# Patient Record
Sex: Female | Born: 1995
Health system: Southern US, Community
[De-identification: ages and names within clinical notes are randomized; demographics above are authoritative.]

## PROBLEM LIST (undated history)

## (undated) ENCOUNTER — Inpatient Hospital Stay (HOSPITAL_COMMUNITY): Payer: Self-pay

## (undated) DIAGNOSIS — F329 Major depressive disorder, single episode, unspecified: Secondary | ICD-10-CM

## (undated) DIAGNOSIS — F32A Depression, unspecified: Secondary | ICD-10-CM

## (undated) DIAGNOSIS — N390 Urinary tract infection, site not specified: Secondary | ICD-10-CM

## (undated) DIAGNOSIS — F419 Anxiety disorder, unspecified: Secondary | ICD-10-CM

## (undated) DIAGNOSIS — Z87448 Personal history of other diseases of urinary system: Secondary | ICD-10-CM

## (undated) DIAGNOSIS — E669 Obesity, unspecified: Secondary | ICD-10-CM

## (undated) DIAGNOSIS — E079 Disorder of thyroid, unspecified: Secondary | ICD-10-CM

## (undated) DIAGNOSIS — U071 COVID-19: Secondary | ICD-10-CM

## (undated) HISTORY — DX: Personal history of other diseases of urinary system: Z87.448

## (undated) HISTORY — DX: Anxiety disorder, unspecified: F41.9

## (undated) HISTORY — DX: Depression, unspecified: F32.A

## (undated) HISTORY — PX: NO PAST SURGERIES: SHX2092

## (undated) HISTORY — DX: Obesity, unspecified: E66.9

## (undated) HISTORY — PX: ORAL MUCOCELE EXCISION: SHX2111

## (undated) HISTORY — DX: Urinary tract infection, site not specified: N39.0

## (undated) HISTORY — DX: Disorder of thyroid, unspecified: E07.9

## (undated) HISTORY — DX: Major depressive disorder, single episode, unspecified: F32.9

## (undated) HISTORY — DX: COVID-19: U07.1

---
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
## 2017-03-20 DIAGNOSIS — Z720 Tobacco use: Secondary | ICD-10-CM | POA: Insufficient documentation

## 2018-07-22 ENCOUNTER — Encounter: Payer: Self-pay | Admitting: *Deleted

## 2018-07-23 ENCOUNTER — Other Ambulatory Visit: Payer: Self-pay

## 2018-07-23 ENCOUNTER — Other Ambulatory Visit (HOSPITAL_COMMUNITY)
Admission: RE | Admit: 2018-07-23 | Discharge: 2018-07-23 | Disposition: A | Discharge status: Home / Self Care | Payer: 59 | Source: Ambulatory Visit | Attending: Family Medicine | Admitting: Family Medicine

## 2018-07-23 ENCOUNTER — Ambulatory Visit: Payer: 59 | Admitting: Family Medicine

## 2018-07-23 ENCOUNTER — Encounter: Payer: Self-pay | Admitting: Family Medicine

## 2018-07-23 VITALS — BP 116/70 | HR 93 | Temp 98.1°F | Resp 16 | Ht 63.0 in | Wt 199.0 lb

## 2018-07-23 DIAGNOSIS — E669 Obesity, unspecified: Secondary | ICD-10-CM | POA: Diagnosis not present

## 2018-07-23 DIAGNOSIS — N390 Urinary tract infection, site not specified: Secondary | ICD-10-CM | POA: Diagnosis not present

## 2018-07-23 DIAGNOSIS — F17210 Nicotine dependence, cigarettes, uncomplicated: Secondary | ICD-10-CM | POA: Insufficient documentation

## 2018-07-23 DIAGNOSIS — N760 Acute vaginitis: Secondary | ICD-10-CM | POA: Diagnosis not present

## 2018-07-23 DIAGNOSIS — B9689 Other specified bacterial agents as the cause of diseases classified elsewhere: Secondary | ICD-10-CM

## 2018-07-23 DIAGNOSIS — Z87448 Personal history of other diseases of urinary system: Secondary | ICD-10-CM | POA: Diagnosis not present

## 2018-07-23 DIAGNOSIS — N898 Other specified noninflammatory disorders of vagina: Secondary | ICD-10-CM

## 2018-07-23 HISTORY — DX: Urinary tract infection, site not specified: N39.0

## 2018-07-23 HISTORY — DX: Nicotine dependence, cigarettes, uncomplicated: F17.210

## 2018-07-23 HISTORY — DX: Personal history of other diseases of urinary system: Z87.448

## 2018-07-23 HISTORY — DX: Obesity, unspecified: E66.9

## 2018-07-23 NOTE — Patient Instructions (Signed)
Please return in 1-6 months for your annual complete physical; please come fasting.  I have placed a referral to OB/GYN for further evaluation regarding recurrent vaginal discharge, and female wellness care including contraceptive management and pap smear.   I will release your lab results to you on your MyChart account with further instructions. Please reply with any questions.   We recommend condom use to prevent STIs.   It was a pleasure meeting you today! Thank you for choosing us to meet your healthcare needs! I truly look forward to working with you. If you have any questions or concerns, please send me a message via Mychart or call the office at [phone].   Steps to Quit Smoking  Smoking tobacco can be harmful to your health and can affect almost every organ in your body. Smoking puts you, and those around you, at risk for developing many serious chronic diseases. Quitting smoking is difficult, but it is one of the best things that you can do for your health. It is never too late to quit. What are the benefits of quitting smoking? When you quit smoking, you lower your risk of developing serious diseases and conditions, such as:  Lung cancer or lung disease, such as COPD.  Heart disease.  Stroke.  Heart attack.  Infertility.  Osteoporosis and bone fractures. Additionally, symptoms such as coughing, wheezing, and shortness of breath may get better when you quit. You may also find that you get sick less often because your body is stronger at fighting off colds and infections. If you are pregnant, quitting smoking can help to reduce your chances of having a baby of low birth weight. How do I get ready to quit? When you decide to quit smoking, create a plan to make sure that you are successful. Before you quit:  Pick a date to quit. Set a date within the next two weeks to give you time to prepare.  Write down the reasons why you are quitting. Keep this list in places where you  will see it often, such as on your bathroom mirror or in your car or wallet.  Identify the people, places, things, and activities that make you want to smoke (triggers) and avoid them. Make sure to take these actions: ? Throw away all cigarettes at home, at work, and in your car. ? Throw away smoking accessories, such as Set designerashtrays and lighters. ? Clean your car and make sure to empty the ashtray. ? Clean your home, including curtains and carpets.  Tell your family, friends, and coworkers that you are quitting. Support from your loved ones can make quitting easier.  Talk with your health care provider about your options for quitting smoking.  Find out what treatment options are covered by your health insurance. What strategies can I use to quit smoking? Talk with your healthcare provider about different strategies to quit smoking. Some strategies include:  Quitting smoking altogether instead of gradually lessening how much you smoke over a period of time. Research shows that quitting "cold [place]urkey" is more successful than gradually quitting.  Attending in-person counseling to help you build problem-solving skills. You are more likely to have success in quitting if you attend several counseling sessions. Even short sessions of 10 minutes can be effective.  Finding resources and support systems that can help you to quit smoking and remain smoke-free after you quit. These resources are most helpful when you use them often. They can include: ? Online chats with a Veterinary surgeoncounselor. ? Telephone  quitlines. ? Automotive engineer. ? Support groups or group counseling. ? Text messaging programs. ? Mobile phone applications.  Taking medicines to help you quit smoking. (If you are pregnant or breastfeeding, talk with your health care provider first.) Some medicines contain nicotine and some do not. Both types of medicines help with cravings, but the medicines that include nicotine help to relieve  withdrawal symptoms. Your health care provider may recommend: ? Nicotine patches, gum, or lozenges. ? Nicotine inhalers or sprays. ? Non-nicotine medicine that is taken by mouth. Talk with your health care provider about combining strategies, such as taking medicines while you are also receiving in-person counseling. Using these two strategies together makes you more likely to succeed in quitting than if you used either strategy on its own. If you are pregnant or breastfeeding, talk with your health care provider about finding counseling or other support strategies to quit smoking. Do not take medicine to help you quit smoking unless told to do so by your health care provider. What things can I do to make it easier to quit? Quitting smoking might feel overwhelming at first, but there is a lot that you can do to make it easier. Take these important actions:  Reach out to your family and friends and ask that they support and encourage you during this time. Call telephone quitlines, reach out to support groups, or work with a counselor for support.  Ask people who smoke to avoid smoking around you.  Avoid places that trigger you to smoke, such as bars, parties, or smoke-break areas at work.  Spend time around people who do not smoke.  Lessen stress in your life, because stress can be a smoking trigger for some people. To lessen stress, try: ? Exercising regularly. ? Deep-breathing exercises. ? Yoga. ? Meditating. ? Performing a body scan. This involves closing your eyes, scanning your body from head to toe, and noticing which parts of your body are particularly tense. Purposefully relax the muscles in those areas.  Download or purchase mobile phone or tablet apps (applications) that can help you stick to your quit plan by providing reminders, tips, and encouragement. There are many free apps, such as QuitGuide from the [employer] developer for Disease Control and Prevention). You can find other support  for quitting smoking (smoking cessation) through smokefree.gov and other websites. How will I feel when I quit smoking? Within the first 24 hours of quitting smoking, you may start to feel some withdrawal symptoms. These symptoms are usually most noticeable 2-3 days after quitting, but they usually do not last beyond 2-3 weeks. Changes or symptoms that you might experience include:  Mood swings.  Restlessness, anxiety, or irritation.  Difficulty concentrating.  Dizziness.  Strong cravings for sugary foods in addition to nicotine.  Mild weight gain.  Constipation.  Nausea.  Coughing or a sore throat.  Changes in how your medicines work in your body.  A depressed mood.  Difficulty sleeping (insomnia). After the first 2-3 weeks of quitting, you may start to notice more positive results, such as:  Improved sense of smell and taste.  Decreased coughing and sore throat.  Slower heart rate.  Lower blood pressure.  Clearer skin.  The ability to breathe more easily.  Fewer sick days. Quitting smoking is very challenging for most people. Do not get discouraged if you are not successful the first time. Some people need to make many attempts to quit before they achieve long-term success. Do your best to stick to  your quit plan, and talk with your health care provider if you have any questions or concerns. This information is not intended to replace advice given to you by your health care provider. Make sure you discuss any questions you have with your health care provider. Document Released: 05/28/2001 Document Revised: 01/07/2017 Document Reviewed: 10/18/2014 Elsevier Interactive Patient Education  2019 [employer]

## 2018-07-23 NOTE — Progress Notes (Signed)
Subjective  CC:  Chief Complaint  Patient presents with  . Establish Care    HPI: Barbara Lutz is a 23 y.o. female who presents to [place] at [facility] today to establish care with me as a new patient.  Previously was treated by peds and OB/GYN. I reviewed those records.   She has the following concerns or needs:  Recurrent vaginitis and h/o frequent UTIs. Reports had BV treated by GYN - multiple episodes over the last year. Last treated in July. No documented STIs last year but reports being told she had trich in the distant past although she is not certain that is the correct dx. She has had neg STD testing in the last 6 months. C/o several week h/o increase in vaginal discharge with odor. No itching. Mild urinary sxs. One sexual partner, no condoms. On birth control. No pelvic pain, fevers, or h/o PID.   Smoker not interested in quitting right now.   Due for HM / cpe.   Assessment  1. Vaginal discharge   2. Recurrent UTI   3. History of pyelonephritis   4. Nicotine dependence, cigarettes, uncomplicated   5. Obesity (BMI 30-39.9)   6. Bacterial vaginitis      Plan   H/o recurrent BV with BV sxs now: check vaginal swab for trich, yeast and BV, GC/chl. Treat if positive. Refer to GYN   Pt request [facility] in [place] due to location. Safe sex discussed.   R/o UTI although sxs c/w vaginitis.   Smoking: will work on cessation counseling. See avs.   Obesity: has been to bariatric and nutritional counseling: "has tried everything" currently seeing private obesity clinic.   Follow up:  Return in about 3 months (around 10/21/2018) for complete physical. Orders Placed This Encounter  Procedures  . Ambulatory referral to Obstetrics / Gynecology   No orders of the defined types were placed in this encounter.    No flowsheet data found.  We updated and reviewed the patient's past history in detail and it is documented below.  Patient Active  Problem List   Diagnosis Date Noted  . Recurrent UTI 07/23/2018  . History of pyelonephritis 07/23/2018  . Nicotine dependence, cigarettes, uncomplicated 60/60/0459  . Obesity (BMI 30-39.9) 07/23/2018    [facility] bariatric counseling   . Tobacco abuse 03/20/2017  . Depression, major, recurrent, moderate ([place]) 05/25/2012   Health Maintenance  Topic Date Due  . HIV Screening  01/30/2011  . PAP-Cervical Cytology Screening  03/18/2019  . PAP SMEAR-Modifier  03/18/2019  . TETANUS/TDAP  03/19/2027  . INFLUENZA VACCINE  Completed   Immunization History  Administered Date(s) Administered  . DTaP 03/19/1996, 05/17/1996, 08/03/1996, 08/19/1997, 09/25/2000  . DTaP / HiB 03/19/1996, 05/17/1996, 08/03/1996, 02/01/1997  . HPV Quadrivalent 03/30/2009, 03/07/2010, 07/03/2010  . Hepatitis A, Ped/Adol-2 Dose 03/10/2008, 03/30/2009  . Hepatitis B, ped/adol Feb 25, 1996, 03/19/1996, 11/25/1996  . IPV 03/19/1996, 05/17/1996, 08/19/1997, 09/25/2000  . Influenza Inj Mdck Quad Pf 02/15/2018  . Influenza, Seasonal, Injecte, Preservative Fre 04/19/2016  . Influenza,inj,Quad PF,6+ Mos 04/19/2016, 04/19/2016, 03/18/2017  . Influenza-Unspecified 03/10/2008, 03/30/2009, 03/07/2010  . MMR 02/01/1997, 09/25/2000  . Meningococcal Conjugate 03/10/2008, 02/14/2014  . Pneumococcal-Unspecified 07/09/1999  . Td 02/06/2007, 03/18/2017  . Tdap 02/06/2007  . Varicella 02/01/1997, 03/10/2008   Current Meds  Medication Sig  . Chorionic Gonadotropin (HCG IJ) Inject as directed.  . Norgestimate-Ethinyl Estradiol Triphasic (TRI-ESTARYLLA) 0.18/0.215/0.25 MG-35 MCG tablet Take by mouth.  . phentermine 37.5 MG capsule Take 37.5 mg by mouth  every morning.    Allergies: Patient is allergic to latex and nickel. Past Medical History Patient  has a past medical history of Anxiety, Depression, History of pyelonephritis (07/23/2018), Obesity (BMI 30-39.9) (07/23/2018), and Recurrent UTI (07/23/2018). Past Surgical History Patient  has  no past surgical history on file. Family History: Patient family history includes ADD / ADHD in her paternal grandfather; Alcohol abuse in her paternal grandfather; Arthritis in her paternal grandmother; Asthma in her sister; Breast cancer in her maternal grandmother; COPD in her paternal grandfather; Depression in her sister and sister; Diabetes in her maternal grandfather and paternal grandfather; Drug abuse in her sister; Hearing loss in her maternal grandfather and paternal grandfather; Heart attack in her father and maternal grandfather; Heart disease in her maternal grandfather; High Cholesterol in her father; Hyperlipidemia in her maternal grandfather and paternal grandfather; Hypertension in her maternal grandfather, paternal grandfather, and paternal grandmother; Pancreatic cancer in her paternal grandmother; Stroke in her paternal grandfather. Social History:  Patient  reports that she has never smoked. She has never used smokeless tobacco. She reports that she does not drink alcohol or use drugs.  Review of Systems: Constitutional: negative for fever or malaise Ophthalmic: negative for photophobia, double vision or loss of vision Cardiovascular: negative for chest pain, dyspnea on exertion, or new LE swelling Respiratory: negative for SOB or persistent cough Gastrointestinal: negative for abdominal pain, change in bowel habits or melena Genitourinary: negative for dysuria or gross hematuria Musculoskeletal: negative for new gait disturbance or muscular weakness Integumentary: negative for new or persistent rashes Neurological: negative for TIA or stroke symptoms Psychiatric: negative for SI or delusions Allergic/Immunologic: negative for hives  Patient Care Team    Relationship Specialty Notifications Start End  Leamon Arnt, MD PCP - General Family Medicine  07/23/18 07/23/18    Objective  Vitals: BP 116/70   Pulse 93   Temp 98.1 F (36.7 C) (Oral)   Resp 16   Ht '5\' 3"'  (1.6  m)   Wt 199 lb (90.3 kg)   LMP 07/09/2018   SpO2 97%   BMI 35.25 kg/m  General:  Well developed, well nourished, no acute distress  Psych:  Alert and oriented,normal mood and affect HEENT:  Normocephalic, atraumatic, non-icteric sclera, PERRL, oropharynx is without mass or exudate, supple neck without adenopathy, mass or thyromegaly Cardiovascular:  RRR without gallop, rub or murmur, nondisplaced PMI Respiratory:  Good breath sounds bilaterally, CTAB with normal respiratory effort Gastrointestinal: normal bowel sounds, soft, non-tender, no noted masses. No HSM, no suprapubic ttp MSK: no deformities, contusions. Joints are without erythema or swelling Skin:  Warm, no rashes or suspicious lesions noted Neurologic:    Mental status is normal. Gross motor and sensory exams are normal. Normal gait   Commons side effects, risks, benefits, and alternatives for medications and treatment plan prescribed today were discussed, and the patient expressed understanding of the given instructions. Patient is instructed to call or message via MyChart if he/she has any questions or concerns regarding our treatment plan. No barriers to understanding were identified. We discussed Red Flag symptoms and signs in detail. Patient expressed understanding regarding what to do in case of urgent or emergency type symptoms.   Medication list was reconciled, printed and provided to the patient in AVS. Patient instructions and summary information was reviewed with the patient as documented in the AVS. This note was prepared with assistance of Dragon voice recognition software. Occasional wrong-word or sound-a-like substitutions may have occurred due to the inherent  limitations of voice recognition software

## 2018-07-26 LAB — CERVICOVAGINAL ANCILLARY ONLY
Bacterial vaginitis: NEGATIVE
CANDIDA VAGINITIS: NEGATIVE
Chlamydia: NEGATIVE
NEISSERIA GONORRHEA: NEGATIVE
TRICH (WINDOWPATH): NEGATIVE

## 2018-07-27 ENCOUNTER — Encounter: Payer: Self-pay | Admitting: *Deleted

## 2018-07-27 NOTE — Progress Notes (Signed)
Please call patient: I have reviewed his/her lab results. All vaginal testing for STIs and BV and yeast were negative. No infection identified. See GYN as discussed. No medications needed at this time.

## 2018-08-06 DIAGNOSIS — R002 Palpitations: Secondary | ICD-10-CM | POA: Diagnosis not present

## 2018-08-06 DIAGNOSIS — E041 Nontoxic single thyroid nodule: Secondary | ICD-10-CM | POA: Diagnosis not present

## 2018-08-06 DIAGNOSIS — R079 Chest pain, unspecified: Secondary | ICD-10-CM | POA: Diagnosis not present

## 2018-08-28 ENCOUNTER — Encounter: Payer: 59 | Admitting: Family Medicine

## 2018-09-10 ENCOUNTER — Encounter: Payer: 59 | Admitting: Family Medicine

## 2018-10-26 ENCOUNTER — Ambulatory Visit: Payer: 59 | Admitting: Family Medicine

## 2018-12-10 DIAGNOSIS — Z792 Long term (current) use of antibiotics: Secondary | ICD-10-CM | POA: Diagnosis not present

## 2018-12-10 DIAGNOSIS — J020 Streptococcal pharyngitis: Secondary | ICD-10-CM | POA: Diagnosis not present

## 2019-01-22 ENCOUNTER — Encounter: Payer: 59 | Admitting: Family Medicine

## 2019-03-22 DIAGNOSIS — Z23 Encounter for immunization: Secondary | ICD-10-CM | POA: Diagnosis not present

## 2019-03-22 DIAGNOSIS — J029 Acute pharyngitis, unspecified: Secondary | ICD-10-CM | POA: Diagnosis not present

## 2019-03-22 DIAGNOSIS — J0100 Acute maxillary sinusitis, unspecified: Secondary | ICD-10-CM | POA: Diagnosis not present

## 2019-03-24 ENCOUNTER — Other Ambulatory Visit: Payer: Self-pay

## 2019-03-24 DIAGNOSIS — Z20822 Contact with and (suspected) exposure to covid-19: Secondary | ICD-10-CM

## 2019-03-24 DIAGNOSIS — Z20828 Contact with and (suspected) exposure to other viral communicable diseases: Secondary | ICD-10-CM | POA: Diagnosis not present

## 2019-03-25 LAB — NOVEL CORONAVIRUS, NAA: SARS-CoV-2, NAA: NOT DETECTED

## 2019-07-18 DIAGNOSIS — Z1152 Encounter for screening for COVID-19: Secondary | ICD-10-CM | POA: Diagnosis not present

## 2019-07-20 DIAGNOSIS — J029 Acute pharyngitis, unspecified: Secondary | ICD-10-CM | POA: Diagnosis not present

## 2019-07-21 ENCOUNTER — Ambulatory Visit (INDEPENDENT_AMBULATORY_CARE_PROVIDER_SITE_OTHER): Payer: 59 | Admitting: Family Medicine

## 2019-07-21 ENCOUNTER — Encounter: Payer: Self-pay | Admitting: Family Medicine

## 2019-07-21 ENCOUNTER — Other Ambulatory Visit: Payer: Self-pay

## 2019-07-21 VITALS — Ht 63.0 in | Wt 199.0 lb

## 2019-07-21 DIAGNOSIS — J029 Acute pharyngitis, unspecified: Secondary | ICD-10-CM | POA: Diagnosis not present

## 2019-07-21 MED ORDER — PENICILLIN V POTASSIUM 500 MG PO TABS: 500.0000 mg | ORAL_TABLET | Freq: Two times a day (BID) | ORAL | 0 refills | Status: DC

## 2019-07-21 NOTE — Progress Notes (Signed)
Virtual Visit via Video Note  Subjective  CC:  Chief Complaint  Patient presents with  . Sore Throat    Sx started Sunday 07/18/2019, throat is red/ white patches. Negative test Sundy at [facility]. 101 fever  . Cough  . Fever     I connected with [facility] on 07/21/19 at 10:00 AM EST by a video enabled telemedicine application and verified that I am speaking with the correct person using two identifiers. Location patient: Home Location provider: [facility] [address], Office Persons participating in the virtual visit: Shaylon Gillean, Willow Ora, MD Gracy Racer, [place]  I discussed the limitations of evaluation and management by telemedicine and the availability of in person appointments. The patient expressed understanding and agreed to proceed. HPI: Barbara Lutz is a 24 y.o. female who was contacted today to address the problems listed above in the chief complaint. . Reviewed chart: seen at [facility] yesterday. Negative rapid strep at that time. 3 days prior had negative covid test done at [facility] urgent care. This may have been a rapid test. She has not had the NAA test done. . C/o low grade fevers, severe sore throat x 4 days w/o cough, loss of taste or smell, GI sxs or myalgias. No known covid exposures. Had had strep recurrent in past and feels the same. Using tylenol for pain w/o relief.   Assessment  1. Acute pharyngitis, unspecified etiology      Plan   ST:  ? False neg rapid strep vs viral etiology. Treat with advil 3/tid and supportive care. OOW note sent in mychart. Empiric tx w/ pcn. F/u if not improving. Self isolation until improved I discussed the assessment and treatment plan with the patient. The patient was provided an opportunity to ask questions and all were answered. The patient agreed with the plan and demonstrated an understanding of the instructions.   The patient was advised to call back or seek an in-person evaluation if  the symptoms worsen or if the condition fails to improve as anticipated. Follow up: Return for complete physical.  Visit date not found  Meds ordered this encounter  Medications  . penicillin v potassium (VEETID) 500 MG tablet    Sig: Take 1 tablet (500 mg total) by mouth 2 (two) times daily.    Dispense:  20 tablet    Refill:  0      I reviewed the patients updated PMH, FH, and SocHx.    Patient Active Problem List   Diagnosis Date Noted  . Recurrent UTI 07/23/2018  . History of pyelonephritis 07/23/2018  . Nicotine dependence, cigarettes, uncomplicated 07/23/2018  . Obesity (BMI 30-39.9) 07/23/2018  . Tobacco abuse 03/20/2017  . Depression, major, recurrent, moderate ([facility]) 05/25/2012   No outpatient medications have been marked as taking for the 07/21/19 encounter (Office Visit) with Willow Ora, MD.    Allergies: Patient is allergic to latex and nickel. Family History: Patient family history includes ADD / ADHD in her paternal grandfather; Alcohol abuse in her paternal grandfather; Arthritis in her paternal grandmother; Asthma in her sister; Breast cancer in her maternal grandmother; COPD in her paternal grandfather; Depression in her sister and sister; Diabetes in her maternal grandfather and paternal grandfather; Drug abuse in her sister; Hearing loss in her maternal grandfather and paternal grandfather; Heart attack in her father and maternal grandfather; Heart disease in her maternal grandfather; High Cholesterol in her father; Hyperlipidemia in her maternal grandfather and  paternal grandfather; Hypertension in her maternal grandfather, paternal grandfather, and paternal grandmother; Pancreatic cancer in her paternal grandmother; Stroke in her paternal grandfather. Social History:  Patient  reports that she has never smoked. She has never used smokeless tobacco. She reports that she does not drink alcohol or use drugs.  Review of Systems: Constitutional: Negative for fever  malaise or anorexia Cardiovascular: negative for chest pain Respiratory: negative for SOB or persistent cough Gastrointestinal: negative for abdominal pain  OBJECTIVE Vitals: Ht 5\' 3"  (1.6 m)   Wt 199 lb (90.3 kg)   LMP 07/16/2019   BMI 35.25 kg/m  General: no acute distress , A&Ox3 Hoarse but no respiratory distress. Appears painful to swallow  Leamon Arnt, MD

## 2019-07-21 NOTE — Patient Instructions (Signed)
Please return for your annual complete physical; please come fasting.   If you have any questions or concerns, please don't hesitate to send me a message via MyChart or call the office at [phone]. Thank you for visiting with [place] today! It's our pleasure caring for you.   Strep Throat, Adult Strep throat is an infection in the throat that is caused by bacteria. It is common during the cold months of the year. It mostly affects children who are 71-108 years old. However, people of all ages can get it at any time of the year. This infection spreads from person to person (is contagious) through coughing, sneezing, or having close contact. Your health care provider may use other names to describe the infection. It can be called tonsillitis (if there is swelling of the tonsils), or pharyngitis (if there is swelling at the back of the throat). What are the causes? This condition is caused by the Streptococcus pyogenes bacteria. What increases the risk? You are more likely to develop this condition if:  You care for school-age children, or are around school-age children. Children are more likely to get strep throat and may spread it to others.  You spend time in crowded places where the infection can spread easily.  You have close contact with someone who has strep throat. What are the signs or symptoms? Symptoms of this condition include:  Fever or chills.  Redness, swelling, or pain in the tonsils or throat.  Pain or difficulty when swallowing.  White or yellow spots on the tonsils or throat.  Tender glands in the neck and under the jaw.  Bad smelling breath.  Red rash all over the body. This is rare. How is this diagnosed? This condition is diagnosed by tests that check for the presence and the amount of bacteria that cause strep throat. They are:  Rapid strep test. Your throat is swabbed and checked for the presence of bacteria. Results are usually ready in minutes.  Throat  culture test. Your throat is swabbed. The sample is placed in a cup that allows infections to grow. Results are usually ready in 1 or 2 days. How is this treated? This condition may be treated with:  Medicines that kill germs (antibiotics).  Medicines that relieve pain or fever. These include: ? Ibuprofen or acetaminophen. ? Aspirin, only for patients who are over the age of 67. ? Throat lozenges. ? Throat sprays. Follow these instructions at home: Medicines   Take over-the-counter and prescription medicines only as told by your health care provider.  Take your antibiotic medicine as told by your health care provider. Do not stop taking the antibiotic even if you start to feel better. Eating and drinking   If you have trouble swallowing, try eating soft foods until your sore throat feels better.  Drink enough fluid to keep your urine pale yellow.  To help relieve pain, you may have: ? Warm fluids, such as soup and tea. ? Cold fluids, such as frozen desserts or popsicles. General instructions  Gargle with a salt-water mixture 3-4 times a day or as needed. To make a salt-water mixture, completely dissolve -1 tsp (3-6 g) of salt in 1 cup (237 mL) of warm water.  Get plenty of rest.  Stay home from work or school until you have been taking antibiotics for 24 hours.  Avoid smoking or being around people who smoke.  Keep all follow-up visits as told by your health care provider. This is important. How is this  prevented?   Do not share food, drinking cups, or personal items that could cause the infection to spread to other people.  Wash your hands well with soap and water, and make sure that all people in your house wash their hands well.  Have family members tested if they have a sore throat or fever. They may need an antibiotic if they have strep throat. Contact a health care provider if:  The glands in your neck continue to get bigger.  You develop a rash, cough, or  earache.  You cough up a thick mucus that is green, yellow-brown, or bloody.  You have pain or discomfort that does not get better with medicine.  Your symptoms seem to be getting worse and not better.  You have a fever. Get help right away if:  You have new symptoms, such as vomiting, severe headache, stiff or painful neck, chest pain, or shortness of breath.  You have severe throat pain, drooling, or changes in your voice.  You have swelling of the neck, or the skin on the neck becomes red and tender.  You have signs of dehydration, such as tiredness (fatigue), dry mouth, and decreased urination.  You become increasingly sleepy, or you cannot wake up completely.  Your joints become red or painful. Summary  Strep throat is an infection in the throat that is caused by the Streptococcus pyogenes bacteria. This infection is spread from person to person (is contagious) through coughing, sneezing, or having close contact.  Take your medicines, including antibiotics, as told by your health care provider. Do not stop taking the antibiotic even if you start to feel better.  To prevent the spread of germs, wash your hands well with soap and water. Have others do the same. Do not share food, drinking cups, or personal items.  Get help right away if you have new symptoms, such as vomiting, severe headache, stiff or painful neck, chest pain, or shortness of breath. This information is not intended to replace advice given to you by your health care provider. Make sure you discuss any questions you have with your health care provider. Document Revised: 08/21/2018 Document Reviewed: 08/21/2018 Elsevier Patient Education  [employer] [place].

## 2019-07-22 ENCOUNTER — Ambulatory Visit: Payer: 59 | Admitting: Family Medicine

## 2019-10-12 ENCOUNTER — Telehealth: Payer: Self-pay

## 2019-10-12 NOTE — Telephone Encounter (Signed)
Patient requesting to transfer from Dr. Mardelle Matte to Dr. Claiborne Billings for scheduling purposes.

## 2019-10-12 NOTE — Telephone Encounter (Signed)
Ok with me, if approved by Dr. Mardelle Matte.

## 2019-10-18 NOTE — Telephone Encounter (Signed)
Ok with me. Thanks.  

## 2019-10-28 ENCOUNTER — Encounter: Payer: Self-pay | Admitting: Family Medicine

## 2019-10-29 ENCOUNTER — Other Ambulatory Visit: Payer: Self-pay

## 2019-10-29 ENCOUNTER — Ambulatory Visit: Payer: 59 | Admitting: Family Medicine

## 2019-10-29 ENCOUNTER — Encounter: Payer: Self-pay | Admitting: Family Medicine

## 2019-10-29 VITALS — BP 122/80 | HR 70 | Temp 98.1°F | Resp 18 | Ht 62.75 in | Wt 194.5 lb

## 2019-10-29 DIAGNOSIS — K130 Diseases of lips: Secondary | ICD-10-CM

## 2019-10-29 MED ORDER — FLUCONAZOLE 150 MG PO TABS: 150.0000 mg | ORAL_TABLET | Freq: Once | ORAL | 0 refills | Status: AC

## 2019-10-29 MED ORDER — CHLORHEXIDINE GLUCONATE 0.12 % MT SOLN: 15.0000 mL | Freq: Two times a day (BID) | OROMUCOSAL | 1 refills | Status: DC

## 2019-10-29 MED ORDER — AMOXICILLIN-POT CLAVULANATE 875-125 MG PO TABS: 1.0000 | ORAL_TABLET | Freq: Two times a day (BID) | ORAL | 0 refills | Status: DC

## 2019-10-29 NOTE — Patient Instructions (Signed)
Start augmentin every 12 hours for 5 days. I call in diflucan 1 tab, incase you get a yeast infection from using abx.  Start Peridex solution rinses 2-3 times a day.  Keep ice over area for 15 minutes at a time- then 15 minutes no ice for the next couple hrs to try to keep swelling down. You probably will swell and maybe even bruise some from the squeezing on your lip today.  You Mucocele of the Mouth A mucocele is a growth or bump (cyst) that is filled with mucus. A mucocele can form on many parts of the mouth, including the gums, the tongue, and the inside of the cheeks. A common spot is inside the lower lip. Mucoceles are not dangerous, and they are usually not painful. A mucocele can be uncomfortable if it is very large or if it is located under the tongue. Small mucoceles often clear up on their own. Treatment may not be needed. Mucoceles that are large or that keep coming back might need to be removed. What are the causes? Mucoceles form when the salivary ducts in the mouth are damaged and leak saliva. These ducts carry saliva from the salivary glands to the surface of the mouth. A mucocele can develop because of:  An injury to your mouth.  Sucking or biting on your lips or tongue.  A blocked salivary duct. This is sometimes caused by swelling.  Piercing of the tongue or lip for jewelry. In some cases, the cause may not be known. What are the signs or symptoms? Symptoms of this condition include a smooth, painless bumpinside your mouth. The bump may:  Show up all of a sudden.  Have thin walls and a bluish color.  Change in size. Most are smaller than  inch (1.3 cm). Mucoceles under the tongue are called ranulas. These may be bigger and may push your tongue up and back. In some cases, this can make it hard to talk, swallow, or breathe. How is this diagnosed? This condition is usually diagnosed with a physical exam. Your health care provider will often be able to tell if you have a  mucocele by looking at it and feeling it. You may also have tests, such as:  An ultrasound to check for problems with your salivary gland.  An X-ray to see if stones are blocking the exit of saliva. How is this treated? Treatment may depend on the size of the mucocele:  For a small mucocele, treatment is usually not needed. It will drain on its own and go away.  For larger mucoceles and ranulas, surgery may be needed. This may be done if the mucocele does not go away or if it keeps coming back. The entire mucocele may be taken out. In some cases, the salivary gland may also be removed. Follow these instructions at home:  Take over-the-counter and prescription medicines only as told by your health care provider.  Do not try to drain a mucocele on your own. Do not poke a hole in it.  Do not use any products that contain nicotine or tobacco, such as cigarettes and e-cigarettes. If you need help quitting, ask your health care provider.  Do not suck or bite on your lips or tongue.  If your mucocele was removed, avoid hard, sharp, or spicy foods and foods that have high acidity while your mouth is healing.  Keep all follow-up visits as told by your health care provider. This is important. Contact a health care provider if:  You have a lump or cyst inside your mouth that does not go away.  You have a fever. Get help right away if:  You have a lump or cyst in your mouth that: ? Becomes painful. ? Gets large very fast.  You have a lump or cyst in your mouth that makes it hard to: ? Swallow. ? Talk. ? Breathe. Summary  A mucocele is a growth or bump (cyst) that is filled with mucus. A mucocele can form on many parts of the mouth.  Mucoceles are usually not painful. A mucocele can be uncomfortable if it is very large or if it is located under the tongue.  For a small mucocele, treatment is usually not needed. It will drain on its own and go away.  Larger mucoceles may need to be  surgically removed.  Do not try to drain a mucocele on your own. Do not poke a hole in it. This information is not intended to replace advice given to you by your health care provider. Make sure you discuss any questions you have with your health care provider. Document Revised: 10/15/2017 Document Reviewed: 10/15/2017 Elsevier Patient Education  [employer].  can take advil if needed for discomfort. I dont think you will need though.   This was a mucous cyst from biting your lip.

## 2019-10-29 NOTE — Progress Notes (Signed)
Patient ID: Barbara Lutz, female  DOB: January 15, 1996, 24 y.o.   MRN: [id number] Patient Care Team    Relationship Specialty Notifications Start End  Ma Hillock, DO PCP - General Family Medicine  10/29/19     Chief Complaint  Patient presents with  . Establish Care    Prior PCP Dr Jonni Sanger.   . Cyst    Inside of lip on R side. Been there for 6 months. Tender but not painful.     Subjective: Barbara Lutz is a 24 y.o.  female present for new patient establishment/TOC. All past medical history, surgical history, allergies, family history, immunizations, medications and social history were updated in the electronic medical record today. All recent labs, ED visits and hospitalizations within the last year were reviewed.  Cyst: Patient presents to the office today for patient transfer with complaints of a cyst on her lower lip.  She reports it has been present for about 6 months.  The cyst has become larger over the last 1-2 months.  She states it is not tender.  It has not drained.  She endorses biting her lip frequently secondary to stress.  She denies fever, chills, erythema or drainage at location.  No flowsheet data found. No flowsheet data found.      Fall Risk  07/23/2018  Falls in the past year? 0  Number falls in past yr: 0  Injury with Fall? 0  Follow up Falls evaluation completed     Immunization History  Administered Date(s) Administered  . DTaP 03/19/1996, 05/17/1996, 08/03/1996, 08/19/1997, 09/25/2000  . DTaP / HiB 03/19/1996, 05/17/1996, 08/03/1996, 02/01/1997  . HPV Quadrivalent 03/30/2009, 03/07/2010, 07/03/2010  . Hepatitis A, Ped/Adol-2 Dose 03/10/2008, 03/30/2009  . Hepatitis B, ped/adol 12[birth year]25-95, 03/19/1996, 11/25/1996  . IPV 03/19/1996, 05/17/1996, 08/19/1997, 09/25/2000  . Influenza Inj Mdck Quad Pf 02/15/2018  . Influenza, Seasonal, Injecte, Preservative Fre 04/19/2016  . Influenza,inj,Quad PF,6+ Mos 04/19/2016, 04/19/2016, 03/18/2017, 03/22/2019   . Influenza,inj,quad, With Preservative 03/10/2008, 03/30/2009, 03/07/2010  . Influenza-Unspecified 03/10/2008, 03/30/2009, 03/07/2010  . MMR 02/01/1997, 09/25/2000  . Meningococcal Conjugate 03/10/2008, 02/14/2014  . Pneumococcal-Unspecified 07/09/1999  . Td 02/06/2007, 03/18/2017  . Tdap 02/06/2007  . Varicella 02/01/1997, 03/10/2008    No exam data present  Past Medical History:  Diagnosis Date  . Anxiety   . Depression   . History of pyelonephritis 07/23/2018  . Obesity (BMI 30-39.9) 07/23/2018   [facility]  . Recurrent UTI 07/23/2018  . Thyroid disorder    Allergies  Allergen Reactions  . Latex Rash  . Nickel Dermatitis   Past Surgical History:  Procedure Laterality Date  . NO PAST SURGERIES     Family History  Problem Relation Age of Onset  . COPD Mother   . Hypertension Mother   . Heart attack Father   . High Cholesterol Father   . Depression Sister   . Diabetes Maternal Grandfather   . Hearing loss Maternal Grandfather   . Heart attack Maternal Grandfather   . Heart disease Maternal Grandfather   . Hypertension Maternal Grandfather   . Hyperlipidemia Maternal Grandfather   . ADD / ADHD Paternal Grandfather   . Alcohol abuse Paternal Grandfather   . COPD Paternal Grandfather   . Diabetes Paternal Grandfather   . Hearing loss Paternal Grandfather   . Hyperlipidemia Paternal Grandfather   . Hypertension Paternal Grandfather   . Stroke Paternal Grandfather   . Asthma Sister   . Depression Sister   . Drug abuse  Sister   . Breast cancer Maternal Grandmother   . Pancreatic cancer Paternal Grandmother   . Arthritis Paternal Grandmother   . Hypertension Paternal Grandmother    Social History   Social History Narrative   Marital status/children/pets: Single.   Education/employment: GED.  Employed. Manager.   Safety:      -smoke alarm in the home:Yes     - wears seatbelt: Yes     - Feels safe in their relationships: Yes    Allergies as of  10/29/2019      Reactions   Latex Rash   Nickel Dermatitis      Medication List       Accurate as of Oct 29, 2019 11:59 PM. If you have any questions, ask your nurse or doctor.        STOP taking these medications   penicillin v potassium 500 MG tablet Commonly known as: VEETID Stopped by: Howard Pouch, DO     TAKE these medications   amoxicillin-clavulanate 875-125 MG tablet Commonly known as: AUGMENTIN Take 1 tablet by mouth 2 (two) times daily. Started by: Howard Pouch, DO   chlorhexidine 0.12 % solution Commonly known as: PERIDEX Use as directed 15 mLs in the mouth or throat 2 (two) times daily. Started by: Howard Pouch, DO   fluconazole 150 MG tablet Commonly known as: DIFLUCAN Take 1 tablet (150 mg total) by mouth once for 1 dose. Started by: Howard Pouch, DO   HCG IJ Inject as directed.   phentermine 37.5 MG capsule Take 37.5 mg by mouth every morning.   Tri-Estarylla 0.18/0.215/0.25 MG-35 MCG tablet Generic drug: Norgestimate-Ethinyl Estradiol Triphasic Take by mouth.       All past medical history, surgical history, allergies, family history, immunizations andmedications were updated in the EMR today and reviewed under the history and medication portions of their EMR.    No results found for this or any previous visit (from the past 2160 hour(s)).  No results found.   ROS: 14 pt review of systems performed and negative (unless mentioned in an HPI)  Objective: BP 122/80 (BP Location: Left Arm, Patient Position: Sitting, Cuff Size: Normal)   Pulse 70   Temp 98.1 F (36.7 C) (Temporal)   Resp 18   Ht 5' 2.75" (1.594 m)   Wt 194 lb 8 oz (88.2 kg)   LMP 10/02/2019 (Exact Date)   SpO2 100%   BMI 34.73 kg/m  Gen: Afebrile. No acute distress. Nontoxic in appearance, well-developed, well-nourished, pleasant female HENT: AT. [place]. MMM.  Swelling present of right side of lower lip.  Approximately 1 cm round, fluid-filled, slightly blue tinge cystic  lesion in her right lower lip.  No erythema.  No drainage. Eyes:Pupils Equal Round Reactive to light, Extraocular movements intact,  Conjunctiva without redness, discharge or icterus. Neck/lymp/endocrine: Supple, no lymphadenopathy  Assessment/plan: Barbara Lutz is a 24 y.o. female present for transfer of care Mucocele of lower lip 1 cm mucocele right lower inner lip.  Does not appear infected today.  Discussed possible treatment plans which included continue to monitor versus I&D versus referral to surgical resection. Patient did elect to have I&D completed in the office today. Procedure Note:  PROCEDURE NOTE: Incision and Drainage Performed by: Dr. Raoul Pitch Indication: Right lower lip mucocele Patient was provided with 1 mL of peroxide to swish and spit to help cleanse location of mucocele.  Sterile 18-gauge needle was then inserted into cyst increasing drainage, then removed.  Gentle consistent pressure was applied to drain  thick clear mucus from cyst ~ 1cc.  Patient then switched in mL of peroxide after procedure, followed by swishing with water.  [place] placed on inner lip, with ice pack in external pressure applied.  Patient was instructed to keep ice pack on for 10-15 minutes at a time, alternating with 15 minutes off x2.  The patient tolerated the procedure well. Wound care instructions were given.  Post-Procedure Diagnosis: Lower lip mucocele  Complications: None Estimated Blood Loss: 0 mL -Chlorhexidine mouth rinse twice daily -Augmentin twice daily 5 days; Diflucan x1 if needed. Follow up only necessary if redness, puslike drainage or fever occurs.  Patient was counseled Mucoele may return.  If so surgical resection would be indicated.  Refraining from biting her lip encouraged.  Return if symptoms worsen or fail to improve.  No orders of the defined types were placed in this encounter.  Meds ordered this encounter  Medications  . chlorhexidine (PERIDEX) 0.12 % solution     Sig: Use as directed 15 mLs in the mouth or throat 2 (two) times daily.    Dispense:  120 mL    Refill:  1  . amoxicillin-clavulanate (AUGMENTIN) 875-125 MG tablet    Sig: Take 1 tablet by mouth 2 (two) times daily.    Dispense:  10 tablet    Refill:  0  . fluconazole (DIFLUCAN) 150 MG tablet    Sig: Take 1 tablet (150 mg total) by mouth once for 1 dose.    Dispense:  1 tablet    Refill:  0   Referral Orders  No referral(s) requested today     Note is dictated utilizing voice recognition software. Although note has been proof read prior to signing, occasional typographical errors still can be missed. If any questions arise, please do not hesitate to call for verification.  Electronically signed by: Howard Pouch, DO [place]

## 2019-11-01 ENCOUNTER — Encounter: Payer: Self-pay | Admitting: Family Medicine

## 2019-11-17 ENCOUNTER — Telehealth: Payer: Self-pay

## 2019-11-17 NOTE — Telephone Encounter (Signed)
Pt called and stated that cyst was back on R jaw line. She does think it is bigger in size. Appt was made for pt to get possible surgical referral.

## 2019-11-18 ENCOUNTER — Other Ambulatory Visit: Payer: Self-pay

## 2019-11-18 ENCOUNTER — Ambulatory Visit: Payer: 59 | Admitting: Family Medicine

## 2019-11-18 ENCOUNTER — Encounter: Payer: Self-pay | Admitting: Family Medicine

## 2019-11-18 VITALS — BP 103/71 | HR 82 | Temp 97.7°F | Resp 18 | Ht 63.0 in | Wt 193.1 lb

## 2019-11-18 DIAGNOSIS — K130 Diseases of lips: Secondary | ICD-10-CM

## 2019-11-18 NOTE — Progress Notes (Signed)
Patient ID: Barbara Lutz, female  DOB: 10/01/1995, 24 y.o.   MRN: [id number] Patient Care Team    Relationship Specialty Notifications Start End  Ma Hillock, DO PCP - General Family Medicine  10/29/19     Chief Complaint  Patient presents with  . mucocele    Came Back on R lower lip     Subjective: Barbara Lutz is a 24 y.o.  female present for f/u mucocele  Mucocele:  Patient returns today after drainage of mucocele 10/29/2019.  Unfortunately her mucocele has started to recur.  Patient reports its not as large as it was prior, but is continuously enlarging. Prior note:  Patient presents to the office today for patient transfer with complaints of a cyst on her lower lip.  She reports it has been present for about 6 months.  The cyst has become larger over the last 1-2 months.  She states it is not tender.  It has not drained.  She endorses biting her lip frequently secondary to stress.  She denies fever, chills, erythema or drainage at location.  No flowsheet data found. No flowsheet data found.      Fall Risk  07/23/2018  Falls in the past year? 0  Number falls in past yr: 0  Injury with Fall? 0  Follow up Falls evaluation completed     Immunization History  Administered Date(s) Administered  . DTaP 03/19/1996, 05/17/1996, 08/03/1996, 08/19/1997, 09/25/2000  . DTaP / HiB 03/19/1996, 05/17/1996, 08/03/1996, 02/01/1997  . HPV Quadrivalent 03/30/2009, 03/07/2010, 07/03/2010  . Hepatitis A, Ped/Adol-2 Dose 03/10/2008, 03/30/2009  . Hepatitis B, ped/adol 07/15/1995, 03/19/1996, 11/25/1996  . IPV 03/19/1996, 05/17/1996, 08/19/1997, 09/25/2000  . Influenza Inj Mdck Quad Pf 02/15/2018  . Influenza, Seasonal, Injecte, Preservative Fre 04/19/2016  . Influenza,inj,Quad PF,6+ Mos 04/19/2016, 04/19/2016, 03/18/2017, 03/22/2019  . Influenza,inj,quad, With Preservative 03/10/2008, 03/30/2009, 03/07/2010  . Influenza-Unspecified 03/10/2008, 03/30/2009, 03/07/2010  . MMR  02/01/1997, 09/25/2000  . Meningococcal Conjugate 03/10/2008, 02/14/2014  . Pneumococcal-Unspecified 07/09/1999  . Td 02/06/2007, 03/18/2017  . Tdap 02/06/2007  . Varicella 02/01/1997, 03/10/2008    No exam data present  Past Medical History:  Diagnosis Date  . Anxiety   . Depression   . History of pyelonephritis 07/23/2018  . Obesity (BMI 30-39.9) 07/23/2018   [facility]  . Recurrent UTI 07/23/2018  . Thyroid disorder    Allergies  Allergen Reactions  . Latex Rash  . Nickel Dermatitis   Past Surgical History:  Procedure Laterality Date  . NO PAST SURGERIES     Family History  Problem Relation Age of Onset  . COPD Mother   . Hypertension Mother   . Heart attack Father   . High Cholesterol Father   . Depression Sister   . Diabetes Maternal Grandfather   . Hearing loss Maternal Grandfather   . Heart attack Maternal Grandfather   . Heart disease Maternal Grandfather   . Hypertension Maternal Grandfather   . Hyperlipidemia Maternal Grandfather   . ADD / ADHD Paternal Grandfather   . Alcohol abuse Paternal Grandfather   . COPD Paternal Grandfather   . Diabetes Paternal Grandfather   . Hearing loss Paternal Grandfather   . Hyperlipidemia Paternal Grandfather   . Hypertension Paternal Grandfather   . Stroke Paternal Grandfather   . Asthma Sister   . Depression Sister   . Drug abuse Sister   . Breast cancer Maternal Grandmother   . Pancreatic cancer Paternal Grandmother   . Arthritis Paternal Grandmother   .  Hypertension Paternal Grandmother    Social History   Social History Narrative   Marital status/children/pets: Single.   Education/employment: GED.  Employed. Manager.   Safety:      -smoke alarm in the home:Yes     - wears seatbelt: Yes     - Feels safe in their relationships: Yes    Allergies as of 11/18/2019      Reactions   Latex Rash   Nickel Dermatitis      Medication List       Accurate as of November 18, 2019  3:56 PM. If you have any  questions, ask your nurse or doctor.        STOP taking these medications   HCG IJ Stopped by: Howard Pouch, DO   phentermine 37.5 MG capsule Stopped by: Howard Pouch, DO   Tri-Estarylla 0.18/0.215/0.25 MG-35 MCG tablet Generic drug: Norgestimate-Ethinyl Estradiol Triphasic Stopped by: Howard Pouch, DO     TAKE these medications   amoxicillin-clavulanate 875-125 MG tablet Commonly known as: AUGMENTIN Take 1 tablet by mouth 2 (two) times daily.   chlorhexidine 0.12 % solution Commonly known as: PERIDEX Use as directed 15 mLs in the mouth or throat 2 (two) times daily.   MULTIVITAMIN ADULT PO Take by mouth.       All past medical history, surgical history, allergies, family history, immunizations andmedications were updated in the EMR today and reviewed under the history and medication portions of their EMR.    No results found for this or any previous visit (from the past 2160 hour(s)).  No results found.   ROS: 14 pt review of systems performed and negative (unless mentioned in an HPI)  Objective: BP 103/71 (BP Location: Right Arm, Patient Position: Sitting, Cuff Size: Large)   Pulse 82   Temp 97.7 F (36.5 C) (Temporal)   Resp 18   Ht '5\' 3"'  (1.6 m)   Wt 193 lb 2 oz (87.6 kg)   LMP 10/27/2019 (Exact Date)   SpO2 99%   BMI 34.21 kg/m   Gen: Afebrile. No acute distress.  HENT: AT. [place]. MMM-mucocele right inner lower lip ~0.5-0.75 cm.  No drainage.   Assessment/plan: Barbara Lutz is a 24 y.o. female present for transfer of care Mucocele of lower lip Right inner lower lip mucocele reoccurrence.  Mucocele is not as large as it was prior to drainage, however it has reoccurred rather quickly.  Patient aware that was a possibility and now would like to proceed with surgical resection. Referral to plastic surgery for excision.   Return if symptoms worsen or fail to improve.  Orders Placed This Encounter  Procedures  . Ambulatory referral to Plastic Surgery    No orders of the defined types were placed in this encounter.   Referral Orders     Ambulatory referral to Plastic Surgery   Note is dictated utilizing voice recognition software. Although note has been proof read prior to signing, occasional typographical errors still can be missed. If any questions arise, please do not hesitate to call for verification.  Electronically signed by: Howard Pouch, DO [place]

## 2019-11-18 NOTE — Patient Instructions (Signed)
You will receive a call to get in to one of the speciality groups to have a surgical removal of your cyst.    Sorry it came back so soon.

## 2019-11-19 ENCOUNTER — Ambulatory Visit: Payer: 59 | Admitting: Family Medicine

## 2019-12-08 ENCOUNTER — Encounter: Payer: Self-pay | Admitting: Plastic Surgery

## 2019-12-08 ENCOUNTER — Ambulatory Visit: Payer: 59 | Admitting: Plastic Surgery

## 2019-12-08 ENCOUNTER — Other Ambulatory Visit: Payer: Self-pay

## 2019-12-08 VITALS — BP 99/58 | HR 74 | Temp 98.6°F | Ht 62.0 in | Wt 180.0 lb

## 2019-12-08 DIAGNOSIS — K130 Diseases of lips: Secondary | ICD-10-CM

## 2019-12-08 NOTE — Progress Notes (Signed)
Referring Provider [name] A, DO [address] [place],  [place] [address]   CC: No chief complaint on file.    Right lower lip lesion  Barbara Lutz is an 24 y.o. female.  HPI: Patient presents with a right lower lip lesion.  It started 9 months ago.  She has had it drained 1 time by her primary physician but it recurred.  She is bothered by the swelling and the pain.  Intermittently she will bite it as well inadvertently while eating.  She would like to be excised.  Allergies  Allergen Reactions  . Latex Rash  . Nickel Dermatitis    Outpatient Encounter Medications as of 12/08/2019  Medication Sig  . amoxicillin-clavulanate (AUGMENTIN) 875-125 MG tablet Take 1 tablet by mouth 2 (two) times daily. (Patient not taking: Reported on 11/18/2019)  . chlorhexidine (PERIDEX) 0.12 % solution Use as directed 15 mLs in the mouth or throat 2 (two) times daily. (Patient not taking: Reported on 11/18/2019)  . Multiple Vitamin (MULTIVITAMIN ADULT PO) Take by mouth. (Patient not taking: Reported on 12/08/2019)   No facility-administered encounter medications on file as of 12/08/2019.     Past Medical History:  Diagnosis Date  . Anxiety   . Depression   . History of pyelonephritis 07/23/2018  . Obesity (BMI 30-39.9) 07/23/2018   [facility]  . Recurrent UTI 07/23/2018  . Thyroid disorder     Past Surgical History:  Procedure Laterality Date  . NO PAST SURGERIES      Family History  Problem Relation Age of Onset  . COPD Mother   . Hypertension Mother   . Heart attack Father   . High Cholesterol Father   . Depression Sister   . Diabetes Maternal Grandfather   . Hearing loss Maternal Grandfather   . Heart attack Maternal Grandfather   . Heart disease Maternal Grandfather   . Hypertension Maternal Grandfather   . Hyperlipidemia Maternal Grandfather   . ADD / ADHD Paternal Grandfather   . Alcohol abuse Paternal Grandfather   . COPD Paternal Grandfather   . Diabetes Paternal  Grandfather   . Hearing loss Paternal Grandfather   . Hyperlipidemia Paternal Grandfather   . Hypertension Paternal Grandfather   . Stroke Paternal Grandfather   . Asthma Sister   . Depression Sister   . Drug abuse Sister   . Breast cancer Maternal Grandmother   . Pancreatic cancer Paternal Grandmother   . Arthritis Paternal Grandmother   . Hypertension Paternal Grandmother     Social History   Social History Narrative   Marital status/children/pets: Single.   Education/employment: GED.  Employed. Manager.   Safety:      -smoke alarm in the home:Yes     - wears seatbelt: Yes     - Feels safe in their relationships: Yes     Review of Systems General: Denies fevers, chills, weight loss CV: Denies chest pain, shortness of breath, palpitations  Physical Exam Vitals with BMI 12/08/2019 11/18/2019 10/29/2019  Height 5\' 2"  5\' 3"  5' 2.75"  Weight 180 lbs 193 lbs 2 oz 194 lbs 8 oz  BMI 32.91 34.22 34.72  Systolic 99 103 122  Diastolic 58 71 80  Pulse 74 82 70    General:  No acute distress,  Alert and oriented, Non-Toxic, Normal speech and affect Right lower lip shows a full palpable lesion on the oral mucosa.  It is about 1.5 cm in size.  It does not appear fluctuant or  infected at this time.  Assessment/Plan Patient is a right lower lip lesion suspected mucocele.  We discussed excision.  We discussed the risks that include bleeding, infection, damage to surrounding structures, need for additional procedures.  We discussed the potential for distortion of the lip after excision but it would look better than it does now.  We will plan to set this up to be done under local in the office.  Cindra Presume 12/08/2019, 11:04 AM

## 2020-01-16 DIAGNOSIS — K130 Diseases of lips: Secondary | ICD-10-CM

## 2020-01-16 HISTORY — DX: Diseases of lips: K13.0

## 2020-01-20 ENCOUNTER — Other Ambulatory Visit: Payer: Self-pay

## 2020-01-20 ENCOUNTER — Encounter: Payer: Self-pay | Admitting: Plastic Surgery

## 2020-01-20 ENCOUNTER — Ambulatory Visit (INDEPENDENT_AMBULATORY_CARE_PROVIDER_SITE_OTHER): Payer: 59 | Admitting: Plastic Surgery

## 2020-01-20 ENCOUNTER — Other Ambulatory Visit (HOSPITAL_COMMUNITY)
Admission: RE | Admit: 2020-01-20 | Discharge: 2020-01-20 | Disposition: A | Discharge status: Home / Self Care | Payer: 59 | Source: Ambulatory Visit | Attending: Plastic Surgery | Admitting: Plastic Surgery

## 2020-01-20 VITALS — BP 108/68 | HR 71 | Temp 98.6°F

## 2020-01-20 DIAGNOSIS — K130 Diseases of lips: Secondary | ICD-10-CM | POA: Insufficient documentation

## 2020-01-20 DIAGNOSIS — K310 Acute dilatation of stomach: Secondary | ICD-10-CM | POA: Diagnosis not present

## 2020-01-20 NOTE — Progress Notes (Signed)
Operative Note   DATE OF OPERATION: 01/20/2020  LOCATION:    SURGICAL DEPARTMENT: Plastic Surgery  PREOPERATIVE DIAGNOSES:  Right lower lip lesion  POSTOPERATIVE DIAGNOSES:  same  PROCEDURE:  1. Excision of right lower lip lesion measuring 2.5 cm 2. Complex closure measuring 2.5 cm  SURGEON: Ancil Linsey, MD  ANESTHESIA:  Local  COMPLICATIONS: None.   INDICATIONS FOR PROCEDURE:  The patient, Barbara Lutz is a 24 y.o. female born on 02-17-1996, is here for treatment of right lower lip lesion. MRN: [id number]  CONSENT:  Informed consent was obtained directly from the patient. Risks, benefits and alternatives were fully discussed. Specific risks including but not limited to bleeding, infection, hematoma, seroma, scarring, pain, infection, wound healing problems, and need for further surgery were all discussed. The patient did have an ample opportunity to have questions answered to satisfaction.   DESCRIPTION OF PROCEDURE:  Local anesthesia was administered. The patient's operative site was prepped and draped in a sterile fashion. A time out was performed and all information was confirmed to be correct.  The lesion was excised with a 15 blade.  Hemostasis was obtained.  Circumferential undermining was performed and the skin was advanced and closed in layers with interrupted buried Monocryl sutures and 4-0 vicryl for the skin.  The lesion excised measured 2.5 cm, and the total length of closure measured 2.5 cm.    The patient tolerated the procedure well.  There were no complications.

## 2020-01-24 LAB — SURGICAL PATHOLOGY

## 2020-02-03 ENCOUNTER — Encounter: Payer: Self-pay | Admitting: Surgical

## 2020-02-03 ENCOUNTER — Other Ambulatory Visit: Payer: Self-pay

## 2020-02-03 ENCOUNTER — Ambulatory Visit (INDEPENDENT_AMBULATORY_CARE_PROVIDER_SITE_OTHER): Payer: 59 | Admitting: Surgical

## 2020-02-03 VITALS — BP 117/80 | HR 70 | Temp 98.4°F

## 2020-02-03 DIAGNOSIS — K130 Diseases of lips: Secondary | ICD-10-CM

## 2020-02-03 NOTE — Progress Notes (Signed)
Patient is a 24 year old female here for follow-up after excision of right lower lip lesion on 01/20/2020 with Dr. Arita Miss.  She reports that she is doing well.  Pathology shows mucocele without any evidence of malignancy.  Patient reports sutures have dissolved and recently the last 2 have come out.  She reports that she has noticed some intermittent sensory changes.  On exam the incision is healing well, no dehiscence noted.  There is some firmness noted with palpation.  No erythema.  No sutures noted.  No drainage noted.  Follow-up as needed, recommend calling with any questions or concerns.

## 2020-02-05 DIAGNOSIS — Z23 Encounter for immunization: Secondary | ICD-10-CM | POA: Diagnosis not present

## 2020-02-14 ENCOUNTER — Encounter: Payer: Self-pay | Admitting: Family Medicine

## 2020-02-15 NOTE — Telephone Encounter (Signed)
Called patient to see if available for appt tomorrow afternoon, verified she would be able to come. Appt scheduled

## 2020-02-15 NOTE — Telephone Encounter (Signed)
There is an opening tomorrow 02/16/2020 at 330.

## 2020-02-15 NOTE — Telephone Encounter (Signed)
Please review schedule and advise if available for work in appt and if appt needs to be virtual/in person

## 2020-02-16 ENCOUNTER — Encounter: Payer: Self-pay | Admitting: Family Medicine

## 2020-02-16 ENCOUNTER — Other Ambulatory Visit: Payer: 59

## 2020-02-16 ENCOUNTER — Other Ambulatory Visit: Payer: Self-pay

## 2020-02-16 ENCOUNTER — Telehealth: Payer: Self-pay

## 2020-02-16 ENCOUNTER — Other Ambulatory Visit (HOSPITAL_COMMUNITY)
Admission: RE | Admit: 2020-02-16 | Discharge: 2020-02-16 | Disposition: A | Discharge status: Home / Self Care | Payer: 59 | Source: Ambulatory Visit | Attending: Family Medicine | Admitting: Family Medicine

## 2020-02-16 ENCOUNTER — Telehealth (INDEPENDENT_AMBULATORY_CARE_PROVIDER_SITE_OTHER): Payer: 59 | Admitting: Family Medicine

## 2020-02-16 DIAGNOSIS — R300 Dysuria: Secondary | ICD-10-CM

## 2020-02-16 DIAGNOSIS — N898 Other specified noninflammatory disorders of vagina: Secondary | ICD-10-CM

## 2020-02-16 DIAGNOSIS — R8290 Unspecified abnormal findings in urine: Secondary | ICD-10-CM

## 2020-02-16 LAB — POCT URINALYSIS DIP (MANUAL ENTRY)
Bilirubin, UA: NEGATIVE
Blood, UA: NEGATIVE
Glucose, UA: NEGATIVE mg/dL
Ketones, POC UA: NEGATIVE mg/dL
Leukocytes, UA: NEGATIVE
Nitrite, UA: NEGATIVE
Spec Grav, UA: 1.01 (ref 1.010–1.025)
Urobilinogen, UA: 0.2 E.U./dL
pH, UA: 8.5 — AB (ref 5.0–8.0)

## 2020-02-16 MED ORDER — FLUCONAZOLE 150 MG PO TABS: 150.0000 mg | ORAL_TABLET | Freq: Once | ORAL | 0 refills | Status: AC

## 2020-02-16 MED ORDER — METRONIDAZOLE 500 MG PO TABS
500.0000 mg | ORAL_TABLET | Freq: Two times a day (BID) | ORAL | 0 refills | Status: DC
Start: 2020-02-16 — End: 2020-03-10

## 2020-02-16 MED FILL — METRONIDAZOLE 500 MG TABS: 500 | 7 days supply | Qty: 14 | Fill #0

## 2020-02-16 MED FILL — FLUCONAZOLE 150 MG TABS: 150 | 1 days supply | Qty: 1 | Fill #0

## 2020-02-16 NOTE — Progress Notes (Signed)
VIRTUAL VISIT VIA VIDEO  I connected with Barbara Lutz on 02/16/20 at  3:30 PM EDT by elemedicine application and verified that I am speaking with the correct person using two identifiers. Location patient: Home Location provider: [facility], Office Persons participating in the virtual visit: Patient, Dr. Claiborne Billings and V. White, [name]  I discussed the limitations of evaluation and management by telemedicine and the availability of in person appointments. The patient expressed understanding and agreed to proceed.   SUBJECTIVE Chief Complaint  Patient presents with  . Dysuria    HPI: Barbara Lutz is a 24 y.o. female present for dysuria. Pt has a significant medical h/o pyelonephritis. She endorses a vaginal discharge and irritation for 1-2 weeks. She endorses more pressure than burning with urination. She denies vaginal lesions. LMP 8/23. She denies recent unprotected sex prior to onset. She denies any recent abx use.   ROS: See pertinent positives and negatives per HPI.  Patient Active Problem List   Diagnosis Date Noted  . History of pyelonephritis 07/23/2018  . Nicotine dependence, cigarettes, uncomplicated 07/23/2018  . Obesity (BMI 30-39.9) 07/23/2018  . Tobacco abuse 03/20/2017  . Depression, major, recurrent, moderate (HCC) 05/25/2012    Social History   Tobacco Use  . Smoking status: Current Every Day Smoker  . Smokeless tobacco: Never Used  Substance Use Topics  . Alcohol use: Yes    Comment: social    Current Outpatient Medications:  .  fluconazole (DIFLUCAN) 150 MG tablet, Take 1 tablet (150 mg total) by mouth once for 1 dose., Disp: 1 tablet, Rfl: 0 .  metroNIDAZOLE (FLAGYL) 500 MG tablet, Take 1 tablet (500 mg total) by mouth 2 (two) times daily., Disp: 14 tablet, Rfl: 0 .  Multiple Vitamin (MULTIVITAMIN ADULT PO), Take by mouth. , Disp: , Rfl:   Allergies  Allergen Reactions  . Latex Rash  . Nickel Dermatitis    OBJECTIVE: There were no vitals  taken for this visit. Gen: No acute distress. Nontoxic in appearance.  HENT: AT. [place].  MMM.  Eyes:Pupils Equal Round Reactive to light, Extraocular movements intact,  Conjunctiva without redness, discharge or icterus. Neuro: Alert. Oriented x3  Psych: Normal affect and demeanor. Normal speech. Normal thought content and judgment.  ASSESSMENT AND PLAN: Barbara Lutz is a 24 y.o. female present for  Dysuria/abnormal urine/vaginal discharge Urine POCT does not appear infectious today. Given her h/o pyelo will obtain formal microanalysis and reflex culture to be certain. HPI seems to be more consistent with yeast or BV.  Elected to treat with diflucan x1 and flagyl BID x7 days.  Urine cytology also sent.  - POCT urinalysis dipstick - Urine Culture; Future - Urinalysis w microscopic + reflex cultur - Urine cytology ancillary only() - follow up dependent on lab results  [name], DO 02/16/2020   No follow-ups on file.  Orders Placed This Encounter  Procedures  . Urine Culture  . Urinalysis w microscopic + reflex cultur  . POCT urinalysis dipstick   Meds ordered this encounter  Medications  . metroNIDAZOLE (FLAGYL) 500 MG tablet    Sig: Take 1 tablet (500 mg total) by mouth 2 (two) times daily.    Dispense:  14 tablet    Refill:  0  . fluconazole (DIFLUCAN) 150 MG tablet    Sig: Take 1 tablet (150 mg total) by mouth once for 1 dose.    Dispense:  1 tablet    Refill:  0   Referral Orders  No  referral(s) requested today

## 2020-02-16 NOTE — Telephone Encounter (Signed)
Patient said she scheduled the appointment with the nurse as a virtual visit. Patient cannot come in to the office today. Please advise.

## 2020-02-18 LAB — URINE CYTOLOGY ANCILLARY ONLY
Bacterial Vaginitis-Urine: NEGATIVE
Candida Urine: NEGATIVE
Chlamydia: NEGATIVE
Comment: NEGATIVE
Comment: NEGATIVE
Comment: NORMAL
Neisseria Gonorrhea: NEGATIVE
Trichomonas: NEGATIVE

## 2020-02-18 LAB — URINE CULTURE
MICRO NUMBER:: 10899378
Result:: NO GROWTH
SPECIMEN QUALITY:: ADEQUATE

## 2020-02-18 NOTE — Progress Notes (Signed)
Notified pt of results 

## 2020-03-10 ENCOUNTER — Emergency Department (HOSPITAL_BASED_OUTPATIENT_CLINIC_OR_DEPARTMENT_OTHER): Payer: 59

## 2020-03-10 ENCOUNTER — Other Ambulatory Visit: Payer: Self-pay

## 2020-03-10 ENCOUNTER — Encounter (HOSPITAL_BASED_OUTPATIENT_CLINIC_OR_DEPARTMENT_OTHER): Payer: Self-pay

## 2020-03-10 ENCOUNTER — Emergency Department (HOSPITAL_BASED_OUTPATIENT_CLINIC_OR_DEPARTMENT_OTHER)
Admission: EM | Admit: 2020-03-10 | Discharge: 2020-03-10 | Disposition: A | Discharge status: Home / Self Care | Payer: 59 | Attending: Emergency Medicine | Admitting: Emergency Medicine

## 2020-03-10 DIAGNOSIS — F17200 Nicotine dependence, unspecified, uncomplicated: Secondary | ICD-10-CM | POA: Diagnosis not present

## 2020-03-10 DIAGNOSIS — R109 Unspecified abdominal pain: Secondary | ICD-10-CM | POA: Diagnosis present

## 2020-03-10 DIAGNOSIS — Z91040 Latex allergy status: Secondary | ICD-10-CM | POA: Insufficient documentation

## 2020-03-10 DIAGNOSIS — N202 Calculus of kidney with calculus of ureter: Secondary | ICD-10-CM | POA: Insufficient documentation

## 2020-03-10 DIAGNOSIS — N201 Calculus of ureter: Secondary | ICD-10-CM

## 2020-03-10 DIAGNOSIS — N200 Calculus of kidney: Secondary | ICD-10-CM | POA: Diagnosis not present

## 2020-03-10 DIAGNOSIS — R319 Hematuria, unspecified: Secondary | ICD-10-CM | POA: Diagnosis not present

## 2020-03-10 DIAGNOSIS — N134 Hydroureter: Secondary | ICD-10-CM | POA: Diagnosis not present

## 2020-03-10 LAB — URINALYSIS, MICROSCOPIC (REFLEX)

## 2020-03-10 LAB — URINALYSIS, ROUTINE W REFLEX MICROSCOPIC
Bilirubin Urine: NEGATIVE
Glucose, UA: NEGATIVE mg/dL
Ketones, ur: NEGATIVE mg/dL
Leukocytes,Ua: NEGATIVE
Nitrite: NEGATIVE
Protein, ur: NEGATIVE mg/dL
Specific Gravity, Urine: 1.02 (ref 1.005–1.030)
pH: 7 (ref 5.0–8.0)

## 2020-03-10 LAB — PREGNANCY, URINE: Preg Test, Ur: NEGATIVE

## 2020-03-10 MED ORDER — IBUPROFEN 800 MG PO TABS: 800.0000 mg | ORAL_TABLET | Freq: Three times a day (TID) | ORAL | 0 refills | Status: DC | PRN

## 2020-03-10 MED ORDER — TAMSULOSIN HCL 0.4 MG PO CAPS
0.4000 mg | ORAL_CAPSULE | Freq: Every day | ORAL | 0 refills | Status: DC
Start: 2020-03-10 — End: 2020-09-11

## 2020-03-10 MED ORDER — HYDROCODONE-ACETAMINOPHEN 5-325 MG PO TABS: 1.0000 | ORAL_TABLET | Freq: Four times a day (QID) | ORAL | 0 refills | Status: DC | PRN

## 2020-03-10 NOTE — ED Notes (Signed)
Pt aware urine collection needed. 

## 2020-03-10 NOTE — ED Notes (Signed)
Patient denies pain and is resting comfortably.  

## 2020-03-10 NOTE — ED Triage Notes (Addendum)
Pt c/o left side abd pain, n/v x 1 hour-states her work checked her urine and she had blood in urine-NAD-to triage in w/c

## 2020-03-10 NOTE — ED Provider Notes (Signed)
[facility] EMERGENCY DEPARTMENT Provider Note  CSN: [id number] Arrival date & time: 03/10/20 1402    History Chief Complaint  Patient presents with  . Abdominal Pain    HPI  Barbara Lutz is a 24 y.o. female report she was at work in the [employer] today when she had sudden onset of severe cramping, sharp L sided abdominal and flank pain. She reports she had just had lunch and then a bowel movement but no diarrhea or constipation prior to onset of pain. She reports pain intensified and was associated with nausea and diaphoresis. She had a dip urine at work and it was positive for blood. No reported signs of infection. She finished her menses several days ago. Has not noticed any blood in her urine lately. No dysuria or hematuria. She was treated empirically for yeast and BV by PCP about 3 weeks ago but subsequent urine testing was negative. She has a history of pyelonephritis requiring admission in 2017-18 but no known history of renal stones. Her pain has subsided, lasted about total.    Past Medical History:  Diagnosis Date  . Anxiety   . Depression   . History of pyelonephritis 07/23/2018  . Obesity (BMI 30-39.9) 07/23/2018   [facility]  . Recurrent UTI 07/23/2018  . Thyroid disorder     Past Surgical History:  Procedure Laterality Date  . NO PAST SURGERIES      Family History  Problem Relation Age of Onset  . COPD Mother   . Hypertension Mother   . Heart attack Father   . High Cholesterol Father   . Depression Sister   . Diabetes Maternal Grandfather   . Hearing loss Maternal Grandfather   . Heart attack Maternal Grandfather   . Heart disease Maternal Grandfather   . Hypertension Maternal Grandfather   . Hyperlipidemia Maternal Grandfather   . ADD / ADHD Paternal Grandfather   . Alcohol abuse Paternal Grandfather   . COPD Paternal Grandfather   . Diabetes Paternal Grandfather   . Hearing loss Paternal Grandfather   .  Hyperlipidemia Paternal Grandfather   . Hypertension Paternal Grandfather   . Stroke Paternal Grandfather   . Asthma Sister   . Depression Sister   . Drug abuse Sister   . Breast cancer Maternal Grandmother   . Pancreatic cancer Paternal Grandmother   . Arthritis Paternal Grandmother   . Hypertension Paternal Grandmother     Social History   Tobacco Use  . Smoking status: Current Every Day Smoker  . Smokeless tobacco: Never Used  Vaping Use  . Vaping Use: Never used  Substance Use Topics  . Alcohol use: Yes    Comment: social  . Drug use: No     Home Medications Prior to Admission medications   Medication Sig Start Date End Date Taking? Authorizing Provider  HYDROcodone-acetaminophen (NORCO/VICODIN) 5-325 MG tablet Take 1 tablet by mouth every 6 (six) hours as needed for severe pain. 03/10/20   Pollyann Savoy, MD  ibuprofen (ADVIL) 800 MG tablet Take 1 tablet (800 mg total) by mouth every 8 (eight) hours as needed. 03/10/20   Pollyann Savoy, MD  tamsulosin (FLOMAX) 0.4 MG CAPS capsule Take 1 capsule (0.4 mg total) by mouth daily. 03/10/20   Pollyann Savoy, MD     Allergies    Latex and Nickel   Review of Systems   Review of Systems A comprehensive review of systems was completed and negative except as noted  in HPI.    Physical Exam BP 120/72 (BP Location: Right Arm)   Pulse 73   Temp 98.2 F (36.8 C) (Oral)   Resp 19   Ht 5\' 2"  (1.575 m)   Wt 84.4 kg   LMP 03/05/2020   SpO2 100%   BMI 34.02 kg/m   Physical Exam Vitals and nursing note reviewed.  Constitutional:      Appearance: Normal appearance.  HENT:     Head: Normocephalic and atraumatic.     Nose: Nose normal.     Mouth/Throat:     Mouth: Mucous membranes are moist.  Eyes:     Extraocular Movements: Extraocular movements intact.     Conjunctiva/sclera: Conjunctivae normal.  Cardiovascular:     Rate and Rhythm: Normal rate.  Pulmonary:     Effort: Pulmonary effort is normal.      Breath sounds: Normal breath sounds.  Abdominal:     General: Abdomen is flat.     Palpations: Abdomen is soft.     Tenderness: There is no abdominal tenderness. There is no guarding. Negative signs include Murphy's sign and McBurney's sign.  Musculoskeletal:        General: No swelling. Normal range of motion.     Cervical back: Neck supple.  Skin:    General: Skin is warm and dry.  Neurological:     General: No focal deficit present.     Mental Status: She is alert.  Psychiatric:        Mood and Affect: Mood normal.      ED Results / Procedures / Treatments   Labs (all labs ordered are listed, but only abnormal results are displayed) Labs Reviewed  URINALYSIS, ROUTINE W REFLEX MICROSCOPIC - Abnormal; Notable for the following components:      Result Value   Hgb urine dipstick MODERATE (*)    All other components within normal limits  URINALYSIS, MICROSCOPIC (REFLEX) - Abnormal; Notable for the following components:   Bacteria, UA MANY (*)    All other components within normal limits  PREGNANCY, URINE    EKG None   Radiology CT Renal Stone Study  Result Date: 03/10/2020 CLINICAL DATA:  Left flank pain, hematuria EXAM: CT ABDOMEN AND PELVIS WITHOUT CONTRAST TECHNIQUE: Multidetector CT imaging of the abdomen and pelvis was performed following the standard protocol without IV contrast. COMPARISON:  None. FINDINGS: Lower chest: No acute pleural or parenchymal lung disease. Hepatobiliary: No focal liver abnormality is seen. No gallstones, gallbladder wall thickening, or biliary dilatation. Pancreas: Unremarkable. No pancreatic ductal dilatation or surrounding inflammatory changes. Spleen: Normal in size without focal abnormality. Adrenals/Urinary Tract: There is a punctate less than 2 mm nonobstructing right renal calculus reference image 34. No sign of right-sided obstruction. There is minimal distal left hydroureter related to a punctate less than 2 mm calculus at the left UVJ.  No frank hydronephrosis. The left kidney is otherwise unremarkable. The adrenals are normal.  The bladder is otherwise unremarkable. Stomach/Bowel: No bowel obstruction or ileus. No wall thickening or inflammatory change. Normal appendix right lower quadrant. Vascular/Lymphatic: No significant vascular findings are present. No enlarged abdominal or pelvic lymph nodes. Reproductive: Uterus and bilateral adnexa are unremarkable. Other: No free fluid or free gas.  No abdominal wall hernia. Musculoskeletal: No acute or destructive bony lesions. Reconstructed images demonstrate no additional findings. IMPRESSION: 1. Mild distal left hydroureter related to a 2 mm calculus at the left UVJ. 2. Punctate nonobstructing right renal calculus. Electronically Signed   By: 03/12/2020  Manson Passey M.D.   On: 03/10/2020 17:14    Procedures Procedures  Medications Ordered in the ED Medications - No data to display   MDM Rules/Calculators/A&P MDM Patient here with L abd/flank pain and reported microscopic hematuria. Pain is improved now. Will check formal UA here and likely send for CT stone study.  ED Course  I have reviewed the triage vital signs and the nursing notes.  Pertinent labs & imaging results that were available during my care of the patient were reviewed by me and considered in my medical decision making (see chart for details).  Clinical Course as of Mar 10 1753  Fri Mar 10, 2020  1554 Preg neg, will send for CT stone study.   [CS]  1749 CT results reviewed,confirms small distal ureteral stone. Patient resting comfortably now. Discussed results and recommend outpatient urology follow up if she does not pass the stone over the weekend. Rx for Motrin, Norco and Flomax in the meantime. RTED for any other concerns.    [CS]    Clinical Course User Index [CS] Pollyann Savoy, MD    Final Clinical Impression(s) / ED Diagnoses Final diagnoses:  Ureteral stone    Rx / DC Orders ED Discharge Orders          Ordered    HYDROcodone-acetaminophen (NORCO/VICODIN) 5-325 MG tablet  Every 6 hours PRN        03/10/20 1753    ibuprofen (ADVIL) 800 MG tablet  Every 8 hours PRN        03/10/20 1753    tamsulosin (FLOMAX) 0.4 MG CAPS capsule  Daily        03/10/20 1753           Pollyann Savoy, MD 03/10/20 1754

## 2020-03-13 ENCOUNTER — Encounter: Payer: Self-pay | Admitting: Family Medicine

## 2020-03-13 DIAGNOSIS — N200 Calculus of kidney: Secondary | ICD-10-CM

## 2020-03-13 NOTE — Telephone Encounter (Signed)
Pt has upcoming appt for CPE. Pt was dx with Ureteral stone. Do not see referral in chart

## 2020-03-14 NOTE — Telephone Encounter (Signed)
Please inform patient I have placed a referral to urology for her.  I do not know who she was referred to, and cannot see a referral placement by ED in the system.   [facility] is a large group of urologist located at [facility].  They typically receive all referrals within the [facility] system. I have placed the referral for her.

## 2020-03-15 ENCOUNTER — Encounter: Payer: 59 | Admitting: Family Medicine

## 2020-03-17 ENCOUNTER — Ambulatory Visit (INDEPENDENT_AMBULATORY_CARE_PROVIDER_SITE_OTHER): Payer: 59 | Admitting: Family Medicine

## 2020-03-17 ENCOUNTER — Other Ambulatory Visit (HOSPITAL_COMMUNITY)
Admission: RE | Admit: 2020-03-17 | Discharge: 2020-03-17 | Disposition: A | Discharge status: Home / Self Care | Payer: 59 | Source: Ambulatory Visit | Attending: Family Medicine | Admitting: Family Medicine

## 2020-03-17 ENCOUNTER — Encounter: Payer: Self-pay | Admitting: Family Medicine

## 2020-03-17 ENCOUNTER — Other Ambulatory Visit: Payer: Self-pay

## 2020-03-17 VITALS — BP 89/63 | HR 83 | Temp 98.3°F | Ht 62.5 in | Wt 183.0 lb

## 2020-03-17 DIAGNOSIS — I959 Hypotension, unspecified: Secondary | ICD-10-CM | POA: Diagnosis not present

## 2020-03-17 DIAGNOSIS — Z1159 Encounter for screening for other viral diseases: Secondary | ICD-10-CM

## 2020-03-17 DIAGNOSIS — Z01419 Encounter for gynecological examination (general) (routine) without abnormal findings: Secondary | ICD-10-CM | POA: Diagnosis not present

## 2020-03-17 DIAGNOSIS — Z114 Encounter for screening for human immunodeficiency virus [HIV]: Secondary | ICD-10-CM

## 2020-03-17 DIAGNOSIS — Z131 Encounter for screening for diabetes mellitus: Secondary | ICD-10-CM

## 2020-03-17 DIAGNOSIS — Z0000 Encounter for general adult medical examination without abnormal findings: Secondary | ICD-10-CM

## 2020-03-17 DIAGNOSIS — F17210 Nicotine dependence, cigarettes, uncomplicated: Secondary | ICD-10-CM | POA: Diagnosis not present

## 2020-03-17 DIAGNOSIS — E669 Obesity, unspecified: Secondary | ICD-10-CM

## 2020-03-17 DIAGNOSIS — Z113 Encounter for screening for infections with a predominantly sexual mode of transmission: Secondary | ICD-10-CM | POA: Insufficient documentation

## 2020-03-17 DIAGNOSIS — N200 Calculus of kidney: Secondary | ICD-10-CM

## 2020-03-17 LAB — CBC WITH DIFFERENTIAL/PLATELET
Basophils Absolute: 0 10*3/uL (ref 0.0–0.1)
Basophils Relative: 0.4 % (ref 0.0–3.0)
Eosinophils Absolute: 0 10*3/uL (ref 0.0–0.7)
Eosinophils Relative: 0.5 % (ref 0.0–5.0)
HCT: 42.8 % (ref 36.0–46.0)
Hemoglobin: 14.4 g/dL (ref 12.0–15.0)
Lymphocytes Relative: 21.3 % (ref 12.0–46.0)
Lymphs Abs: 1.3 10*3/uL (ref 0.7–4.0)
MCHC: 33.6 g/dL (ref 30.0–36.0)
MCV: 96.5 fl (ref 78.0–100.0)
Monocytes Absolute: 0.5 10*3/uL (ref 0.1–1.0)
Monocytes Relative: 7.8 % (ref 3.0–12.0)
Neutro Abs: 4.2 10*3/uL (ref 1.4–7.7)
Neutrophils Relative %: 70 % (ref 43.0–77.0)
Platelets: 216 10*3/uL (ref 150.0–400.0)
RBC: 4.43 Mil/uL (ref 3.87–5.11)
RDW: 12.9 % (ref 11.5–15.5)
WBC: 6 10*3/uL (ref 4.0–10.5)

## 2020-03-17 LAB — COMPREHENSIVE METABOLIC PANEL
ALT: 13 U/L (ref 0–35)
AST: 18 U/L (ref 0–37)
Albumin: 4.8 g/dL (ref 3.5–5.2)
Alkaline Phosphatase: 51 U/L (ref 39–117)
BUN: 11 mg/dL (ref 6–23)
CO2: 28 mEq/L (ref 19–32)
Calcium: 9.5 mg/dL (ref 8.4–10.5)
Chloride: 105 mEq/L (ref 96–112)
Creatinine, Ser: 0.9 mg/dL (ref 0.40–1.20)
GFR: 76.84 mL/min (ref >60.00)
Glucose, Bld: 81 mg/dL (ref 70–99)
Potassium: 4.4 mEq/L (ref 3.5–5.1)
Sodium: 140 mEq/L (ref 135–145)
Total Bilirubin: 0.6 mg/dL (ref 0.2–1.2)
Total Protein: 7.2 g/dL (ref 6.0–8.3)

## 2020-03-17 LAB — LIPID PANEL
Cholesterol: 157 mg/dL (ref 0–200)
HDL: 68.5 mg/dL (ref >39.00)
LDL Cholesterol: 78 mg/dL (ref 0–99)
NonHDL: 88.28
Total CHOL/HDL Ratio: 2
Triglycerides: 51 mg/dL (ref 0.0–149.0)
VLDL: 10.2 mg/dL (ref 0.0–40.0)

## 2020-03-17 LAB — HEMOGLOBIN A1C: Hgb A1c MFr Bld: 5.5 % (ref 4.6–6.5)

## 2020-03-17 NOTE — Progress Notes (Signed)
This visit occurred during the SARS-CoV-2 public health emergency.  Safety protocols were in place, including screening questions prior to the visit, additional usage of staff PPE, and extensive cleaning of exam room while observing appropriate contact time as indicated for disinfecting solutions.    Patient ID: Barbara Lutz, female  DOB: 10-04-1995, 24 y.o.   MRN: [id number] Patient Care Team    Relationship Specialty Notifications Start End  Ma Hillock, DO PCP - General Family Medicine  10/29/19     Chief Complaint  Patient presents with  . Annual Exam    pt is fasting    Subjective:  Barbara Lutz is a 24 y.o.  Female  present for CPE . All past medical history, surgical history, allergies, family history, immunizations, medications and social history were  updated in the electronic medical record today. All recent labs, ED visits and hospitalizations within the last year were reviewed.  Attempting to become pregnant greater than 1 year. Concerned over possible PCOS and weight gain. She is not established with gyn.   Health maintenance:  Colonoscopy: no fhx. routine screen at 45 Mammogram: FHX breast cancer in [place]. Routine screen at 40.  Cervical cancer screening: last pap: 2017> referred to gyn Immunizations: tdap UTD 2018, Influenza will get at work (encouraged yearly), covid series completed Infectious disease screening: HIV and Hep C completed today, along with urine cytology for std screen DEXA:routine screen Assistive device: none Oxygen VVO:HYWV Patient has a Dental home. Hospitalizations/ED visits: reviewed   She was recently seen in the ED 03/10/2020 for pelvic pain and dx w/ kidney stone with left hydroureter. She is uncertain if she has passed the stone yet. She is taking flomax 0.4 mg daily prescribed by ED and has a referral into urology- waiting on call from them to schedule. She endorses pelvic cramping, no dysuria. She is in a monogamous relationship of  many years with her female partner. They have had difficulty attempting to conceive. Recent CT abd resulted with 43m stones x2. Reproductive system was normal.    Depression screen PHQ 2/9 03/17/2020  Decreased Interest 1  Down, Depressed, Hopeless 1  PHQ - 2 Score 2  Altered sleeping 0  Tired, decreased energy 2  Change in appetite 1  Feeling bad or failure about yourself  0  Trouble concentrating 0  Moving slowly or fidgety/restless 0  Suicidal thoughts 0  PHQ-9 Score 5   GAD 7 : Generalized Anxiety Score 03/17/2020  Nervous, Anxious, on Edge 3  Control/stop worrying 3  Worry too much - different things 3  Trouble relaxing 2  Restless 0  Easily annoyed or irritable 1  Afraid - awful might happen 0  Total GAD 7 Score 12    Immunization History  Administered Date(s) Administered  . DTaP 03/19/1996, 05/17/1996, 08/03/1996, 08/19/1997, 09/25/2000  . DTaP / HiB 03/19/1996, 05/17/1996, 08/03/1996, 02/01/1997  . HPV Quadrivalent 03/30/2009, 03/07/2010, 07/03/2010  . Hepatitis A, Ped/Adol-2 Dose 03/10/2008, 03/30/2009  . Hepatitis B, ped/adol 010/18/97 03/19/1996, 11/25/1996  . IPV 03/19/1996, 05/17/1996, 08/19/1997, 09/25/2000  . Influenza Inj Mdck Quad Pf 02/15/2018  . Influenza, Seasonal, Injecte, Preservative Fre 04/19/2016  . Influenza,inj,Quad PF,6+ Mos 04/19/2016, 04/19/2016, 03/18/2017, 03/22/2019  . Influenza,inj,quad, With Preservative 03/10/2008, 03/30/2009, 03/07/2010  . Influenza-Unspecified 03/10/2008, 03/30/2009, 03/07/2010  . MMR 02/01/1997, 09/25/2000  . Meningococcal Conjugate 03/10/2008, 02/14/2014  . Moderna SARS-COVID-2 Vaccination 02/05/2020, 03/04/2020  . Pneumococcal-Unspecified 07/09/1999  . Td 02/06/2007, 03/18/2017  . Tdap 02/06/2007  . Varicella 02/01/1997, 03/10/2008  Past Medical History:  Diagnosis Date  . Anxiety   . Depression   . History of pyelonephritis 07/23/2018  . Mucocele of lip 01/2020  . Obesity (BMI 30-39.9) 07/23/2018   [facility]  . Recurrent UTI 07/23/2018  . Thyroid disorder    Allergies  Allergen Reactions  . Latex Rash  . Nickel Dermatitis   Past Surgical History:  Procedure Laterality Date  . NO PAST SURGERIES    . ORAL MUCOCELE EXCISION     Family History  Problem Relation Age of Onset  . COPD Mother   . Hypertension Mother   . Heart attack Father   . High Cholesterol Father   . Depression Sister   . Diabetes Maternal Grandfather   . Hearing loss Maternal Grandfather   . Heart attack Maternal Grandfather   . Heart disease Maternal Grandfather   . Hypertension Maternal Grandfather   . Hyperlipidemia Maternal Grandfather   . ADD / ADHD Paternal Grandfather   . Alcohol abuse Paternal Grandfather   . COPD Paternal Grandfather   . Diabetes Paternal Grandfather   . Hearing loss Paternal Grandfather   . Hyperlipidemia Paternal Grandfather   . Hypertension Paternal Grandfather   . Stroke Paternal Grandfather   . Asthma Sister   . Depression Sister   . Drug abuse Sister   . Breast cancer Maternal Grandmother   . Pancreatic cancer Paternal Grandmother   . Arthritis Paternal Grandmother   . Hypertension Paternal Grandmother    Social History   Social History Narrative   Marital status/children/pets: Single.   Education/employment: GED.  Employed. Manager.   Safety:      -smoke alarm in the home:Yes     - wears seatbelt: Yes     - Feels safe in their relationships: Yes    Allergies as of 03/17/2020      Reactions   Latex Rash   Nickel Dermatitis      Medication List       Accurate as of March 17, 2020 10:13 AM. If you have any questions, ask your nurse or doctor.        STOP taking these medications   HYDROcodone-acetaminophen 5-325 MG tablet Commonly known as: NORCO/VICODIN Stopped by: Howard Pouch, DO     TAKE these medications   ibuprofen 600 MG tablet Commonly known as: ADVIL Take 600 mg by mouth every 6 (six) hours as needed. What changed: Another  medication with the same name was removed. Continue taking this medication, and follow the directions you see here. Changed by: Howard Pouch, DO   tamsulosin 0.4 MG Caps capsule Commonly known as: Flomax Take 1 capsule (0.4 mg total) by mouth daily.       All past medical history, surgical history, allergies, family history, immunizations andmedications were updated in the EMR today and reviewed under the history and medication portions of their EMR.       ROS: 14 pt review of systems performed and negative (unless mentioned in an HPI)  Objective: BP (!) 89/63   Pulse 83   Temp 98.3 F (36.8 C) (Oral)   Ht 5' 2.5" (1.588 m)   Wt 183 lb (83 kg)   LMP 03/05/2020   SpO2 99%   BMI 32.94 kg/m  Gen: Afebrile. No acute distress. Nontoxic in appearance, well-developed, well-nourished,  Pleasant female.  HENT: AT. [place]. Bilateral TM visualized and normal in appearance, normal external auditory canal. MMM, no oral lesions, adequate dentition. Bilateral nares within normal limits. Throat  without erythema, ulcerations or exudates. no Cough on exam, no hoarseness on exam. Eyes:Pupils Equal Round Reactive to light, Extraocular movements intact,  Conjunctiva without redness, discharge or icterus. Neck/lymp/endocrine: Supple,no lymphadenopathy, no thyromegaly CV: RRR no murmur, no edema, +2/4 P posterior tibialis pulses.  Chest: CTAB, no wheeze, rhonchi or crackles. Normal  Respiratory effort. good Air movement. Abd: Soft. flat. NTND. BS present. no Masses palpated. No hepatosplenomegaly. No rebound tenderness or guarding. Skin: no rashes, purpura or petechiae. Warm and well-perfused. Skin intact. Neuro/Msk:  Normal gait. PERLA. EOMi. Alert. Oriented x3.  Cranial nerves II through XII intact. Muscle strength 5/5 upper/lower extremity. DTRs equal bilaterally. Psych: Normal affect, dress and demeanor. Normal speech. Normal thought content and judgment.  No exam data  present  Assessment/plan: Barbara Lutz is a 24 y.o. female present for  CPE  Nicotine dependence, cigarettes, uncomplicated Smoking cessation Obesity (BMI 30-39.9) - Lipid panel Encounter for screening for HIV - HIV antibody (with reflex) Need for hepatitis C screening test - Hepatitis C Antibody Screen for STD (sexually transmitted disease) - HIV antibody (with reflex) - Urine cytology ancillary only([place]) Hypotension, unspecified hypotension type Possibly bc of flomax use and fasting today. Asymptomatic. Monitor.  - Comprehensive metabolic panel - CBC with Differential/Platelet  Visit for pelvic exam - Ambulatory referral to Gynecology - difficulty conceiving.   Diabetes mellitus screening - Hemoglobin A1c  Kidney stone - Urinalysis w microscopic + reflex culture - pt supplied with drainer and hat for urine. Referral is in process.  Continue flomax until stone passes or urology directs her to stop.  Start naproxen BID with food.   Encounter for preventive health examination Patient was encouraged to exercise greater than 150 minutes a week. Patient was encouraged to choose a diet filled with fresh fruits and vegetables, and lean meats. AVS provided to patient today for education/recommendation on gender specific health and safety maintenance. Colonoscopy: no fhx. routine screen at 45 Mammogram: FHX breast cancer in [place]. Routine screen at 40.  Cervical cancer screening: last pap: 2017> referred to gyn Immunizations: tdap UTD 2018, Influenza will get at work (encouraged yearly), covid series completed Infectious disease screening: HIV and Hep C completed today, along with urine cytology for std screen DEXA:routine screen  Return in about 1 year (around 03/17/2021) for CPE (30 min).  Orders Placed This Encounter  Procedures  . Comprehensive metabolic panel  . CBC with Differential/Platelet  . Hemoglobin A1c  . Lipid panel  . Hepatitis C Antibody  . HIV  antibody (with reflex)  . Urinalysis w microscopic + reflex cultur  . Ambulatory referral to Gynecology    No orders of the defined types were placed in this encounter.   Referral Orders     Ambulatory referral to Gynecology   Electronically signed by: Howard Pouch, DO [place]

## 2020-03-17 NOTE — Patient Instructions (Addendum)
I have referred you to gyn.  Start food diary and schedule appt for weight loss counseling.    Health Maintenance, Female Adopting a healthy lifestyle and getting preventive care are important in promoting health and wellness. Ask your health care provider about:  The right schedule for you to have regular tests and exams.  Things you can do on your own to prevent diseases and keep yourself healthy. What should I know about diet, weight, and exercise? Eat a healthy diet   Eat a diet that includes plenty of vegetables, fruits, low-fat dairy products, and lean protein.  Do not eat a lot of foods that are high in solid fats, added sugars, or sodium. Maintain a healthy weight Body mass index (BMI) is used to identify weight problems. It estimates body fat based on height and weight. Your health care provider can help determine your BMI and help you achieve or maintain a healthy weight. Get regular exercise Get regular exercise. This is one of the most important things you can do for your health. Most adults should:  Exercise for at least 150 minutes each week. The exercise should increase your heart rate and make you sweat (moderate-intensity exercise).  Do strengthening exercises at least twice a week. This is in addition to the moderate-intensity exercise.  Spend less time sitting. Even light physical activity can be beneficial. Watch cholesterol and blood lipids Have your blood tested for lipids and cholesterol at 24 years of age, then have this test every 5 years. Have your cholesterol levels checked more often if:  Your lipid or cholesterol levels are high.  You are older than 24 years of age.  You are at high risk for heart disease. What should I know about cancer screening? Depending on your health history and family history, you may need to have cancer screening at various ages. This may include screening for:  Breast cancer.  Cervical cancer.  Colorectal  cancer.  Skin cancer.  Lung cancer. What should I know about heart disease, diabetes, and high blood pressure? Blood pressure and heart disease  High blood pressure causes heart disease and increases the risk of stroke. This is more likely to develop in people who have high blood pressure readings, are of African descent, or are overweight.  Have your blood pressure checked: ? Every 3-5 years if you are 70-72 years of age. ? Every year if you are 74 years old or older. Diabetes Have regular diabetes screenings. This checks your fasting blood sugar level. Have the screening done:  Once every three years after age 80 if you are at a normal weight and have a low risk for diabetes.  More often and at a younger age if you are overweight or have a high risk for diabetes. What should I know about preventing infection? Hepatitis B If you have a higher risk for hepatitis B, you should be screened for this virus. Talk with your health care provider to find out if you are at risk for hepatitis B infection. Hepatitis C Testing is recommended for:  Everyone born from [birth year] through 1965.  Anyone with known risk factors for hepatitis C. Sexually transmitted infections (STIs)  Get screened for STIs, including gonorrhea and chlamydia, if: ? You are sexually active and are younger than 24 years of age. ? You are older than 24 years of age and your health care provider tells you that you are at risk for this type of infection. ? Your sexual activity has changed since  you were last screened, and you are at increased risk for chlamydia or gonorrhea. Ask your health care provider if you are at risk.  Ask your health care provider about whether you are at high risk for HIV. Your health care provider may recommend a prescription medicine to help prevent HIV infection. If you choose to take medicine to prevent HIV, you should first get tested for HIV. You should then be tested every 3 months for as long as  you are taking the medicine. Pregnancy  If you are about to stop having your period (premenopausal) and you may become pregnant, seek counseling before you get pregnant.  Take 400 to 800 micrograms (mcg) of folic acid every day if you become pregnant.  Ask for birth control (contraception) if you want to prevent pregnancy. Osteoporosis and menopause Osteoporosis is a disease in which the bones lose minerals and strength with aging. This can result in bone fractures. If you are 47 years old or older, or if you are at risk for osteoporosis and fractures, ask your health care provider if you should:  Be screened for bone loss.  Take a calcium or vitamin D supplement to lower your risk of fractures.  Be given hormone replacement therapy (HRT) to treat symptoms of menopause. Follow these instructions at home: Lifestyle  Do not use any products that contain nicotine or tobacco, such as cigarettes, e-cigarettes, and chewing tobacco. If you need help quitting, ask your health care provider.  Do not use street drugs.  Do not share needles.  Ask your health care provider for help if you need support or information about quitting drugs. Alcohol use  Do not drink alcohol if: ? Your health care provider tells you not to drink. ? You are pregnant, may be pregnant, or are planning to become pregnant.  If you drink alcohol: ? Limit how much you use to 0-1 drink a day. ? Limit intake if you are breastfeeding.  Be aware of how much alcohol is in your drink. In the [place] one drink equals one 12 oz bottle of beer (355 mL), one 5 oz glass of wine (148 mL), or one 1 oz glass of hard liquor (44 mL). General instructions  Schedule regular health, dental, and eye exams.  Stay current with your vaccines.  Tell your health care provider if: ? You often feel depressed. ? You have ever been abused or do not feel safe at home. Summary  Adopting a healthy lifestyle and getting preventive care are  important in promoting health and wellness.  Follow your health care provider's instructions about healthy diet, exercising, and getting tested or screened for diseases.  Follow your health care provider's instructions on monitoring your cholesterol and blood pressure. This information is not intended to replace advice given to you by your health care provider. Make sure you discuss any questions you have with your health care provider. Document Revised: 05/27/2018 Document Reviewed: 05/27/2018 Elsevier Patient Education  2020 [employer].

## 2020-03-18 LAB — URINALYSIS W MICROSCOPIC + REFLEX CULTURE
Bacteria, UA: NONE SEEN /HPF
Bilirubin Urine: NEGATIVE
Glucose, UA: NEGATIVE
Hgb urine dipstick: NEGATIVE
Hyaline Cast: NONE SEEN /LPF
Ketones, ur: NEGATIVE
Leukocyte Esterase: NEGATIVE
Nitrites, Initial: NEGATIVE
RBC / HPF: NONE SEEN /HPF (ref 0–2)
Specific Gravity, Urine: 1.019 (ref 1.001–1.03)
WBC, UA: NONE SEEN /HPF (ref 0–5)
pH: 7.5 (ref 5.0–8.0)

## 2020-03-18 LAB — NO CULTURE INDICATED

## 2020-03-20 ENCOUNTER — Telehealth: Payer: Self-pay | Admitting: Family Medicine

## 2020-03-20 LAB — URINE CYTOLOGY ANCILLARY ONLY
Chlamydia: NEGATIVE
Comment: NEGATIVE
Comment: NORMAL
Neisseria Gonorrhea: NEGATIVE

## 2020-03-20 LAB — HEPATITIS C ANTIBODY
Hepatitis C Ab: NONREACTIVE
SIGNAL TO CUT-OFF: 0.01 (ref <1.00)

## 2020-03-20 LAB — HIV ANTIBODY (ROUTINE TESTING W REFLEX): HIV 1&2 Ab, 4th Generation: NONREACTIVE

## 2020-03-20 NOTE — Telephone Encounter (Signed)
Please call patient Liver, kidney and thyroid function are normal Blood cell counts and electrolytes are normal Diabetes screening/A1c is normal at 5.5 Cholesterol panel looks great with a total cholesterol 157, HDL 68, LDL 78 and triglycerides of 51. Hepatitis C screening and HIV screening are both negative.  Her urine studies were normal and did not require culturing under microanalysis.

## 2020-03-21 NOTE — Telephone Encounter (Signed)
Please advise why message was sent?

## 2020-03-23 NOTE — Telephone Encounter (Signed)
Spoke with pt regarding labs and instructions.   

## 2020-03-24 NOTE — Telephone Encounter (Signed)
Patient was concerned about getting a no show fee. She said the person that scheduled the appointment told her it could be done as a virtual visit. Please disregard note, looks like she was rescheduled.

## 2020-04-17 DIAGNOSIS — N200 Calculus of kidney: Secondary | ICD-10-CM | POA: Diagnosis not present

## 2020-04-17 DIAGNOSIS — N3020 Other chronic cystitis without hematuria: Secondary | ICD-10-CM | POA: Diagnosis not present

## 2020-05-18 ENCOUNTER — Telehealth: Payer: Self-pay | Admitting: Family Medicine

## 2020-05-18 DIAGNOSIS — Z01419 Encounter for gynecological examination (general) (routine) without abnormal findings: Secondary | ICD-10-CM

## 2020-05-18 NOTE — Telephone Encounter (Signed)
Ok to place referral for her.

## 2020-05-18 NOTE — Telephone Encounter (Signed)
Patient would like referral for [facility], Dr. Juliene Pina.

## 2020-05-18 NOTE — Addendum Note (Signed)
Addended by: Maxie Barb on: 05/18/2020 12:46 PM   Modules accepted: Orders

## 2020-05-18 NOTE — Telephone Encounter (Signed)
LVM to inform pt referral has been placed.

## 2020-06-02 DIAGNOSIS — Z124 Encounter for screening for malignant neoplasm of cervix: Secondary | ICD-10-CM | POA: Diagnosis not present

## 2020-06-02 DIAGNOSIS — Z319 Encounter for procreative management, unspecified: Secondary | ICD-10-CM | POA: Diagnosis not present

## 2020-06-02 DIAGNOSIS — Z118 Encounter for screening for other infectious and parasitic diseases: Secondary | ICD-10-CM | POA: Diagnosis not present

## 2020-06-02 DIAGNOSIS — Z01411 Encounter for gynecological examination (general) (routine) with abnormal findings: Secondary | ICD-10-CM | POA: Diagnosis not present

## 2020-06-02 DIAGNOSIS — Z6832 Body mass index (BMI) 32.0-32.9, adult: Secondary | ICD-10-CM | POA: Diagnosis not present

## 2020-06-02 DIAGNOSIS — L680 Hirsutism: Secondary | ICD-10-CM | POA: Diagnosis not present

## 2020-06-02 DIAGNOSIS — Z01419 Encounter for gynecological examination (general) (routine) without abnormal findings: Secondary | ICD-10-CM | POA: Diagnosis not present

## 2020-06-02 DIAGNOSIS — Z113 Encounter for screening for infections with a predominantly sexual mode of transmission: Secondary | ICD-10-CM | POA: Diagnosis not present

## 2020-06-02 LAB — HM PAP SMEAR: HM Pap smear: NEGATIVE

## 2020-06-02 LAB — OB RESULTS CONSOLE GC/CHLAMYDIA: Chlamydia: NEGATIVE

## 2020-06-02 LAB — RESULTS CONSOLE HPV: CHL HPV: NEGATIVE

## 2020-06-08 DIAGNOSIS — L680 Hirsutism: Secondary | ICD-10-CM | POA: Diagnosis not present

## 2020-06-08 DIAGNOSIS — Z319 Encounter for procreative management, unspecified: Secondary | ICD-10-CM | POA: Diagnosis not present

## 2020-06-20 DIAGNOSIS — Z319 Encounter for procreative management, unspecified: Secondary | ICD-10-CM | POA: Diagnosis not present

## 2020-06-20 DIAGNOSIS — L680 Hirsutism: Secondary | ICD-10-CM | POA: Diagnosis not present

## 2020-09-11 ENCOUNTER — Telehealth: Payer: Self-pay | Admitting: Family Medicine

## 2020-09-11 ENCOUNTER — Other Ambulatory Visit: Payer: Self-pay | Admitting: Family Medicine

## 2020-09-11 ENCOUNTER — Telehealth (INDEPENDENT_AMBULATORY_CARE_PROVIDER_SITE_OTHER): Payer: 59 | Admitting: Family Medicine

## 2020-09-11 ENCOUNTER — Encounter: Payer: Self-pay | Admitting: Family Medicine

## 2020-09-11 DIAGNOSIS — J029 Acute pharyngitis, unspecified: Secondary | ICD-10-CM

## 2020-09-11 DIAGNOSIS — J310 Chronic rhinitis: Secondary | ICD-10-CM

## 2020-09-11 DIAGNOSIS — J329 Chronic sinusitis, unspecified: Secondary | ICD-10-CM | POA: Diagnosis not present

## 2020-09-11 LAB — POCT RAPID STREP A (OFFICE): Rapid Strep A Screen: NEGATIVE

## 2020-09-11 LAB — POCT INFLUENZA A/B
Influenza A, POC: NEGATIVE
Influenza B, POC: NEGATIVE

## 2020-09-11 MED ORDER — FLUTICASONE PROPIONATE 50 MCG/ACT NA SUSP: 2.0000 | Freq: Every day | NASAL | 6 refills | Status: DC

## 2020-09-11 MED ORDER — DOXYCYCLINE HYCLATE 100 MG PO TABS: 100.0000 mg | ORAL_TABLET | Freq: Two times a day (BID) | ORAL | 0 refills | Status: DC

## 2020-09-11 MED ORDER — FLUCONAZOLE 150 MG PO TABS: 150.0000 mg | ORAL_TABLET | Freq: Once | ORAL | 0 refills | Status: AC

## 2020-09-11 MED ORDER — PREDNISONE 20 MG PO TABS: ORAL_TABLET | ORAL | 0 refills | Status: DC

## 2020-09-11 MED FILL — predniSONE 20 MG TABS: 20 | 10 days supply | Qty: 18 | Fill #0

## 2020-09-11 MED FILL — DOXYCYCLINE HYCLATE 100 MG: 100 | 10 days supply | Qty: 20 | Fill #0

## 2020-09-11 NOTE — Telephone Encounter (Signed)
Spoke with pt and informed her to transfer rx if needed

## 2020-09-11 NOTE — Telephone Encounter (Signed)
Please inform patient her strep test and influenza test are negative. Her symptoms are most likely viral in nature, possibly Covid, or other virus-but cannot rule out early sinus infection from bacteria considering her severity of sinus symptoms.  -Recommended the Flonase, Zyrtec, nasal saline and Mucinex for now.  Of course rest and hydrate.  Can use NSAIDs for headache and myalgias.  -I will call in a prescription for prednisone (steroid) and doxycycline (antibiotic)-given her severity of symptoms would encourage her to start the steroid today.  She can start the doxycycline (antibiotic) today or elect to wait 1 to 2 days to see if her symptoms improve without antibiotic or if her Covid test is positive, indicating viral illness over bacteria.

## 2020-09-11 NOTE — Telephone Encounter (Signed)
Spoke with pt regarding labs and instructions.   

## 2020-09-11 NOTE — Addendum Note (Signed)
Addended by: Maxie Barb on: 09/11/2020 03:14 PM   Modules accepted: Orders

## 2020-09-11 NOTE — Progress Notes (Signed)
VIRTUAL VISIT VIA VIDEO  I connected with Barbara Lutz on 09/11/20 at  1:30 PM EDT by elemedicine application and verified that I am speaking with the correct person using two identifiers. Location patient: Home Location provider: [facility], Office Persons participating in the virtual visit: Patient, Dr. Claiborne Billings and Reece Agar. [name], [name]  I discussed the limitations of evaluation and management by telemedicine and the availability of in person appointments. The patient expressed understanding and agreed to proceed.   SUBJECTIVE Chief Complaint  Patient presents with  . Cough    Pt c/o mild productive cough with greenish tint, sore throat, loss of taste, ear fullness/pain, nasal congestion, chills, body ache runny nose x 3 days; pt was took COVID test at [facility] today; At home test neg COIVD    HPI: Barbara Lutz is a 25 y.o. female present for acute illness. Pt report productive cough, night sweats, chills, sore throat, loss of taste and smell, decreased appetite,  ear pain, severe nasal congestion, body aches, runny nose- onset Friday night (3 days). She took a home covid test yesterday (day 1.5-2) and it was negative. She has a covid PCR completed at work ([employer]) this morning. Her father was also sick.  She has been using mucinex. She is not on an antihistamine.   ROS: See pertinent positives and negatives per HPI.  Patient Active Problem List   Diagnosis Date Noted  . History of pyelonephritis 07/23/2018  . Nicotine dependence, cigarettes, uncomplicated 07/23/2018  . Obesity (BMI 30-39.9) 07/23/2018  . Depression, major, recurrent, moderate (HCC) 05/25/2012    Social History   Tobacco Use  . Smoking status: Current Some Day Smoker    Packs/day: 0.00    Types: Cigarettes  . Smokeless tobacco: Never Used  Substance Use Topics  . Alcohol use: Yes    Comment: social    Current Outpatient Medications:  .  fluticasone (FLONASE) 50 MCG/ACT nasal spray, Place 2 sprays into both  nostrils daily., Disp: 16 g, Rfl: 6 .  ibuprofen (ADVIL) 600 MG tablet, Take 600 mg by mouth every 6 (six) hours as needed., Disp: , Rfl:   Allergies  Allergen Reactions  . Latex Rash  . Nickel Dermatitis    OBJECTIVE: There were no vitals taken for this visit. Gen: No acute distress. Nontoxic in appearance. Nasal congestion and runny nose present.  HENT: AT. [place].  MMM.  Eyes:Pupils Equal Round Reactive to light, Extraocular movements intact,  Conjunctiva without redness, discharge or icterus. Chest: Cough present Skin: no rashes, purpura or petechiae.  Neuro:  Normal gait. Alert. Oriented x3  Psych: Normal affect and demeanor. Normal speech. Normal thought content and judgment.  ASSESSMENT AND PLAN: Barbara Lutz is a 25 y.o. female present for  Sore throat/Rhinosinusitis Rest, hydrate.  Continue mucinex (DM if cough) Start flonase and   nettie pot or nasal saline.  Start zyrtec OTC  Testing arrranged: - POCT Influenza A/B - POCT rapid strep A [facility] completed covid test Results will be called to patient and plan further discussed. Pt aware this is possibly a viral illness and abx would not be indicated at this time. Could consider script for prednisone +/- doxy to hold in the event symptoms worsen> will wait for lab results.    [name], DO 09/11/2020   Return if symptoms worsen or fail to improve.  Orders Placed This Encounter  Procedures  . POCT Influenza A/B  . POCT rapid strep A   Meds ordered this encounter  Medications  .  fluticasone (FLONASE) 50 MCG/ACT nasal spray    Sig: Place 2 sprays into both nostrils daily.    Dispense:  16 g    Refill:  6   Referral Orders  No referral(s) requested today

## 2020-09-11 NOTE — Telephone Encounter (Signed)
Approve Diflucan 150 mg tablet x1.  Please make sure all of the meds are going to her pharmacy of choice.  Meds called in today would be doxycycline, Flonase, prednisone and now the Diflucan.

## 2020-09-11 NOTE — Telephone Encounter (Signed)
Pt would like fluconazole with abx in case of yeast

## 2020-09-11 NOTE — Patient Instructions (Signed)

## 2020-09-15 ENCOUNTER — Other Ambulatory Visit: Payer: Self-pay

## 2020-09-15 ENCOUNTER — Telehealth (INDEPENDENT_AMBULATORY_CARE_PROVIDER_SITE_OTHER): Payer: 59 | Admitting: Family Medicine

## 2020-09-15 ENCOUNTER — Other Ambulatory Visit: Payer: Self-pay | Admitting: Family Medicine

## 2020-09-15 ENCOUNTER — Other Ambulatory Visit (INDEPENDENT_AMBULATORY_CARE_PROVIDER_SITE_OTHER): Payer: 59

## 2020-09-15 ENCOUNTER — Encounter: Payer: Self-pay | Admitting: Family Medicine

## 2020-09-15 DIAGNOSIS — R310 Gross hematuria: Secondary | ICD-10-CM

## 2020-09-15 DIAGNOSIS — R319 Hematuria, unspecified: Secondary | ICD-10-CM

## 2020-09-15 LAB — POC URINALSYSI DIPSTICK (AUTOMATED)
Bilirubin, UA: NEGATIVE
Glucose, UA: NEGATIVE
Ketones, UA: 5
Nitrite, UA: NEGATIVE
Protein, UA: POSITIVE — AB
Spec Grav, UA: >=1.03 — AB (ref 1.010–1.025)
Urobilinogen, UA: 0.2 E.U./dL
pH, UA: 6 (ref 5.0–8.0)

## 2020-09-15 MED ORDER — CEFDINIR 300 MG PO CAPS: 300.0000 mg | ORAL_CAPSULE | Freq: Two times a day (BID) | ORAL | 0 refills | Status: DC

## 2020-09-15 MED FILL — CEFDINIR 300 MG CAPSULE: 300 | 7 days supply | Qty: 14 | Fill #0

## 2020-09-15 NOTE — Progress Notes (Signed)
This visit occurred during the SARS-CoV-2 public health emergency.  Safety protocols were in place, including screening questions prior to the visit, additional usage of staff PPE, and extensive cleaning of exam room while observing appropriate contact time as indicated for disinfecting solutions.    Barbara Lutz , Apr 25, 1996, 25 y.o., female MRN: [id number] Patient Care Team    Relationship Specialty Notifications Start End  Natalia Leatherwood, DO PCP - General Family Medicine  10/29/19     Chief Complaint  Patient presents with  . Hematuria    Pt c/o gross hematuria with low abd bloating x 6 hours     Subjective: Pt presents for an OV with complaints of 1 day of hematuria without dysuria. She endorses urinary frequency and lower abd bloating. She denies fever, nausea or chills. She is tolerating PO and overall feels well. She has not been sexually active.  Patient's last menstrual period was 09/08/2020 (exact date). Completed 09/10/2020.  Depression screen [facility] 2/9 09/11/2020 03/17/2020  Decreased Interest 0 1  Down, Depressed, Hopeless 0 1  PHQ - 2 Score 0 2  Altered sleeping 0 0  Tired, decreased energy 0 2  Change in appetite 0 1  Feeling bad or failure about yourself  0 0  Trouble concentrating 0 0  Moving slowly or fidgety/restless 0 0  Suicidal thoughts 0 0  PHQ-9 Score 0 5    Allergies  Allergen Reactions  . Latex Rash  . Nickel Dermatitis   Social History   Social History Narrative   Marital status/children/pets: Single.   Education/employment: GED.  Employed. Manager.   Safety:      -smoke alarm in the home:Yes     - wears seatbelt: Yes     - Feels safe in their relationships: Yes   Past Medical History:  Diagnosis Date  . Anxiety   . Depression   . History of pyelonephritis 07/23/2018  . Mucocele of lip 01/2020  . Obesity (BMI 30-39.9) 07/23/2018   [facility]  . Recurrent UTI 07/23/2018  . Thyroid disorder    Past Surgical History:   Procedure Laterality Date  . ORAL MUCOCELE EXCISION     Family History  Problem Relation Age of Onset  . COPD Mother   . Hypertension Mother   . Heart attack Father   . High Cholesterol Father   . Depression Sister   . Diabetes Maternal Grandfather   . Hearing loss Maternal Grandfather   . Heart attack Maternal Grandfather   . Heart disease Maternal Grandfather   . Hypertension Maternal Grandfather   . Hyperlipidemia Maternal Grandfather   . ADD / ADHD Paternal Grandfather   . Alcohol abuse Paternal Grandfather   . COPD Paternal Grandfather   . Diabetes Paternal Grandfather   . Hearing loss Paternal Grandfather   . Hyperlipidemia Paternal Grandfather   . Hypertension Paternal Grandfather   . Stroke Paternal Grandfather   . Asthma Sister   . Depression Sister   . Drug abuse Sister   . Breast cancer Maternal Grandmother   . Pancreatic cancer Paternal Grandmother   . Arthritis Paternal Grandmother   . Hypertension Paternal Grandmother    Allergies as of 09/15/2020      Reactions   Latex Rash   Nickel Dermatitis      Medication List       Accurate as of September 15, 2020  2:24 PM. If you have any questions, ask your nurse or doctor.  cefdinir 300 MG capsule Commonly known as: OMNICEF Take 1 capsule (300 mg total) by mouth 2 (two) times daily. Started by: [name], DO   doxycycline 100 MG tablet Commonly known as: VIBRA-TABS Take 1 tablet (100 mg total) by mouth 2 (two) times daily.   fluconazole 150 MG tablet Commonly known as: DIFLUCAN Take 150 mg by mouth once.   fluticasone 50 MCG/ACT nasal spray Commonly known as: FLONASE Place 2 sprays into both nostrils daily.   ibuprofen 600 MG tablet Commonly known as: ADVIL Take 600 mg by mouth every 6 (six) hours as needed.   predniSONE 20 MG tablet Commonly known as: DELTASONE 60 mg x3d, 40 mg x3d, 20 mg x2d, 10 mg x2d       All past medical history, surgical history, allergies, family history,  immunizations andmedications were updated in the EMR today and reviewed under the history and medication portions of their EMR.     ROS: Negative, with the exception of above mentioned in HPI   Objective:  LMP 09/08/2020 (Exact Date)  There is no height or weight on file to calculate BMI. Gen: Afebrile. No acute distress. Nontoxic in appearance, well developed, well nourished.  HENT: AT. [place].  Eyes:Pupils Equal Round Reactive to light, Extraocular movements intact,  Conjunctiva without redness, discharge or icterus. Chest:no cough or shortness of breath.  Abd: pt reports no pain to palpation.  Skin: no rashes, purpura or petechiae.  Neuro: Normal gait. PERLA. EOMi. Alert. Oriented x3  Psych: Normal affect, dress and demeanor. Normal speech. Normal thought content and judgment  No exam data present No results found. Results for orders placed or performed in visit on 09/15/20 (from the past 24 hour(s))  POCT Urinalysis Dipstick (Automated)     Status: Abnormal   Collection Time: 09/15/20 12:33 PM  Result Value Ref Range   Color, UA brown    Clarity, UA cloudy    Glucose, UA Negative Negative   Bilirubin, UA negative    Ketones, UA 5    Spec Grav, UA >=1.030 (A) 1.010 - 1.025   Blood, UA large    pH, UA 6.0 5.0 - 8.0   Protein, UA Positive (A) Negative   Urobilinogen, UA 0.2 0.2 or 1.0 E.U./dL   Nitrite, UA negative    Leukocytes, UA Trace (A) Negative    Assessment/Plan: Barbara Lutz is a 25 y.o. female present for OV for  Gross hematuria Pt with h/o pyelo hospitalization in the past. There has been a question if she has kidney stones in the past. She currently is not having any pain of fever. She has recently had a viral illness (non-covid)- no rash reported or back pain.   Poss. IC?  Elected to tx with omnicef BID x7 days while waiting on her culture.  She was instructed on how to treat kidney stone at home- if symptoms were to occur.  - POCT Urinalysis Dipstick -  Urinalysis w microscopic + reflex cultur   Reviewed expectations re: course of current medical issues.  Discussed self-management of symptoms.  Outlined signs and symptoms indicating need for more acute intervention.  Patient verbalized understanding and all questions were answered.  Patient received an After-Visit Summary.    Orders Placed This Encounter  Procedures  . Urinalysis w microscopic + reflex cultur  . POCT Urinalysis Dipstick   Meds ordered this encounter  Medications  . cefdinir (OMNICEF) 300 MG capsule    Sig: Take 1 capsule (300 mg total) by mouth 2 (  two) times daily.    Dispense:  14 capsule    Refill:  0   Referral Orders  No referral(s) requested today     Note is dictated utilizing voice recognition software. Although note has been proof read prior to signing, occasional typographical errors still can be missed. If any questions arise, please do not hesitate to call for verification.   electronically signed by:  [name], DO  [facility] [place]

## 2020-09-17 LAB — URINE CULTURE
MICRO NUMBER:: 11725163
Result:: NO GROWTH
SPECIMEN QUALITY:: ADEQUATE

## 2020-09-17 LAB — URINALYSIS W MICROSCOPIC + REFLEX CULTURE
Bilirubin Urine: NEGATIVE
Glucose, UA: NEGATIVE
Ketones, ur: NEGATIVE
Nitrites, Initial: NEGATIVE
Specific Gravity, Urine: 1.03 (ref 1.001–1.03)
pH: 6 (ref 5.0–8.0)

## 2020-09-17 LAB — CULTURE INDICATED

## 2020-09-18 ENCOUNTER — Other Ambulatory Visit (HOSPITAL_BASED_OUTPATIENT_CLINIC_OR_DEPARTMENT_OTHER): Payer: Self-pay

## 2020-09-18 ENCOUNTER — Telehealth: Payer: Self-pay | Admitting: Family Medicine

## 2020-09-18 MED ORDER — TAMSULOSIN HCL 0.4 MG PO CAPS
0.4000 mg | ORAL_CAPSULE | Freq: Every day | ORAL | 0 refills | Status: DC
  Filled 2020-09-18: qty 30, 30d supply, fill #0

## 2020-09-18 NOTE — Telephone Encounter (Signed)
Please encourage pt to take naproxen/aleve BID with food. Thi smed helps with inflammation- tylenol will not help inflammation.   I also called in flomax for her to start once daily - take until pain resolves OR kidney stone passes (if she has one), which ever comes first.

## 2020-09-18 NOTE — Addendum Note (Signed)
Addended by: [name] A on: 09/18/2020 09:03 AM   Modules accepted: Orders

## 2020-09-18 NOTE — Telephone Encounter (Signed)
Spoke with pt who stated she has developed urine freq and a throbbing pain that has not resolved with Tylenol. Pt was encourage to make an in office appt to have pain evaluated and to continue to take abx. Pt agreed with plan and will sched an appt at later time. Pt was informed to she is also to have an in office 4 wk f/u

## 2020-09-18 NOTE — Telephone Encounter (Signed)
Please inform patient her urine results do not show signs of infection by culture.  There was significant blood in her urine.  Please ask patient how she is feeling today?  If she developed any symptoms of kidney stones?  I would encourage her to complete the antibiotics for treatment of at least possible cystitis.  If she has any pain I would like to see her in the office for an exam.  If pain-free and no other symptoms start such as fever, chills etc., then please have her follow-up in office in 4 weeks so we can repeat urine to ensure hematuria is resolved.  If not resolved then we would need to consider imaging of the kidneys and urology referral.

## 2020-09-18 NOTE — Telephone Encounter (Signed)
Spoke with pt and informed her of vaginal throbbing. Pt states has been taking some naproxen as of recently.

## 2020-09-21 ENCOUNTER — Emergency Department (HOSPITAL_BASED_OUTPATIENT_CLINIC_OR_DEPARTMENT_OTHER)
Admission: EM | Admit: 2020-09-21 | Discharge: 2020-09-21 | Disposition: A | Discharge status: Home / Self Care | Payer: 59 | Attending: Emergency Medicine | Admitting: Emergency Medicine

## 2020-09-21 ENCOUNTER — Emergency Department (HOSPITAL_BASED_OUTPATIENT_CLINIC_OR_DEPARTMENT_OTHER): Payer: 59

## 2020-09-21 ENCOUNTER — Other Ambulatory Visit (HOSPITAL_BASED_OUTPATIENT_CLINIC_OR_DEPARTMENT_OTHER): Payer: Self-pay

## 2020-09-21 ENCOUNTER — Other Ambulatory Visit: Payer: Self-pay

## 2020-09-21 DIAGNOSIS — N201 Calculus of ureter: Secondary | ICD-10-CM | POA: Diagnosis not present

## 2020-09-21 DIAGNOSIS — F17210 Nicotine dependence, cigarettes, uncomplicated: Secondary | ICD-10-CM | POA: Diagnosis not present

## 2020-09-21 DIAGNOSIS — M419 Scoliosis, unspecified: Secondary | ICD-10-CM | POA: Diagnosis not present

## 2020-09-21 DIAGNOSIS — R109 Unspecified abdominal pain: Secondary | ICD-10-CM | POA: Diagnosis present

## 2020-09-21 DIAGNOSIS — Z91040 Latex allergy status: Secondary | ICD-10-CM | POA: Insufficient documentation

## 2020-09-21 DIAGNOSIS — M549 Dorsalgia, unspecified: Secondary | ICD-10-CM | POA: Insufficient documentation

## 2020-09-21 LAB — CBC WITH DIFFERENTIAL/PLATELET
Abs Immature Granulocytes: 0.03 10*3/uL (ref 0.00–0.07)
Basophils Absolute: 0 10*3/uL (ref 0.0–0.1)
Basophils Relative: 0 %
Eosinophils Absolute: 0 10*3/uL (ref 0.0–0.5)
Eosinophils Relative: 0 %
HCT: 38.9 % (ref 36.0–46.0)
Hemoglobin: 13.5 g/dL (ref 12.0–15.0)
Immature Granulocytes: 0 %
Lymphocytes Relative: 14 %
Lymphs Abs: 1.4 10*3/uL (ref 0.7–4.0)
MCH: 31.8 pg (ref 26.0–34.0)
MCHC: 34.7 g/dL (ref 30.0–36.0)
MCV: 91.5 fL (ref 80.0–100.0)
Monocytes Absolute: 0.6 10*3/uL (ref 0.1–1.0)
Monocytes Relative: 6 %
Neutro Abs: 8 10*3/uL — ABNORMAL HIGH (ref 1.7–7.7)
Neutrophils Relative %: 80 %
Platelets: 271 10*3/uL (ref 150–400)
RBC: 4.25 MIL/uL (ref 3.87–5.11)
RDW: 12.1 % (ref 11.5–15.5)
WBC: 10 10*3/uL (ref 4.0–10.5)
nRBC: 0 % (ref 0.0–0.2)

## 2020-09-21 LAB — LIPASE, BLOOD: Lipase: 42 U/L (ref 11–51)

## 2020-09-21 LAB — COMPREHENSIVE METABOLIC PANEL
ALT: 15 U/L (ref 0–44)
AST: 20 U/L (ref 15–41)
Albumin: 4.2 g/dL (ref 3.5–5.0)
Alkaline Phosphatase: 48 U/L (ref 38–126)
Anion gap: 8 (ref 5–15)
BUN: 12 mg/dL (ref 6–20)
CO2: 23 mmol/L (ref 22–32)
Calcium: 9.1 mg/dL (ref 8.9–10.3)
Chloride: 107 mmol/L (ref 98–111)
Creatinine, Ser: 0.84 mg/dL (ref 0.44–1.00)
GFR, Estimated: >60 mL/min (ref >60)
Glucose, Bld: 97 mg/dL (ref 70–99)
Potassium: 4.2 mmol/L (ref 3.5–5.1)
Sodium: 138 mmol/L (ref 135–145)
Total Bilirubin: 0.5 mg/dL (ref 0.3–1.2)
Total Protein: 6.9 g/dL (ref 6.5–8.1)

## 2020-09-21 LAB — URINALYSIS, ROUTINE W REFLEX MICROSCOPIC
Bilirubin Urine: NEGATIVE
Glucose, UA: NEGATIVE mg/dL
Ketones, ur: NEGATIVE mg/dL
Leukocytes,Ua: NEGATIVE
Nitrite: NEGATIVE
Protein, ur: NEGATIVE mg/dL
Specific Gravity, Urine: 1.01 (ref 1.005–1.030)
pH: 7.5 (ref 5.0–8.0)

## 2020-09-21 LAB — URINALYSIS, MICROSCOPIC (REFLEX)

## 2020-09-21 LAB — PREGNANCY, URINE: Preg Test, Ur: NEGATIVE

## 2020-09-21 MED ORDER — HYDROMORPHONE HCL 1 MG/ML IJ SOLN
1.0000 mg | Freq: Once | INTRAMUSCULAR | Status: AC
  Administered 2020-09-21: 1 mg via INTRAVENOUS
  Filled 2020-09-21: qty 1

## 2020-09-21 MED ORDER — MORPHINE SULFATE 15 MG PO TABS
15.0000 mg | ORAL_TABLET | ORAL | 0 refills | Status: DC | PRN
  Filled 2020-09-21: qty 10, 2d supply, fill #0

## 2020-09-21 MED ORDER — ONDANSETRON HCL 4 MG/2ML IJ SOLN
4.0000 mg | Freq: Once | INTRAMUSCULAR | Status: AC
  Administered 2020-09-21: 4 mg via INTRAVENOUS
  Filled 2020-09-21: qty 2

## 2020-09-21 MED ORDER — SODIUM CHLORIDE 0.9 % IV BOLUS
1000.0000 mL | Freq: Once | INTRAVENOUS | Status: AC
  Administered 2020-09-21: 1000 mL via INTRAVENOUS

## 2020-09-21 NOTE — ED Notes (Signed)
MD @ [facility]

## 2020-09-21 NOTE — ED Provider Notes (Signed)
[facility] EMERGENCY DEPARTMENT Provider Note   CSN: [id number] Arrival date & time: 09/21/20  8032     History Chief Complaint  Patient presents with  . Abdominal Pain    Barbara Lutz is a 25 y.o. female.  HPI 25 year old female presents with severe back pain.  Started in the middle the night.  She was previously seen by PCP at the end of last week for hematuria and was treated with Flomax.  Urine also sent for culture which was negative.  However now she is all of a sudden having right-sided back/flank pain going into her abdomen.  Some discomfort on the left side as well.  The pain is a 10.  Took some Tylenol with no relief.  Has vomited a couple times.  No fevers, respiratory symptoms.   Past Medical History:  Diagnosis Date  . Anxiety   . Depression   . History of pyelonephritis 07/23/2018  . Mucocele of lip 01/2020  . Obesity (BMI 30-39.9) 07/23/2018   [facility]  . Recurrent UTI 07/23/2018  . Thyroid disorder     Patient Active Problem List   Diagnosis Date Noted  . History of pyelonephritis 07/23/2018  . Nicotine dependence, cigarettes, uncomplicated 07/23/2018  . Obesity (BMI 30-39.9) 07/23/2018  . Depression, major, recurrent, moderate (HCC) 05/25/2012    Past Surgical History:  Procedure Laterality Date  . ORAL MUCOCELE EXCISION       OB History   No obstetric history on file.     Family History  Problem Relation Age of Onset  . COPD Mother   . Hypertension Mother   . Heart attack Father   . High Cholesterol Father   . Depression Sister   . Diabetes Maternal Grandfather   . Hearing loss Maternal Grandfather   . Heart attack Maternal Grandfather   . Heart disease Maternal Grandfather   . Hypertension Maternal Grandfather   . Hyperlipidemia Maternal Grandfather   . ADD / ADHD Paternal Grandfather   . Alcohol abuse Paternal Grandfather   . COPD Paternal Grandfather   . Diabetes Paternal Grandfather   . Hearing loss Paternal  Grandfather   . Hyperlipidemia Paternal Grandfather   . Hypertension Paternal Grandfather   . Stroke Paternal Grandfather   . Asthma Sister   . Depression Sister   . Drug abuse Sister   . Breast cancer Maternal Grandmother   . Pancreatic cancer Paternal Grandmother   . Arthritis Paternal Grandmother   . Hypertension Paternal Grandmother     Social History   Tobacco Use  . Smoking status: Current Some Day Smoker    Packs/day: 0.00    Types: Cigarettes  . Smokeless tobacco: Never Used  Vaping Use  . Vaping Use: Every day  Substance Use Topics  . Alcohol use: Yes    Comment: social  . Drug use: No    Home Medications Prior to Admission medications   Medication Sig Start Date End Date Taking? Authorizing Provider  morphine (MSIR) 15 MG tablet Take 1 tablet (15 mg total) by mouth every 4 (four) hours as needed for severe pain. 09/21/20  Yes Pricilla Loveless, MD  tamsulosin (FLOMAX) 0.4 MG CAPS capsule Take 1 capsule (0.4 mg total) by mouth daily. 09/18/20  Yes Kuneff, Renee A, DO  cefdinir (OMNICEF) 300 MG capsule TAKE 1 CAPSULE (300 MG TOTAL) BY MOUTH 2 (TWO) TIMES DAILY. 09/15/20 09/15/21  Kuneff, Renee A, DO  fluticasone (FLONASE) 50 MCG/ACT nasal spray Place 2 sprays into both nostrils  daily. 09/11/20   Kuneff, Renee A, DO  ibuprofen (ADVIL) 600 MG tablet Take 600 mg by mouth every 6 (six) hours as needed.    [provider]  predniSONE (DELTASONE) 20 MG tablet TAKE 3 TABLETS BY MOUTH DAILY FOR 3 DAYS, THEN 2 TABLETS DAILY FOR 3 DAYS, THEN 1 TABLET DAILY FOR 2 DAYS, THEN 1/2 TABLET DAILY FOR 2 DAYS 09/11/20 09/11/21  [name] A, DO    Allergies    Latex and Nickel  Review of Systems   Review of Systems  Constitutional: Negative for fever.  Gastrointestinal: Positive for abdominal pain and vomiting.  Genitourinary: Positive for flank pain and hematuria.  Musculoskeletal: Positive for back pain.  All other systems reviewed and are negative.   Physical Exam Updated  Vital Signs BP 115/64 (BP Location: Right Arm)   Pulse 70   Temp 98 F (36.7 C)   Resp 15   Ht 5\' 2"  (1.575 m)   Wt 83.9 kg   LMP 09/08/2020 (Exact Date)   SpO2 99%   BMI 33.84 kg/m   Physical Exam Vitals and nursing note reviewed.  Constitutional:      Appearance: She is well-developed. She is not ill-appearing or diaphoretic.  HENT:     Head: Normocephalic and atraumatic.     Right Ear: External ear normal.     Left Ear: External ear normal.     Nose: Nose normal.  Eyes:     General:        Right eye: No discharge.        Left eye: No discharge.  Cardiovascular:     Rate and Rhythm: Normal rate and regular rhythm.     Heart sounds: Normal heart sounds.  Pulmonary:     Effort: Pulmonary effort is normal.     Breath sounds: Normal breath sounds.  Abdominal:     Palpations: Abdomen is soft.     Tenderness: There is no abdominal tenderness. There is right CVA tenderness.  Skin:    General: Skin is warm and dry.  Neurological:     Mental Status: She is alert.  Psychiatric:        Mood and Affect: Mood is not anxious.     ED Results / Procedures / Treatments   Labs (all labs ordered are listed, but only abnormal results are displayed) Labs Reviewed  CBC WITH DIFFERENTIAL/PLATELET - Abnormal; Notable for the following components:      Result Value   Neutro Abs 8.0 (*)    All other components within normal limits  URINALYSIS, ROUTINE W REFLEX MICROSCOPIC - Abnormal; Notable for the following components:   Hgb urine dipstick SMALL (*)    All other components within normal limits  URINALYSIS, MICROSCOPIC (REFLEX) - Abnormal; Notable for the following components:   Bacteria, UA RARE (*)    All other components within normal limits  COMPREHENSIVE METABOLIC PANEL  LIPASE, BLOOD  PREGNANCY, URINE    EKG None  Radiology DG Abdomen 1 View  Result Date: 09/21/2020 CLINICAL DATA:  Clinical concern for right-sided stone. EXAM: ABDOMEN - 1 VIEW COMPARISON:  CT,  03/10/2020. FINDINGS: Normal bowel gas pattern. Small density projects in the right pelvis. Although this could reflect a distal right ureteral stone, it may reflect a small phlebolith noted in the right lower pelvis on the prior CT. No other evidence a ureteral stone. No evidence of a renal stone. Soft tissues are otherwise unremarkable. Mild dextroscoliosis of the lumbar spine, stable. IMPRESSION: 1.  Small density in the right lower pelvis could reflect a distal right ureteral stone, but more likely corresponds to a right lower pelvis phlebolith noted on the prior CT. No other significant finding. Electronically Signed   By: Amie Portland M.D.   On: 09/21/2020 09:00    Procedures Procedures   Medications Ordered in ED Medications  HYDROmorphone (DILAUDID) injection 1 mg (1 mg Intravenous Given 09/21/20 0823)  sodium chloride 0.9 % bolus 1,000 mL (1,000 mLs Intravenous New Bag/Given 09/21/20 0825)  ondansetron (ZOFRAN) injection 4 mg (4 mg Intravenous Given 09/21/20 8527)    ED Course  I have reviewed the triage vital signs and the nursing notes.  Pertinent labs & imaging results that were available during my care of the patient were reviewed by me and considered in my medical decision making (see chart for details).    MDM Rules/Calculators/A&P                          Clinically patient has a right ureteral stone.  She is feeling complete pain relief.  On multiple reexams she has no abdominal pain or tenderness.  Given the pain seem to primarily start in the right flank and go to her right lower abdomen I think this is likely stone.  She did have a small stone on the CT last year.  We discussed risk/benefits of imaging and at this point I do not think it is warranted as I have a lower suspicion for appendicitis, torsion, or acute intra-abdominal emergency.  I think at this point we can treat her symptomatically, especially given the hematuria seen.  We discussed return precautions, especially if  not improving but otherwise will discharge home to follow-up with urology. Final Clinical Impression(s) / ED Diagnoses Final diagnoses:  Right ureteral stone    Rx / DC Orders ED Discharge Orders         Ordered    morphine (MSIR) 15 MG tablet  Every 4 hours PRN        09/21/20 7824           Pricilla Loveless, MD 09/21/20 1020

## 2020-09-21 NOTE — ED Notes (Signed)
Pt mother @ [facility]  Pt advised of ED process - has call bell and advised that urine sample is needed Has blanket and lights turned down

## 2020-09-21 NOTE — Discharge Instructions (Addendum)
Your pain today is most consistent with a kidney stone.  If you develop fever, new or worsening pain, pain that does not go away, intractable vomiting, urinary tract infection symptoms, or any other new/concerning symptoms then return to the ER.

## 2020-09-21 NOTE — ED Notes (Signed)
Pt PCP was seen of Friday - blood in urine  Prescribed flomax and steroids Today presents with 10/10 pain RLQ and flank pain. Woke up at 2am took tylenol - no help C/O N/V/D and pain

## 2020-09-21 NOTE — ED Notes (Signed)
Pt to Xray -  Pt mother given ice water

## 2020-09-21 NOTE — ED Notes (Signed)
Urine sent to lab 

## 2020-09-25 ENCOUNTER — Encounter: Payer: Self-pay | Admitting: Family Medicine

## 2020-09-25 ENCOUNTER — Other Ambulatory Visit (HOSPITAL_BASED_OUTPATIENT_CLINIC_OR_DEPARTMENT_OTHER): Payer: Self-pay

## 2020-09-25 MED ORDER — TAMSULOSIN HCL 0.4 MG PO CAPS
0.4000 mg | ORAL_CAPSULE | Freq: Every day | ORAL | 0 refills | Status: DC
  Filled 2020-09-25: qty 30, 30d supply, fill #0

## 2020-09-26 ENCOUNTER — Other Ambulatory Visit (HOSPITAL_BASED_OUTPATIENT_CLINIC_OR_DEPARTMENT_OTHER): Payer: Self-pay

## 2020-10-06 ENCOUNTER — Other Ambulatory Visit (HOSPITAL_BASED_OUTPATIENT_CLINIC_OR_DEPARTMENT_OTHER): Payer: Self-pay

## 2020-10-09 DIAGNOSIS — N202 Calculus of kidney with calculus of ureter: Secondary | ICD-10-CM | POA: Diagnosis not present

## 2020-10-09 DIAGNOSIS — N3020 Other chronic cystitis without hematuria: Secondary | ICD-10-CM | POA: Diagnosis not present

## 2020-11-01 ENCOUNTER — Telehealth: Payer: 59 | Admitting: Family Medicine

## 2020-11-23 ENCOUNTER — Encounter: Payer: Self-pay | Admitting: Family Medicine

## 2021-03-02 ENCOUNTER — Ambulatory Visit: Payer: 59 | Admitting: Family Medicine

## 2021-03-02 ENCOUNTER — Encounter: Payer: Self-pay | Admitting: Family Medicine

## 2021-03-02 ENCOUNTER — Other Ambulatory Visit (HOSPITAL_BASED_OUTPATIENT_CLINIC_OR_DEPARTMENT_OTHER): Payer: Self-pay

## 2021-03-02 ENCOUNTER — Other Ambulatory Visit: Payer: Self-pay

## 2021-03-02 VITALS — BP 130/75 | HR 76 | Temp 98.7°F | Wt 170.0 lb

## 2021-03-02 DIAGNOSIS — F411 Generalized anxiety disorder: Secondary | ICD-10-CM

## 2021-03-02 MED ORDER — CITALOPRAM HYDROBROMIDE 20 MG PO TABS
20.0000 mg | ORAL_TABLET | Freq: Every day | ORAL | 3 refills | Status: DC
  Filled 2021-03-02: qty 30, 30d supply, fill #0

## 2021-03-02 NOTE — Patient Instructions (Addendum)
Please consider counseling. Contact [phone] to schedule an appointment or inquire about cost/insurance coverage.  Aim to do some physical exertion for 150 minutes per week. This is typically divided into 5 days per week, 30 minutes per day. The activity should be enough to get your heart rate up. Anything is better than nothing if you have time constraints.  Take 1/2 tab daily of the Celexa for 2 weeks.   Coping skills Choose 5 that work for you: Take a deep breath Count to 20 Read a book Do a puzzle Meditate Bake Sing Knit Garden Pray Go outside Call a friend Listen to music Take a walk Color Send a note Take a bath Watch a movie Be alone in a quiet place Pet an animal Visit a friend Journal Exercise Stretch
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
## 2021-03-19 ENCOUNTER — Other Ambulatory Visit (HOSPITAL_BASED_OUTPATIENT_CLINIC_OR_DEPARTMENT_OTHER): Payer: Self-pay

## 2021-03-19 ENCOUNTER — Ambulatory Visit (INDEPENDENT_AMBULATORY_CARE_PROVIDER_SITE_OTHER): Payer: 59 | Admitting: Family Medicine

## 2021-03-19 ENCOUNTER — Other Ambulatory Visit: Payer: Self-pay

## 2021-03-19 ENCOUNTER — Encounter: Payer: Self-pay | Admitting: Family Medicine

## 2021-03-19 VITALS — BP 108/70 | HR 71 | Temp 98.2°F | Ht 63.0 in | Wt 177.0 lb

## 2021-03-19 DIAGNOSIS — B360 Pityriasis versicolor: Secondary | ICD-10-CM

## 2021-03-19 DIAGNOSIS — F17210 Nicotine dependence, cigarettes, uncomplicated: Secondary | ICD-10-CM | POA: Diagnosis not present

## 2021-03-19 DIAGNOSIS — E669 Obesity, unspecified: Secondary | ICD-10-CM

## 2021-03-19 DIAGNOSIS — Z131 Encounter for screening for diabetes mellitus: Secondary | ICD-10-CM

## 2021-03-19 DIAGNOSIS — F411 Generalized anxiety disorder: Secondary | ICD-10-CM | POA: Diagnosis not present

## 2021-03-19 DIAGNOSIS — Z0001 Encounter for general adult medical examination with abnormal findings: Secondary | ICD-10-CM

## 2021-03-19 HISTORY — DX: Pityriasis versicolor: B36.0

## 2021-03-19 LAB — LIPID PANEL
Cholesterol: 143 mg/dL (ref 0–200)
HDL: 81.1 mg/dL (ref >39.00)
LDL Cholesterol: 50 mg/dL (ref 0–99)
NonHDL: 61.57
Total CHOL/HDL Ratio: 2
Triglycerides: 57 mg/dL (ref 0.0–149.0)
VLDL: 11.4 mg/dL (ref 0.0–40.0)

## 2021-03-19 LAB — TSH: TSH: 1.09 u[IU]/mL (ref 0.35–5.50)

## 2021-03-19 LAB — HEMOGLOBIN A1C: Hgb A1c MFr Bld: 5.2 % (ref 4.6–6.5)

## 2021-03-19 MED ORDER — KETOCONAZOLE 2 % EX SHAM
MEDICATED_SHAMPOO | CUTANEOUS | 0 refills | Status: DC
  Filled 2021-03-19: qty 120, 30d supply, fill #0

## 2021-03-19 MED ORDER — CITALOPRAM HYDROBROMIDE 20 MG PO TABS
20.0000 mg | ORAL_TABLET | Freq: Every day | ORAL | 1 refills | Status: DC
  Filled 2021-03-19 – 2021-04-04 (×2): qty 90, 90d supply, fill #0
  Filled 2021-08-04: qty 90, 90d supply, fill #1

## 2021-03-19 NOTE — Patient Instructions (Addendum)
Great to see you today.  I have refilled the medication(s) we provide.   If labs were collected, we will inform you of lab results once received either by echart message or telephone call.   - echart message- for normal results that have been seen by the patient already.   - telephone call: abnormal results or if patient has not viewed results in their echart.   Health Maintenance, Female Adopting a healthy lifestyle and getting preventive care are important in promoting health and wellness. Ask your health care provider about: The right schedule for you to have regular tests and exams. Things you can do on your own to prevent diseases and keep yourself healthy. What should I know about diet, weight, and exercise? Eat a healthy diet  Eat a diet that includes plenty of vegetables, fruits, low-fat dairy products, and lean protein. Do not eat a lot of foods that are high in solid fats, added sugars, or sodium. Maintain a healthy weight Body mass index (BMI) is used to identify weight problems. It estimates body fat based on height and weight. Your health care provider can help determine your BMI and help you achieve or maintain a healthy weight. Get regular exercise Get regular exercise. This is one of the most important things you can do for your health. Most adults should: Exercise for at least 150 minutes each week. The exercise should increase your heart rate and make you sweat (moderate-intensity exercise). Do strengthening exercises at least twice a week. This is in addition to the moderate-intensity exercise. Spend less time sitting. Even light physical activity can be beneficial. Watch cholesterol and blood lipids Have your blood tested for lipids and cholesterol at 25 years of age, then have this test every 5 years. Have your cholesterol levels checked more often if: Your lipid or cholesterol levels are high. You are older than 25 years of age. You are at high risk for heart  disease. What should I know about cancer screening? Depending on your health history and family history, you may need to have cancer screening at various ages. This may include screening for: Breast cancer. Cervical cancer. Colorectal cancer. Skin cancer. Lung cancer. What should I know about heart disease, diabetes, and high blood pressure? Blood pressure and heart disease High blood pressure causes heart disease and increases the risk of stroke. This is more likely to develop in people who have high blood pressure readings, are of African descent, or are overweight. Have your blood pressure checked: Every 3-5 years if you are 22-50 years of age. Every year if you are 73 years old or older. Diabetes Have regular diabetes screenings. This checks your fasting blood sugar level. Have the screening done: Once every three years after age 43 if you are at a normal weight and have a low risk for diabetes. More often and at a younger age if you are overweight or have a high risk for diabetes. What should I know about preventing infection? Hepatitis B If you have a higher risk for hepatitis B, you should be screened for this virus. Talk with your health care provider to find out if you are at risk for hepatitis B infection. Hepatitis C Testing is recommended for: Everyone born from [birth year] through 1965. Anyone with known risk factors for hepatitis C. Sexually transmitted infections (STIs) Get screened for STIs, including gonorrhea and chlamydia, if: You are sexually active and are younger than 25 years of age. You are older than 25 years of age  and your health care provider tells you that you are at risk for this type of infection. Your sexual activity has changed since you were last screened, and you are at increased risk for chlamydia or gonorrhea. Ask your health care provider if you are at risk. Ask your health care provider about whether you are at high risk for HIV. Your health care provider  may recommend a prescription medicine to help prevent HIV infection. If you choose to take medicine to prevent HIV, you should first get tested for HIV. You should then be tested every 3 months for as long as you are taking the medicine. Pregnancy If you are about to stop having your period (premenopausal) and you may become pregnant, seek counseling before you get pregnant. Take 400 to 800 micrograms (mcg) of folic acid every day if you become pregnant. Ask for birth control (contraception) if you want to prevent pregnancy. Osteoporosis and menopause Osteoporosis is a disease in which the bones lose minerals and strength with aging. This can result in bone fractures. If you are 82 years old or older, or if you are at risk for osteoporosis and fractures, ask your health care provider if you should: Be screened for bone loss. Take a calcium or vitamin D supplement to lower your risk of fractures. Be given hormone replacement therapy (HRT) to treat symptoms of menopause. Follow these instructions at home: Lifestyle Do not use any products that contain nicotine or tobacco, such as cigarettes, e-cigarettes, and chewing tobacco. If you need help quitting, ask your health care provider. Do not use street drugs. Do not share needles. Ask your health care provider for help if you need support or information about quitting drugs. Alcohol use Do not drink alcohol if: Your health care provider tells you not to drink. You are pregnant, may be pregnant, or are planning to become pregnant. If you drink alcohol: Limit how much you use to 0-1 drink a day. Limit intake if you are breastfeeding. Be aware of how much alcohol is in your drink. In the [place] one drink equals one 12 oz bottle of beer (355 mL), one 5 oz glass of wine (148 mL), or one 1 oz glass of hard liquor (44 mL). General instructions Schedule regular health, dental, and eye exams. Stay current with your vaccines. Tell your health care  provider if: You often feel depressed. You have ever been abused or do not feel safe at home. Summary Adopting a healthy lifestyle and getting preventive care are important in promoting health and wellness. Follow your health care provider's instructions about healthy diet, exercising, and getting tested or screened for diseases. Follow your health care provider's instructions on monitoring your cholesterol and blood pressure. This information is not intended to replace advice given to you by your health care provider. Make sure you discuss any questions you have with your health care provider. Document Revised: 08/11/2020 Document Reviewed: 05/27/2018 Elsevier Patient Education  2022 [employer].   If you are age 9 or older, your body mass index should be between 23-30. Your Body mass index is Body mass index is 31.35 kg/m.Marland Kitchen If this is above the aforementioned range listed, you are consider overweight and if BMI > 35 you are conisdered obese, by medical definition and standards. Routine daily exercise and dietary modifications are encouraged. If you would like additional guidance on weight loss, please make an appointment for weight loss counseling - must be an appointment dedicate to weight loss counseling alone. I would  be happy to help you.   If you are age 47 or younger, your body mass index should be between 19-25. Your Body mass index is Body mass index is 31.35 kg/m. Marland Kitchen If this is above the aformentioned range listed, you are consider overwieght and obese if BMI > 30, by medical definition and standards. Routine daily exercise and dietary modifications are encouraged. If you would like additional guidance on weight loss, please make an appointment for weight loss counseling - must be an appointment dedicate to weight loss counseling alone. I would be happy to help you.

## 2021-03-19 NOTE — Progress Notes (Signed)
This visit occurred during the SARS-CoV-2 public health emergency.  Safety protocols were in place, including screening questions prior to the visit, additional usage of staff PPE, and extensive cleaning of exam room while observing appropriate contact time as indicated for disinfecting solutions.    Patient ID: Barbara Lutz, female  DOB: 1996-01-24, 25 y.o.   MRN: [id number] Patient Care Team    Relationship Specialty Notifications Start End  Ma Hillock, DO PCP - General Family Medicine  10/29/19     Chief Complaint  Patient presents with   Annual Exam    Pt is fasting    Subjective: Barbara Lutz is a 25 y.o.  Female  present for CPE/acute cpmpliant. All past medical history, surgical history, allergies, family history, immunizations, medications and social history were updated in the electronic medical record today. All recent labs, ED visits and hospitalizations within the last year were reviewed.  Health maintenance:  Colonoscopy: no fhx. routine screen at 45 Mammogram: FHX breast cancer in [place]. Routine screen at 40.  Cervical cancer screening: last pap: 2021- gyn Immunizations: tdap UTD 2018, Influenza 2022 (encouraged yearly), covid series completed Infectious disease screening: HIV and Hep C completed today, along with urine cytology for std screen DEXA:routine screen Assistive device: none Oxygen WUJ:WJXB Patient has a Dental home. Hospitalizations/ED visits: reviewed  Depression screen [facility] 2/9 03/19/2021 09/11/2020 03/17/2020  Decreased Interest 1 0 1  Down, Depressed, Hopeless 1 0 1  PHQ - 2 Score 2 0 2  Altered sleeping 1 0 0  Tired, decreased energy 1 0 2  Change in appetite 2 0 1  Feeling bad or failure about yourself  1 0 0  Trouble concentrating 0 0 0  Moving slowly or fidgety/restless 0 0 0  Suicidal thoughts 0 0 0  PHQ-9 Score 7 0 5   GAD 7 : Generalized Anxiety Score 03/19/2021 03/17/2020  Nervous, Anxious, on Edge 3 3  Control/stop worrying 3 3   Worry too much - different things 3 3  Trouble relaxing 2 2  Restless 0 0  Easily annoyed or irritable 1 1  Afraid - awful might happen 0 0  Total GAD 7 Score 12 12   Immunization History  Administered Date(s) Administered   DTaP 03/19/1996, 05/17/1996, 08/03/1996, 08/19/1997, 09/25/2000   DTaP / HiB 03/19/1996, 05/17/1996, 08/03/1996, 02/01/1997   HPV Quadrivalent 03/30/2009, 03/07/2010, 07/03/2010   Hepatitis A, Ped/Adol-2 Dose 03/10/2008, 03/30/2009   Hepatitis B, ped/adol Dec 17, 1995, 03/19/1996, 11/25/1996   IPV 03/19/1996, 05/17/1996, 08/19/1997, 09/25/2000   Influenza Inj Mdck Quad Pf 02/15/2018   Influenza, Seasonal, Injecte, Preservative Fre 04/19/2016   Influenza,inj,Quad PF,6+ Mos 04/19/2016, 04/19/2016, 03/18/2017, 03/22/2019   Influenza,inj,quad, With Preservative 03/10/2008, 03/30/2009, 03/07/2010   Influenza-Unspecified 03/10/2008, 03/30/2009, 03/07/2010, 03/12/2021   MMR 02/01/1997, 09/25/2000   Meningococcal Conjugate 03/10/2008, 02/14/2014   Moderna Sars-Covid-2 Vaccination 02/05/2020, 03/04/2020   Pneumococcal-Unspecified 07/09/1999   Td 02/06/2007, 03/18/2017   Tdap 02/06/2007   Varicella 02/01/1997, 03/10/2008     Past Medical History:  Diagnosis Date   Anxiety    Depression    Depression, major, recurrent, moderate ([place]) 05/25/2012   History of pyelonephritis 07/23/2018   Mucocele of lip 01/2020   Obesity (BMI 30-39.9) 07/23/2018   [facility]   Recurrent UTI 07/23/2018   Thyroid disorder    Allergies  Allergen Reactions   Latex Rash   Nickel Dermatitis   Past Surgical History:  Procedure Laterality Date   ORAL MUCOCELE EXCISION     Family History  Problem Relation Age of Onset   COPD Mother    Hypertension Mother    Heart attack Father    High Cholesterol Father    Depression Sister    Diabetes Maternal Grandfather    Hearing loss Maternal Grandfather    Heart attack Maternal Grandfather    Heart disease Maternal Grandfather     Hypertension Maternal Grandfather    Hyperlipidemia Maternal Grandfather    ADD / ADHD Paternal Grandfather    Alcohol abuse Paternal Grandfather    COPD Paternal Grandfather    Diabetes Paternal Grandfather    Hearing loss Paternal Grandfather    Hyperlipidemia Paternal Grandfather    Hypertension Paternal Grandfather    Stroke Paternal Grandfather    Asthma Sister    Depression Sister    Drug abuse Sister    Breast cancer Maternal Grandmother    Pancreatic cancer Paternal Grandmother    Arthritis Paternal Grandmother    Hypertension Paternal Grandmother    Social History   Social History Narrative   Marital status/children/pets: Single.   Education/employment: GED.  Employed. Manager.   Safety:      -smoke alarm in the home:Yes     - wears seatbelt: Yes     - Feels safe in their relationships: Yes    Allergies as of 03/19/2021       Reactions   Latex Rash   Nickel Dermatitis        Medication List        Accurate as of March 19, 2021  9:20 AM. If you have any questions, ask your nurse or doctor.          citalopram 20 MG tablet Commonly known as: CELEXA Take 1 tablet (20 mg total) by mouth daily.   ketoconazole 2 % shampoo Commonly known as: NIZORAL Apply over affected area, lather, and let stand for 5 min. Rinse. Repeat twice a week for weeks. Started by: Howard Pouch, DO        All past medical history, surgical history, allergies, family history, immunizations andmedications were updated in the EMR today and reviewed under the history and medication portions of their EMR.     No results found for this or any previous visit (from the past 2160 hour(s)).  DG Abdomen 1 View Result Date: 09/21/2020 IMPRESSION: 1. Small density in the right lower pelvis could reflect a distal right ureteral stone, but more likely corresponds to a right lower pelvis phlebolith noted on the prior CT. No other significant finding. Electronically Signed   By: Lajean Manes M.D.   On: 09/21/2020 09:00   ROS: 14 pt review of systems performed and negative (unless mentioned in an HPI)  Objective: BP 108/70   Pulse 71   Temp 98.2 F (36.8 C) (Oral)   Ht '5\' 3"'  (1.6 m)   Wt 177 lb (80.3 kg)   SpO2 99%   BMI 31.35 kg/m  Gen: Afebrile. No acute distress. Nontoxic in appearance, well-developed, well-nourished, very pleasant mildly obese female. HENT: AT. [place]. Bilateral TM visualized and normal in appearance, normal external auditory canal. MMM, no oral lesions, adequate dentition. Bilateral nares within normal limits. Throat without erythema, ulcerations or exudates.  No cough on exam, no hoarseness on exam. Eyes:Pupils Equal Round Reactive to light, Extraocular movements intact,  Conjunctiva without redness, discharge or icterus. Neck/lymp/endocrine: Supple, no lymphadenopathy, no thyromegaly CV: RRR no murmur, no edema, +2/4 P posterior tibialis pulses.  Chest: CTAB, no wheeze, rhonchi or crackles.  Normal  respiratory effort.  Good air movement. Abd: Soft.  Flat. NTND. BS present.  No masses palpated. No hepatosplenomegaly. No rebound tenderness or guarding. Skin: Hyperpigmented splotchy flat rash over upper back and shoulders, no purpura or petechiae. Warm and well-perfused. Skin intact. Neuro/Msk: Normal gait. PERLA. EOMi. Alert. Oriented x3.  Cranial nerves II through XII intact. Muscle strength 5/5 upper/lower extremity. DTRs equal bilaterally. Psych: Normal affect, dress and demeanor. Normal speech. Normal thought content and judgment.   No results found.  Assessment/plan: Barbara Lutz is a 25 y.o. female present for CPE. GAD (generalized anxiety disorder) - stable. Started by another provider last mo. For anxiety. Pt tolerating medication and feels it is helpful.  - TSH - continue citalopram (CELEXA) 20 MG tablet qd - f/u 5.5 mos.   Diabetes mellitus screening - Hemoglobin A1c  Obesity (BMI 30-39.9) - routine exercise is encouraged -  Lipid panel  Nicotine dependence:  She has started to cut back on her intake, but admits she is still smoking and vaping.  She is not ready to quit yet.  Pt understands smoking cessation is recommended and she will try.   Pityriasis versicolor Ketoconazole shampoo prescribed with instruction on use.   Encounter for general adult medical examination with abnormal findings Colonoscopy: no fhx. routine screen at 79 Mammogram: [place] breast cancer in [place]. Routine screen at 40.  Cervical cancer screening: last pap: 2021- gyn Immunizations: tdap UTD 2018, Influenza 2022 (encouraged yearly), covid series completed Infectious disease screening: HIV and Hep C completed today, along with urine cytology for std screen DEXA:routine screen Patient was encouraged to exercise greater than 150 minutes a week. Patient was encouraged to choose a diet filled with fresh fruits and vegetables, and lean meats. AVS provided to patient today for education/recommendation on gender specific health and safety maintenance.  Return in about 6 months (around 09/17/2021) for CMC (30 min).   Orders Placed This Encounter  Procedures   Hemoglobin A1c   Lipid panel   TSH   Meds ordered this encounter  Medications   citalopram (CELEXA) 20 MG tablet    Sig: Take 1 tablet (20 mg total) by mouth daily.    Dispense:  90 tablet    Refill:  1   ketoconazole (NIZORAL) 2 % shampoo    Sig: Apply over affected area, lather, and let stand for 5 min. Rinse. Repeat twice a week for weeks.    Dispense:  120 mL    Refill:  0   Referral Orders  No referral(s) requested today     Electronically signed by: Howard Pouch, [place]

## 2021-03-26 ENCOUNTER — Encounter: Payer: Self-pay | Admitting: Family Medicine

## 2021-03-26 NOTE — Telephone Encounter (Signed)
Please advise if there is possible changes with VV.

## 2021-03-28 NOTE — Telephone Encounter (Signed)
As long as insurance continues to allow VV at that time, it is ok with me

## 2021-04-04 ENCOUNTER — Other Ambulatory Visit (HOSPITAL_BASED_OUTPATIENT_CLINIC_OR_DEPARTMENT_OTHER): Payer: Self-pay

## 2021-04-10 ENCOUNTER — Encounter: Payer: Self-pay | Admitting: Family Medicine

## 2021-04-10 NOTE — Telephone Encounter (Signed)
Please advise if pt needs appt.

## 2021-04-10 NOTE — Telephone Encounter (Signed)
Please advise 

## 2021-04-10 NOTE — Telephone Encounter (Signed)
vv

## 2021-04-10 NOTE — Telephone Encounter (Signed)
Okay to place Derm referral for patient if that is what she desires.  Please make sure she is aware it can take  3-4 months to get into dermatology.  If she would like to be seen here and get started on treatment, I would be happy to help her with that.

## 2021-04-20 ENCOUNTER — Telehealth: Payer: 59 | Admitting: Family Medicine

## 2021-04-25 ENCOUNTER — Telehealth: Payer: Self-pay

## 2021-04-25 NOTE — Telephone Encounter (Signed)
Pt needs letter with what meds and that she taking and stating that she is prescribed meds from our office. Please advise if letter is okay.

## 2021-04-26 NOTE — Telephone Encounter (Signed)
ok to provide letter

## 2021-04-30 NOTE — Telephone Encounter (Signed)
Letter sent via mychart. Pt aware.

## 2021-05-03 ENCOUNTER — Ambulatory Visit: Payer: 59 | Admitting: Family Medicine

## 2021-05-03 ENCOUNTER — Other Ambulatory Visit (HOSPITAL_BASED_OUTPATIENT_CLINIC_OR_DEPARTMENT_OTHER): Payer: Self-pay

## 2021-05-03 ENCOUNTER — Other Ambulatory Visit: Payer: Self-pay

## 2021-05-03 VITALS — BP 118/60 | HR 81 | Temp 98.5°F | Resp 18 | Ht 62.0 in | Wt 178.2 lb

## 2021-05-03 DIAGNOSIS — L02415 Cutaneous abscess of right lower limb: Secondary | ICD-10-CM

## 2021-05-03 MED ORDER — DOXYCYCLINE HYCLATE 100 MG PO CAPS
100.0000 mg | ORAL_CAPSULE | Freq: Two times a day (BID) | ORAL | 0 refills | Status: DC
  Filled 2021-05-03: qty 14, 7d supply, fill #0

## 2021-05-03 MED ORDER — FLUCONAZOLE 150 MG PO TABS
150.0000 mg | ORAL_TABLET | Freq: Once | ORAL | 0 refills | Status: AC
  Filled 2021-05-03: qty 2, 2d supply, fill #0

## 2021-05-03 NOTE — Patient Instructions (Signed)
Please keep the wound clean and dry until tomorrow- then you can shower as usual and replace dressing to protect the area.  The packing should come out easily- leave in for 1-2 days and then remove if it did not come out yet  Let me know if any sign of worsening or any bleeding that does not resolve with a few minutes of pressure!

## 2021-05-03 NOTE — Progress Notes (Signed)
[facility] [address] [place], [place] [address] [phone]  Date:  05/03/2021   Name:  Barbara Lutz   DOB:  23-Feb-1996   MRN:  [id number]  PCP:  Natalia Leatherwood, DO    Chief Complaint: bump on the leg (Inside of right thigh x 3 weeks, continues to worsen. )   History of Present Illness:  Barbara Lutz is a 25 y.o. very pleasant female patient who presents with the following:  Generally healthy young woman here today with a possible infection on her right inner thigh- it has been there for 3 weeks or so, seem to start with an ingrown hair She was able to pop it and got some pus out about 2.5 weeks ago- then it seemed to recur and is quite tender She has tried to drain at home again without success It is getting larger and is more tender Never had this in the past She is generally in good health No flu like sx or other concerns today, she otherwise feels well  She notes no chance of pregnancy at the moment  Patient Active Problem List   Diagnosis Date Noted   Pityriasis versicolor 03/19/2021   GAD (generalized anxiety disorder) 03/19/2021   History of pyelonephritis 07/23/2018   Nicotine dependence, cigarettes, uncomplicated 07/23/2018   Obesity (BMI 30-39.9) 07/23/2018    Past Medical History:  Diagnosis Date   Anxiety    Depression    Depression, major, recurrent, moderate ([facility]) 05/25/2012   History of pyelonephritis 07/23/2018   Mucocele of lip 01/2020   Obesity (BMI 30-39.9) 07/23/2018   [facility]   Recurrent UTI 07/23/2018   Thyroid disorder     Past Surgical History:  Procedure Laterality Date   ORAL MUCOCELE EXCISION      Social History   Tobacco Use   Smoking status: Some Days    Packs/day: 0.00    Types: Cigarettes   Smokeless tobacco: Never  Vaping Use   Vaping Use: Every day  Substance Use Topics   Alcohol use: Yes    Comment: social   Drug use: No    Family  History  Problem Relation Age of Onset   COPD Mother    Hypertension Mother    Heart attack Father    High Cholesterol Father    Depression Sister    Diabetes Maternal Grandfather    Hearing loss Maternal Grandfather    Heart attack Maternal Grandfather    Heart disease Maternal Grandfather    Hypertension Maternal Grandfather    Hyperlipidemia Maternal Grandfather    ADD / ADHD Paternal Grandfather    Alcohol abuse Paternal Grandfather    COPD Paternal Grandfather    Diabetes Paternal Grandfather    Hearing loss Paternal Grandfather    Hyperlipidemia Paternal Grandfather    Hypertension Paternal Grandfather    Stroke Paternal Grandfather    Asthma Sister    Depression Sister    Drug abuse Sister    Breast cancer Maternal Grandmother    Pancreatic cancer Paternal Grandmother    Arthritis Paternal Grandmother    Hypertension Paternal Grandmother     Allergies  Allergen Reactions   Latex Rash   Nickel Dermatitis    Medication list has been reviewed and updated.  Current Outpatient Medications on File Prior to Visit  Medication Sig Dispense Refill   citalopram (CELEXA) 20 MG tablet Take 1 tablet (20 mg total) by mouth daily.  90 tablet 1   No current facility-administered medications on file prior to visit.    Review of Systems:  As per HPI- otherwise negative.   Physical Examination: Vitals:   05/03/21 0827  BP: 118/60  Pulse: 81  Resp: 18  Temp: 98.5 F (36.9 C)  SpO2: 99%   Vitals:   05/03/21 0827  Weight: 178 lb 3.2 oz (80.8 kg)  Height: 5\' 2"  (1.575 m)   Body mass index is 32.59 kg/m. Ideal Body Weight: Weight in (lb) to have BMI = 25: 136.4  GEN: No acute distress; alert,appropriate. PULM: Breathing comfortably in no respiratory distress PSYCH: Normally interactive.  Looks well, no distress Right inner thigh displays an abscess approximately 2 cm in diameter with fluctuance  Verbal consent obtained.  Area prepped with Betadine scrub.   Injected approximately 2 mL of 1% lidocaine with epinephrine for anesthesia.  Used 11 blade for incision, drained a small amount of pus.  Placed approximately 1 inch of quarter inch packing material into the wound and placed dressing  Patient tolerated well with no immediate complications  Had patient wait in office for about 30 minutes for wound check, good hemostasis-no active bleeding  Assessment and Plan: Abscess of right leg - Plan: doxycycline (VIBRAMYCIN) 100 MG capsule, fluconazole (DIFLUCAN) 150 MG tablet  Patient seen today as above for an abscess of the right thigh, treated with I&D with packing Wound care discussed verbally and patient given written instructions She is asked to contact me right away if any concerns or significant bleeding  Signed , MD

## 2021-05-25 ENCOUNTER — Telehealth: Payer: 59 | Admitting: Family Medicine

## 2021-05-30 ENCOUNTER — Other Ambulatory Visit: Payer: Self-pay | Admitting: Family Medicine

## 2021-05-30 ENCOUNTER — Telehealth (INDEPENDENT_AMBULATORY_CARE_PROVIDER_SITE_OTHER): Payer: 59 | Admitting: Family Medicine

## 2021-05-30 ENCOUNTER — Other Ambulatory Visit (HOSPITAL_BASED_OUTPATIENT_CLINIC_OR_DEPARTMENT_OTHER): Payer: Self-pay

## 2021-05-30 ENCOUNTER — Encounter: Payer: Self-pay | Admitting: Family Medicine

## 2021-05-30 ENCOUNTER — Other Ambulatory Visit: Payer: Self-pay

## 2021-05-30 DIAGNOSIS — E669 Obesity, unspecified: Secondary | ICD-10-CM

## 2021-05-30 DIAGNOSIS — Z713 Dietary counseling and surveillance: Secondary | ICD-10-CM | POA: Diagnosis not present

## 2021-05-30 DIAGNOSIS — L700 Acne vulgaris: Secondary | ICD-10-CM

## 2021-05-30 MED ORDER — CLINDAMYCIN PHOSPHATE 1 % EX GEL
Freq: Two times a day (BID) | CUTANEOUS | 5 refills | Status: DC
  Filled 2021-05-30: qty 30, 20d supply, fill #0

## 2021-05-30 MED ORDER — CLINDAMYCIN PHOSPHATE 1 % EX SOLN
Freq: Two times a day (BID) | CUTANEOUS | 5 refills | Status: DC
  Filled 2021-05-30: qty 60, 30d supply, fill #0

## 2021-05-30 NOTE — Progress Notes (Signed)
This visit occurred during the SARS-CoV-2 public health emergency.  Safety protocols were in place, including screening questions prior to the visit, additional usage of staff PPE, and extensive cleaning of exam room while observing appropriate contact time as indicated for disinfecting solutions.    Barbara Lutz , 1995-08-12, 25 y.o., female MRN: [id number] Patient Care Team    Relationship Specialty Notifications Start End  Natalia Leatherwood, DO PCP - General Family Medicine  10/29/19     Chief Complaint  Patient presents with   Acne    Pt reports acne on chins worsening in the last 1.5 mos     Subjective: Pt presents for an OV with complaints of worsening acne over the last 1.5 months. She reports many deep cystic acne area on her chin. She also has many "black heads" surrounding her chin. She feels this occurred with mask wearing. She did not have acne much as a child/teenager. She is not on BCP. She also endorses increased stress recently.   Depression screen [facility] 2/9 03/19/2021 09/11/2020 03/17/2020  Decreased Interest 1 0 1  Down, Depressed, Hopeless 1 0 1  PHQ - 2 Score 2 0 2  Altered sleeping 1 0 0  Tired, decreased energy 1 0 2  Change in appetite 2 0 1  Feeling bad or failure about yourself  1 0 0  Trouble concentrating 0 0 0  Moving slowly or fidgety/restless 0 0 0  Suicidal thoughts 0 0 0  PHQ-9 Score 7 0 5    Allergies  Allergen Reactions   Latex Rash   Nickel Dermatitis   Social History   Social History Narrative   Marital status/children/pets: Single.   Education/employment: GED.  Employed. Manager.   Safety:      -smoke alarm in the home:Yes     - wears seatbelt: Yes     - Feels safe in their relationships: Yes   Past Medical History:  Diagnosis Date   Anxiety    COVID-19    Depression    Depression, major, recurrent, moderate ([facility]) 05/25/2012   History of pyelonephritis 07/23/2018   Mucocele of lip 01/2020   Obesity (BMI 30-39.9) 07/23/2018    [facility]   Recurrent UTI 07/23/2018   Thyroid disorder    Past Surgical History:  Procedure Laterality Date   ORAL MUCOCELE EXCISION     Family History  Problem Relation Age of Onset   COPD Mother    Hypertension Mother    Heart attack Father    High Cholesterol Father    Depression Sister    Diabetes Maternal Grandfather    Hearing loss Maternal Grandfather    Heart attack Maternal Grandfather    Heart disease Maternal Grandfather    Hypertension Maternal Grandfather    Hyperlipidemia Maternal Grandfather    ADD / ADHD Paternal Grandfather    Alcohol abuse Paternal Grandfather    COPD Paternal Grandfather    Diabetes Paternal Grandfather    Hearing loss Paternal Grandfather    Hyperlipidemia Paternal Grandfather    Hypertension Paternal Grandfather    Stroke Paternal Grandfather    Asthma Sister    Depression Sister    Drug abuse Sister    Breast cancer Maternal Grandmother    Pancreatic cancer Paternal Grandmother    Arthritis Paternal Grandmother    Hypertension Paternal Grandmother    Allergies as of 05/30/2021       Reactions   Latex Rash   Nickel Dermatitis  Medication List        Accurate as of May 30, 2021 12:57 PM. If you have any questions, ask your nurse or doctor.          citalopram 20 MG tablet Commonly known as: CELEXA Take 1 tablet (20 mg total) by mouth daily.   doxycycline 100 MG capsule Commonly known as: VIBRAMYCIN Take 1 capsule (100 mg total) by mouth 2 (two) times daily.        All past medical history, surgical history, allergies, family history, immunizations andmedications were updated in the EMR today and reviewed under the history and medication portions of their EMR.     ROS Negative, with the exception of above mentioned in HPI   Objective:  There were no vitals taken for this visit. There is no height or weight on file to calculate BMI.  Physical Exam  Gen: Afebrile. No acute  distress. Nontoxic in appearance, well developed, well nourished.  HENT: AT. [place].  Eyes:Pupils Equal Round Reactive to light, Extraocular movements intact,  Conjunctiva without redness, discharge or icterus. Skin: no rashes, purpura or petechiae. Closed comedone along chin line, open comedones in patches along chin line.   Neuro: Normal gait. PERLA. EOMi. Alert. Oriented x3  Psych: Normal affect, dress and demeanor. Normal speech. Normal thought content and judgment.  No results found. No results found. No results found for this or any previous visit (from the past 24 hour(s)).  Assessment/Plan: Barbara Lutz is a 25 y.o. female present for OV for  Acne vulgaris Continue cetaphil wash Start clingagel at night and in 7 days may use BID if tolerating.  Will follow up at weight loss appt and consider adding adapalene if needed or epiduo.   Weight loss counseling, encounter for/Obesity (BMI 30-39.9) Briefly discussed her desire for weight loss counseling today and medications that are available to help her in her weight loss journey.  She will make an appt for weight loss counseling and bring a 2 week food log/diary with her to the appt.   Reviewed expectations re: course of current medical issues. Discussed self-management of symptoms. Outlined signs and symptoms indicating need for more acute intervention. Patient verbalized understanding and all questions were answered. Patient received an After-Visit Summary.    No orders of the defined types were placed in this encounter.  No orders of the defined types were placed in this encounter.  Referral Orders  No referral(s) requested today     Note is dictated utilizing voice recognition software. Although note has been proof read prior to signing, occasional typographical errors still can be missed. If any questions arise, please do not hesitate to call for verification.   electronically signed by:  [name], DO  [facility] [place]

## 2021-05-30 NOTE — Telephone Encounter (Signed)
Please advise 

## 2021-05-30 NOTE — Patient Instructions (Signed)
Acne Acne is a skin problem that causes pimples and other skin changes. The skin has many tiny openings called pores. Each pore contains an oil gland. Oil glands make an oily substance that is called sebum. Acne occurs when the pores in the skin get blocked. The pores may become infected with bacteria, or they may become red, sore, and swollen. Acne is a common skin problem, especially for teenagers. It often occurs on the face, neck, chest, upper arms, and back. Acne usually goes away over time. What are the causes? Acne is caused when oil glands get blocked with sebum, dead skin cells, and dirt. The bacteria that are normally found in the oil glands then multiply and cause inflammation. Acne is commonly triggered by changes in your hormones. These hormonal changes can cause the oil glands to get bigger and to make more sebum. Factors that can make acne worse include: Hormone changes during: Adolescence. Women's menstrual cycles. Pregnancy. Oil-based cosmetics and hair products. Stress. Hormone problems that are caused by certain diseases. Certain medicines. Pressure from headbands, backpacks, or shoulder pads. Exposure to certain oils and chemicals. Eating a diet high in carbohydrates that quickly turn to sugar. These include dairy products, desserts, and chocolates. What increases the risk? This condition is more likely to develop in: Teenagers. People who have a family history of acne. What are the signs or symptoms? Symptoms include: Small, red bumps (pimples or papules). Whiteheads. Blackheads. Small, pus-filled pimples (pustules). Big, red pimples or pustules that feel tender. More severe acne can cause: An abscess. This is an infected area that contains a collection of pus. Cysts. These are hard, painful, fluid-filled sacs. Scars. These can happen after large pimples heal. How is this diagnosed? This condition is diagnosed with a medical history and physical exam. Blood tests  may also be done. How is this treated? Treatment for this condition can vary depending on the severity of your acne. Treatment may include: Creams and lotions that prevent oil glands from clogging. Creams and lotions that treat or prevent infections and inflammation. Antibiotic medicines that are applied to the skin or taken as a pill. Pills that decrease sebum production. Birth control pills. Light or laser treatments. Injections of medicine into the affected areas. Chemicals that cause peeling of the skin. Surgery. Your health care provider will also recommend the best way to take care of your skin. Good skin care is the most important part of treatment. Follow these instructions at home: Skin care Take care of your skin as told by your health care provider. You may be told to do these things: Wash your skin gently at least two times each day, as well as: After you exercise. Before you go to bed. Use mild soap. Apply a water-based skin moisturizer after you wash your skin. Use a sunscreen or sunblock with SPF 30 or greater. This is especially important if you are using acne medicines. Choose cosmetics that will not block your oil glands (are noncomedogenic). Medicines Take over-the-counter and prescription medicines only as told by your health care provider. If you were prescribed an antibiotic medicine, apply it or take it as told by your health care provider. Do not stop using the antibiotic even if your condition improves. General instructions Keep your hair clean and off your face. If you have oily hair, shampoo your hair regularly or daily. Avoid wearing tight headbands or hats. Avoid picking or squeezing your pimples. That can make your acne worse and cause scarring. Shave gently and   only when necessary. Keep a food journal to figure out if any foods are linked to your acne. Avoid dairy products, desserts, and chocolates. Take steps to manage and reduce stress. Keep all  follow-up visits as told by your health care provider. This is important. Contact a health care provider if: Your acne is not better after eight weeks. Your acne gets worse. You have a large area of skin that is red or tender. You think that you are having side effects from any acne medicine. Summary Acne is a skin problem that causes pimples and other skin changes. Acne is a common skin problem, especially for teenagers. Acne usually goes away over time. Acne is commonly triggered by changes in your hormones. There are many other causes, such as stress, diet, and certain medicines. Follow your health care provider's instructions for how to take care of your skin. Good skin care is the most important part of treatment. Take over-the-counter and prescription medicines only as told by your health care provider. Contact your health care provider if you think that you are having side effects from any acne medicine. This information is not intended to replace advice given to you by your health care provider. Make sure you discuss any questions you have with your health care provider. Document Revised: 10/14/2017 Document Reviewed: 10/14/2017 Elsevier Patient Education  2022 [employer]  

## 2021-05-31 ENCOUNTER — Other Ambulatory Visit (HOSPITAL_BASED_OUTPATIENT_CLINIC_OR_DEPARTMENT_OTHER): Payer: Self-pay

## 2021-07-02 ENCOUNTER — Telehealth: Payer: Self-pay

## 2021-07-02 NOTE — Telephone Encounter (Signed)
Okay to move patient.

## 2021-07-02 NOTE — Telephone Encounter (Signed)
Appt changed

## 2021-07-02 NOTE — Telephone Encounter (Signed)
Pt asking to move her appt from friday at 8am to [place] 1/18 at 4pm?  Please advise if approved

## 2021-07-04 ENCOUNTER — Ambulatory Visit (INDEPENDENT_AMBULATORY_CARE_PROVIDER_SITE_OTHER): Payer: No Typology Code available for payment source | Admitting: Family Medicine

## 2021-07-04 ENCOUNTER — Encounter: Payer: Self-pay | Admitting: Family Medicine

## 2021-07-04 ENCOUNTER — Other Ambulatory Visit: Payer: Self-pay

## 2021-07-04 ENCOUNTER — Other Ambulatory Visit (HOSPITAL_BASED_OUTPATIENT_CLINIC_OR_DEPARTMENT_OTHER): Payer: Self-pay

## 2021-07-04 VITALS — BP 132/74 | HR 67 | Temp 98.2°F | Ht 62.0 in | Wt 186.4 lb

## 2021-07-04 DIAGNOSIS — Z713 Dietary counseling and surveillance: Secondary | ICD-10-CM | POA: Diagnosis not present

## 2021-07-04 DIAGNOSIS — E669 Obesity, unspecified: Secondary | ICD-10-CM

## 2021-07-04 DIAGNOSIS — L700 Acne vulgaris: Secondary | ICD-10-CM

## 2021-07-04 MED ORDER — ADAPALENE 0.1 % EX GEL
Freq: Every day | CUTANEOUS | 5 refills | Status: DC
  Filled 2021-07-04: qty 45, 30d supply, fill #0

## 2021-07-04 MED ORDER — BENZOYL PEROXIDE 10 % EX FOAM
CUTANEOUS | 3 refills | Status: DC
  Filled 2021-07-04: qty 156, fill #0

## 2021-07-04 NOTE — Patient Instructions (Addendum)
°  Have your labs collected fasting at [place].    Avoid 5 whites.  Continue water consumption  Add Fiber: twice a day supplement Morning: berries or meat/egg Lunch salad (vinegar and seasoning).  Dinner: lean meat/veggie    Low glycemic veggies.   1000 cal. to start for 2 weeks, then 1200.  [employer] fitness pal  30 minutes a day of cardiovascular (HR up 130-160)

## 2021-07-04 NOTE — Progress Notes (Signed)
This visit occurred during the SARS-CoV-2 public health emergency.  Safety protocols were in place, including screening questions prior to the visit, additional usage of staff PPE, and extensive cleaning of exam room while observing appropriate contact time as indicated for disinfecting solutions.    Barbara Lutz , 1996-06-14, 26 y.o., female MRN: [id number] Patient Care Team    Relationship Specialty Notifications Start End  Natalia Leatherwood, DO PCP - General Family Medicine  10/29/19     Chief Complaint  Patient presents with   Follow-up    Follow up on bumps on chin, per patient sometime it seems as if they are improving but other times they are bad. Discuss weight loss methods and anxiety meds.      Subjective: Barbara Lutz is a 26 y.o. female present to discuss- Weight loss/Body mass index is 34.09 kg/m./obesity Body mass index is 34.09 kg/m. Weight: 186.5 pounds Patient presented today to discuss weight loss and obesity. She reports for breakfast she usually eats oatmeal, [employer] sandwich, fruit such as pineapple or sometimes peanuts.  She usually has a glass of orange juice or milk in the morning.  For snacks she may have a small her she gets.  For lunch she mostly eats salads, with salad dressing and a protein-usually chicken breast.  Sometimes for lunch she will eat a preprepared "TV dinner.  ".  For dinner she usually eats a TV dinner or a piece of lean chicken.  For vegetables she tends to gravitate towards corn, peas or sometimes green beans or broccoli with cheese.  She tries to avoid potatoes but does eat them on occasion.  She is very good about hydrating and takes in about 100 ounces of water daily.  She does not take a fiber supplement.  She exercises about 30 minutes a day with resistance bands and dumbbells.  She does not perform cardio but does try to take the stairs for the remaining of her lunch in order to get her steps in.  She does not count calories or  monitor heart rate with her exercising.  Acne: Patient reports she saw very mild improvement in her acne, but it is still present with multiple comedones.  She has been using the clindamycin gel nightly and cleaning her face twice daily with Cetaphil wash.  She is still using make-up on her face, under her mask. Prior: Pt presents for an OV with complaints of worsening acne over the last 1.5 months. She reports many deep cystic acne area on her chin. She also has many "black heads" surrounding her chin. She feels this occurred with mask wearing. She did not have acne much as a child/teenager. She is not on BCP. She also endorses increased stress recently.   Depression screen [facility] 2/9 07/04/2021 03/19/2021 09/11/2020 03/17/2020  Decreased Interest 0 1 0 1  Down, Depressed, Hopeless 1 1 0 1  PHQ - 2 Score 1 2 0 2  Altered sleeping - 1 0 0  Tired, decreased energy - 1 0 2  Change in appetite - 2 0 1  Feeling bad or failure about yourself  - 1 0 0  Trouble concentrating - 0 0 0  Moving slowly or fidgety/restless - 0 0 0  Suicidal thoughts - 0 0 0  PHQ-9 Score - 7 0 5    Allergies  Allergen Reactions   Latex Rash   Nickel Dermatitis   Social History   Social History Narrative   Marital status/children/pets: Single.  Education/employment: GED.  Employed. Manager.   Safety:      -smoke alarm in the home:Yes     - wears seatbelt: Yes     - Feels safe in their relationships: Yes   Past Medical History:  Diagnosis Date   Anxiety    COVID-19    Depression    Depression, major, recurrent, moderate (HCC) 05/25/2012   History of pyelonephritis 07/23/2018   Mucocele of lip 01/2020   Obesity (BMI 30-39.9) 07/23/2018   [facility]   Pityriasis versicolor 03/19/2021   Recurrent UTI 07/23/2018   Thyroid disorder    Past Surgical History:  Procedure Laterality Date   ORAL MUCOCELE EXCISION     Family History  Problem Relation Age of Onset   COPD Mother    Hypertension  Mother    Heart attack Father    High Cholesterol Father    Depression Sister    Diabetes Maternal Grandfather    Hearing loss Maternal Grandfather    Heart attack Maternal Grandfather    Heart disease Maternal Grandfather    Hypertension Maternal Grandfather    Hyperlipidemia Maternal Grandfather    ADD / ADHD Paternal Grandfather    Alcohol abuse Paternal Grandfather    COPD Paternal Grandfather    Diabetes Paternal Grandfather    Hearing loss Paternal Grandfather    Hyperlipidemia Paternal Grandfather    Hypertension Paternal Grandfather    Stroke Paternal Grandfather    Asthma Sister    Depression Sister    Drug abuse Sister    Breast cancer Maternal Grandmother    Pancreatic cancer Paternal Grandmother    Arthritis Paternal Grandmother    Hypertension Paternal Grandmother    Allergies as of 07/04/2021       Reactions   Latex Rash   Nickel Dermatitis        Medication List        Accurate as of July 04, 2021 11:59 PM. If you have any questions, ask your nurse or doctor.          adapalene 0.1 % gel Commonly known as: DIFFERIN Apply topically at bedtime. Started by: Felix Pacinienee Lacreshia Bondarenko, DO   Benzoyl Peroxide 10 % Foam Wash face nightly with foam Started by: Felix Pacinienee Tomma Ehinger, DO   citalopram 20 MG tablet Commonly known as: CELEXA Take 1 tablet (20 mg total) by mouth daily.   clindamycin 1 % external solution Commonly known as: CLEOCIN T Apply on to the skin 2 (two) times daily.   tirzepatide 2.5 MG/0.5ML Pen Commonly known as: MOUNJARO Inject 2.5 mg into the skin once a week. Started by: Felix Pacinienee Becker Christopher, DO   tirzepatide 5 MG/0.5ML Pen Commonly known as: MOUNJARO Inject 5 mg into the skin once a week. Started by: Felix Pacinienee Quintavia Rogstad, DO        All past medical history, surgical history, allergies, family history, immunizations andmedications were updated in the EMR today and reviewed under the history and medication portions of their EMR.      ROS Negative, with the exception of above mentioned in HPI   Objective:  BP 132/74 (BP Location: Left Arm, Patient Position: Sitting, Cuff Size: Normal)    Pulse 67    Temp 98.2 F (36.8 C) (Temporal)    Ht 5\' 2"  (1.575 m)    Wt 186 lb 6.4 oz (84.6 kg)    SpO2 99%    BMI 34.09 kg/m  Body mass index is 34.09 kg/m.  Physical Exam Vitals and nursing note reviewed.  Constitutional:      General: She is not in acute distress.    Appearance: Normal appearance. She is not ill-appearing, toxic-appearing or diaphoretic.  HENT:     Head: Normocephalic and atraumatic.  Eyes:     General: No scleral icterus.       Right eye: No discharge.        Left eye: No discharge.     Extraocular Movements: Extraocular movements intact.     Conjunctiva/sclera: Conjunctivae normal.     Pupils: Pupils are equal, round, and reactive to light.  Skin:    General: Skin is warm and dry.     Coloration: Skin is not jaundiced or pale.     Findings: No erythema or rash.     Comments: > 30 close comedones along chin line.  Neurological:     Mental Status: She is alert and oriented to person, place, and time. Mental status is at baseline.     Motor: No weakness.     Gait: Gait normal.  Psychiatric:        Mood and Affect: Mood normal.        Behavior: Behavior normal.        Thought Content: Thought content normal.        Judgment: Judgment normal.    No results found. No results found. No results found for this or any previous visit (from the past 24 hour(s)).  Assessment/Plan: Barbara Lutz is a 26 y.o. female present for OV for  Acne vulgaris Start benzoyl peroxide wash before bed.  Then apply Epiduo gel. Continue cetaphil wash in the morning Recommend avoiding make-up in this area when wearing a mask if at all possible.  At least until condition is more stable.  Weight loss counseling, encounter for/Obesity (BMI 30-39.9) Patient was counseled on exercise, calorie counting, weight loss and  potential medications to help with weight loss today. -Patient was provided with online resources for: Weekly net calorie calculator.  Applications for calorie counting.  Patient was advised to ensure she is taking in adequate nutrition daily by meeting calorie goals. Patient was encouraged to avoid the 5 whites, for now -Start a 1000-calorie ADA diet for 10-14 days, then increase to 1200 cal a day. -Patient was educated on dietary changes to not only lose weight, but to eat healthy.   Patient was educated on glycemic index when choosing vegetables/fruits. -Patient was educated on exercise goal of 150 minutes a week (plus warm up and cool down) of cardiovascular exercise.   Patient was educated on heart rate for cardiovascular and fat burning zones. -Patient was encouraged to maintain adequate water consumption of at least 100 ounces a day, more if exercising/sweating. BMP and cardio IQ insulin resistance panel ordered. Mounjaro taper prescribed.  F/u 4 weeks.    Reviewed expectations re: course of current medical issues. Discussed self-management of symptoms. Outlined signs and symptoms indicating need for more acute intervention. Patient verbalized understanding and all questions were answered. Patient received an After-Visit Summary.    Orders Placed This Encounter  Procedures   Basic Metabolic Panel (BMET)   Cardio IQ Insulin Resistance Panel with Score   Cardio IQ Insulin Resistance Panel with Score   Basic Metabolic Panel (BMET)   Meds ordered this encounter  Medications   adapalene (DIFFERIN) 0.1 % gel    Sig: Apply topically at bedtime.    Dispense:  45 g    Refill:  5   Benzoyl Peroxide 10 % FOAM  Sig: Wash face nightly with foam    Dispense:  156 g    Refill:  3   tirzepatide (MOUNJARO) 2.5 MG/0.5ML Pen    Sig: Inject 2.5 mg into the skin once a week.    Dispense:  2 mL    Refill:  0    Prescription #1   tirzepatide (MOUNJARO) 5 MG/0.5ML Pen    Sig: Inject 5  mg into the skin once a week.    Dispense:  2 mL    Refill:  1    Prescription #2, may be refilled approximately 3 weeks after prescribe be and after prescription #1   Referral Orders  No referral(s) requested today     Note is dictated utilizing voice recognition software. Although note has been proof read prior to signing, occasional typographical errors still can be missed. If any questions arise, please do not hesitate to call for verification.   electronically signed by:  [name], DO  [facility] [place]

## 2021-07-05 ENCOUNTER — Other Ambulatory Visit (INDEPENDENT_AMBULATORY_CARE_PROVIDER_SITE_OTHER): Payer: No Typology Code available for payment source

## 2021-07-05 ENCOUNTER — Encounter: Payer: Self-pay | Admitting: Family Medicine

## 2021-07-05 ENCOUNTER — Telehealth: Payer: Self-pay

## 2021-07-05 ENCOUNTER — Other Ambulatory Visit (HOSPITAL_BASED_OUTPATIENT_CLINIC_OR_DEPARTMENT_OTHER): Payer: Self-pay

## 2021-07-05 DIAGNOSIS — E669 Obesity, unspecified: Secondary | ICD-10-CM | POA: Diagnosis not present

## 2021-07-05 MED ORDER — TIRZEPATIDE 5 MG/0.5ML ~~LOC~~ SOAJ
5.0000 mg | SUBCUTANEOUS | 1 refills | Status: DC
  Filled 2021-07-05: qty 2, 28d supply, fill #0

## 2021-07-05 MED ORDER — TIRZEPATIDE 2.5 MG/0.5ML ~~LOC~~ SOAJ
2.5000 mg | SUBCUTANEOUS | 0 refills | Status: DC
  Filled 2021-07-05: qty 2, 28d supply, fill #0

## 2021-07-05 NOTE — Telephone Encounter (Signed)
PA sent via covermymed on 07/05/21   Key: [id number]   Medication: Mounjaro 2.5MG /0.5ML pen-injectors   Dx: E66.9(obesity) and Z71.3(wt loss counseling)   Per Dr. Raoul Pitch pt has tried and failed N/.A   Waiting for response.

## 2021-07-06 ENCOUNTER — Other Ambulatory Visit (HOSPITAL_BASED_OUTPATIENT_CLINIC_OR_DEPARTMENT_OTHER): Payer: Self-pay

## 2021-07-06 ENCOUNTER — Ambulatory Visit: Payer: 59 | Admitting: Family Medicine

## 2021-07-06 LAB — BASIC METABOLIC PANEL
BUN: 7 mg/dL (ref 7–25)
CO2: 29 mmol/L (ref 20–32)
Calcium: 9.2 mg/dL (ref 8.6–10.2)
Chloride: 106 mmol/L (ref 98–110)
Creat: 0.92 mg/dL (ref 0.50–0.96)
Glucose, Bld: 79 mg/dL (ref 65–99)
Potassium: 4.3 mmol/L (ref 3.5–5.3)
Sodium: 140 mmol/L (ref 135–146)

## 2021-07-09 ENCOUNTER — Other Ambulatory Visit (HOSPITAL_BASED_OUTPATIENT_CLINIC_OR_DEPARTMENT_OTHER): Payer: Self-pay

## 2021-07-10 ENCOUNTER — Other Ambulatory Visit (HOSPITAL_BASED_OUTPATIENT_CLINIC_OR_DEPARTMENT_OTHER): Payer: Self-pay

## 2021-07-10 LAB — CARDIO IQ INSULIN RESISTANCE PANEL WITH SCORE
C-PEPTIDE, LC/MS/MS: 0.83 ng/mL (ref 0.68–2.16)
INSULIN, INTACT, LC/MS/MS: <3 u[IU]/mL (ref <=16)
Insulin Resistance Score: 4 (ref <=66)

## 2021-07-11 ENCOUNTER — Encounter: Payer: Self-pay | Admitting: Family Medicine

## 2021-07-11 ENCOUNTER — Other Ambulatory Visit (HOSPITAL_BASED_OUTPATIENT_CLINIC_OR_DEPARTMENT_OTHER): Payer: Self-pay

## 2021-07-11 MED ORDER — OZEMPIC (0.25 OR 0.5 MG/DOSE) 2 MG/1.5ML ~~LOC~~ SOPN
PEN_INJECTOR | SUBCUTANEOUS | 2 refills | Status: DC
  Filled 2021-07-11: qty 1.5, 42d supply, fill #0

## 2021-07-11 NOTE — Telephone Encounter (Signed)
PA submitted on covermymeds  [facility] (Key: [id number]) Rx #: [id number] Ozempic (0.25 or 0.5 MG/DOSE) 2MG /1.5ML pen-injectors

## 2021-07-11 NOTE — Telephone Encounter (Signed)
Deniedon January 23 This request has not been approved. Based on the information submitted for review, you did not meet our guideline rules for the requested drug. In order for your request to be approved, your provider would need to show that you have met the guideline rules below. The details below are written in medical language. If you have questions, please contact your provider. In some cases, the requested medication or alternatives offered may have additional approval requirements. Our guideline named GLP-1 STEP OVERRIDE (reviewed for [facility]) requires the following rules to be met for approval:1. You have a diagnosis of type 2 diabetes mellitus (a chronic condition in which your body is resistant to insulin, a hormone that regulates your blood sugar). 2. You have tried taking metformin, a metformin combination product, a covered sulfonylurea (such as glimepiride, glipizide, or glyburide), a sulfonylurea combination product, pioglitazone OR a pioglitazone combination product, unless there is a medical reason why you cannot (contraindication to).Your doctor requested this medication for weight loss or weight management.This request was denied because we did not receive information that you meet the requirements listed above. Please work with your doctor to use a different medication or get [place] more information if it will allow [place] to approve this request. A written notification letter will follow with additional details.

## 2021-07-11 NOTE — Telephone Encounter (Signed)
Please advise 

## 2021-07-11 NOTE — Telephone Encounter (Signed)
Dc Mounjaro- tried to call in ozempic in its place.  Please let patient know and that ozmepic  and wegovy are the same medication (dosage different)

## 2021-07-12 ENCOUNTER — Other Ambulatory Visit (HOSPITAL_BASED_OUTPATIENT_CLINIC_OR_DEPARTMENT_OTHER): Payer: Self-pay

## 2021-07-12 MED ORDER — WEGOVY 0.25 MG/0.5ML ~~LOC~~ SOAJ
0.2500 mg | SUBCUTANEOUS | 1 refills | Status: DC
  Filled 2021-07-12: qty 2, 28d supply, fill #0

## 2021-07-12 NOTE — Addendum Note (Signed)
Addended by: Howard Pouch A on: 07/12/2021 02:47 PM   Modules accepted: Orders

## 2021-07-12 NOTE — Telephone Encounter (Signed)
I called in the generic version "Ozempic "which is semaglutide.  Semaglutide is the generic version of Wegovy as well.  I am not certain why this is being treated differently on insurance or with her pharmacy. I entered it in a different way this time, hopefully it will be approved.

## 2021-07-12 NOTE — Telephone Encounter (Signed)
Pt called to clarify MyChart msg. She said insurance called her and denied Ozempic since she is not diabetic. They will cover Wegovy (same med but not for diabetic pt) or they will cover Saxenda.

## 2021-07-12 NOTE — Telephone Encounter (Signed)
PA still pending. FYI.

## 2021-07-13 ENCOUNTER — Other Ambulatory Visit (HOSPITAL_BASED_OUTPATIENT_CLINIC_OR_DEPARTMENT_OTHER): Payer: Self-pay

## 2021-07-16 ENCOUNTER — Other Ambulatory Visit (HOSPITAL_BASED_OUTPATIENT_CLINIC_OR_DEPARTMENT_OTHER): Payer: Self-pay

## 2021-07-16 NOTE — Telephone Encounter (Signed)
I would have her call her pharmacy. If the reps are telling us it is approved, then I am not understanding why it is denied once to the pharmacy. What is the criteria she is not meeting for it to be approved?

## 2021-07-16 NOTE — Telephone Encounter (Signed)
Pt requesting to check status of prior auth for [facility]. Ok to send via Mychart or call [phone].

## 2021-07-17 ENCOUNTER — Other Ambulatory Visit (HOSPITAL_BASED_OUTPATIENT_CLINIC_OR_DEPARTMENT_OTHER): Payer: Self-pay

## 2021-07-17 MED ORDER — WEGOVY 0.25 MG/0.5ML ~~LOC~~ SOAJ
0.2500 mg | SUBCUTANEOUS | 1 refills | Status: DC
  Filled 2021-07-17 – 2021-07-24 (×2): qty 2, 28d supply, fill #0
  Filled 2021-08-15 – 2021-08-17 (×2): qty 2, 28d supply, fill #1

## 2021-07-17 NOTE — Telephone Encounter (Signed)
We need to make contact with the pharmacy.  A prescription for [facility] name brand cannot be created in epic.  Even when choosing we will go the name brand, when the prescription is ready to be signed it still includes the generic name brand as well as Wegovy.  It even states for weight management on the prescription.  I resent it again today and put in the pharmacy notes to dispense [facility] name brand.

## 2021-07-17 NOTE — Telephone Encounter (Signed)
Pt states that [facility] requires RX for Brand Name [facility] not generic. Generic reflects as needing to be diabetic, which is why they denied.

## 2021-07-17 NOTE — Telephone Encounter (Signed)
Please see message below

## 2021-07-17 NOTE — Addendum Note (Signed)
Addended by: Howard Pouch A on: 07/17/2021 12:37 PM   Modules accepted: Orders

## 2021-07-17 NOTE — Telephone Encounter (Signed)
Called pharmacy who stated that PA is required. Awaiting on PA form.

## 2021-07-18 ENCOUNTER — Ambulatory Visit (INDEPENDENT_AMBULATORY_CARE_PROVIDER_SITE_OTHER): Payer: No Typology Code available for payment source | Admitting: Family Medicine

## 2021-07-18 ENCOUNTER — Encounter: Payer: Self-pay | Admitting: Family Medicine

## 2021-07-18 ENCOUNTER — Other Ambulatory Visit (HOSPITAL_BASED_OUTPATIENT_CLINIC_OR_DEPARTMENT_OTHER): Payer: Self-pay

## 2021-07-18 VITALS — BP 109/60 | HR 85 | Temp 98.2°F | Ht 62.0 in | Wt 181.0 lb

## 2021-07-18 DIAGNOSIS — J039 Acute tonsillitis, unspecified: Secondary | ICD-10-CM | POA: Diagnosis not present

## 2021-07-18 LAB — POCT INFLUENZA A/B
Influenza A, POC: NEGATIVE
Influenza B, POC: NEGATIVE

## 2021-07-18 MED ORDER — PENICILLIN G BENZATHINE 2400000 UNIT/4ML IM SUSP
1.2000 10*6.[IU] | Freq: Once | INTRAMUSCULAR | Status: AC
  Administered 2021-07-18: 1200000 [IU] via INTRAMUSCULAR

## 2021-07-18 NOTE — Progress Notes (Addendum)
Acute Office Visit  Subjective:    Patient ID: Barbara Lutz, female    DOB: 04-20-1996, 26 y.o.   MRN: [id number]  Chief Complaint  Patient presents with   Sore Throat    HPI Patient is in today for sore throat, swollen tonsils.   Patient states she had a recent URI and was all symptoms had improved. This morning around 3am she woke up with a horribly sore throat and took some tylenol so she could go back to sleep. When she got up later it was painful and lymph nodes felt swollen. States her tonsils are swollen and have white spots on them. Reports history of tonsillitis a few times per year. Often she will test negative for strep, but still respond to PCN. She is considering asking her PCP for an ENT referral since this is happening so often. Reports this is one of the more extreme episodes. She denies any fevers, dyspnea, wheezing, nausea, vomiting, headaches.     Past Medical History:  Diagnosis Date   Anxiety    COVID-19    Depression    Depression, major, recurrent, moderate (HCC) 05/25/2012   History of pyelonephritis 07/23/2018   Mucocele of lip 01/2020   Obesity (BMI 30-39.9) 07/23/2018   [facility]   Pityriasis versicolor 03/19/2021   Recurrent UTI 07/23/2018   Thyroid disorder     Past Surgical History:  Procedure Laterality Date   ORAL MUCOCELE EXCISION      Family History  Problem Relation Age of Onset   COPD Mother    Hypertension Mother    Heart attack Father    High Cholesterol Father    Depression Sister    Diabetes Maternal Grandfather    Hearing loss Maternal Grandfather    Heart attack Maternal Grandfather    Heart disease Maternal Grandfather    Hypertension Maternal Grandfather    Hyperlipidemia Maternal Grandfather    ADD / ADHD Paternal Grandfather    Alcohol abuse Paternal Grandfather    COPD Paternal Grandfather    Diabetes Paternal Grandfather    Hearing loss Paternal Grandfather    Hyperlipidemia Paternal Grandfather     Hypertension Paternal Grandfather    Stroke Paternal Grandfather    Asthma Sister    Depression Sister    Drug abuse Sister    Breast cancer Maternal Grandmother    Pancreatic cancer Paternal Grandmother    Arthritis Paternal Grandmother    Hypertension Paternal Grandmother     Social History   Socioeconomic History   Marital status: Single    Spouse name: Not on file   Number of children: Not on file   Years of education: Not on file   Highest education level: Not on file  Occupational History   Not on file  Tobacco Use   Smoking status: Some Days    Packs/day: 0.00    Types: Cigarettes   Smokeless tobacco: Never  Vaping Use   Vaping Use: Every day  Substance and Sexual Activity   Alcohol use: Yes    Comment: social   Drug use: No   Sexual activity: Not on file  Other Topics Concern   Not on file  Social History Narrative   Marital status/children/pets: Single.   Education/employment: GED.  Employed. Manager.   Safety:      -smoke alarm in the home:Yes     - wears seatbelt: Yes     - Feels safe in their relationships: Yes   Social Determinants of Health  Financial Resource Strain: Not on file  Food Insecurity: Not on file  Transportation Needs: Not on file  Physical Activity: Not on file  Stress: Not on file  Social Connections: Not on file  Intimate Partner Violence: Not on file    Outpatient Medications Prior to Visit  Medication Sig Dispense Refill   adapalene (DIFFERIN) 0.1 % gel Apply topically at bedtime. 45 g 5   Benzoyl Peroxide 10 % FOAM Wash face nightly with foam 156 g 3   citalopram (CELEXA) 20 MG tablet Take 1 tablet (20 mg total) by mouth daily. 90 tablet 1   clindamycin (CLEOCIN T) 1 % external solution Apply on to the skin 2 (two) times daily. 60 mL 5   Semaglutide-Weight Management (WEGOVY) 0.25 MG/0.5ML SOAJ Inject 0.25 mg into the skin once a week for 28 days. (Patient not taking: Reported on 07/18/2021) 2 mL 1   No  facility-administered medications prior to visit.    Allergies  Allergen Reactions   Latex Rash   Nickel Dermatitis    Review of Systems All review of systems negative except what is listed in the HPI     Objective:    Physical Exam Vitals reviewed.  Constitutional:      Appearance: Normal appearance.  HENT:     Head: Normocephalic and atraumatic.     Mouth/Throat:     Mouth: Mucous membranes are moist.     Pharynx: Uvula midline. Pharyngeal swelling and posterior oropharyngeal erythema present.     Tonsils: Tonsillar exudate present. 2+ on the right. 2+ on the left.  Musculoskeletal:     Cervical back: Tenderness present.  Lymphadenopathy:     Cervical: Cervical adenopathy present.  Skin:    General: Skin is warm and dry.     Capillary Refill: Capillary refill takes less than 2 seconds.  Neurological:     General: No focal deficit present.     Mental Status: She is alert and oriented to person, place, and time. Mental status is at baseline.  Psychiatric:        Mood and Affect: Mood normal.        Behavior: Behavior normal.        Thought Content: Thought content normal.        Judgment: Judgment normal.    BP 109/60    Pulse 85    Temp 98.2 F (36.8 C)    Ht 5\' 2"  (1.575 m)    Wt 181 lb (82.1 kg)    SpO2 100%    BMI 33.11 kg/m  Wt Readings from Last 3 Encounters:  07/18/21 181 lb (82.1 kg)  07/04/21 186 lb 6.4 oz (84.6 kg)  05/03/21 178 lb 3.2 oz (80.8 kg)    There are no preventive care reminders to display for this patient.  There are no preventive care reminders to display for this patient.   Lab Results  Component Value Date   TSH 1.09 03/19/2021   Lab Results  Component Value Date   WBC 10.0 09/21/2020   HGB 13.5 09/21/2020   HCT 38.9 09/21/2020   MCV 91.5 09/21/2020   PLT 271 09/21/2020   Lab Results  Component Value Date   NA 140 07/05/2021   K 4.3 07/05/2021   CO2 29 07/05/2021   GLUCOSE 79 07/05/2021   BUN 7 07/05/2021   CREATININE  0.92 07/05/2021   BILITOT 0.5 09/21/2020   ALKPHOS 48 09/21/2020   AST 20 09/21/2020   ALT 15 09/21/2020  PROT 6.9 09/21/2020   ALBUMIN 4.2 09/21/2020   CALCIUM 9.2 07/05/2021   ANIONGAP 8 09/21/2020   GFR 76.84 03/17/2020   Lab Results  Component Value Date   CHOL 143 03/19/2021   Lab Results  Component Value Date   HDL 81.10 03/19/2021   Lab Results  Component Value Date   LDLCALC 50 03/19/2021   Lab Results  Component Value Date   TRIG 57.0 03/19/2021   Lab Results  Component Value Date   CHOLHDL 2 03/19/2021   Lab Results  Component Value Date   HGBA1C 5.2 03/19/2021       Assessment & Plan:    1. Tonsillitis with exudate Flu negative. No strep tests available today. Given severity of symptoms and presentation, will treat empirically with PCN. She prefers injection in office today. Continue supportive measures and follow-up if not improving. Patient aware of signs/symptoms requiring further/urgent evaluation.    - Penicillin G Benzathine SUSP 1,200,000 Units      Clayborne Danaaylor B Corrinna Karapetyan, NP

## 2021-07-18 NOTE — Patient Instructions (Addendum)
Bicillin L-A 1.2 million/30mL injection given today Continue supportive measures including rest, hydration, humidifier use, steam showers, warm compresses to sinuses, warm liquids with lemon and honey, and over-the-counter cough, cold, and analgesics as needed.    Please contact office for follow-up if symptoms do not improve or worsen. Seek emergency care if symptoms become severe.

## 2021-07-18 NOTE — Telephone Encounter (Signed)
Message from [employer] previously denied or partially denied PA request has been located for this member. To initiate an appeal or reconsideration please call [phone]. Thank you.

## 2021-07-18 NOTE — Addendum Note (Signed)
Addended byDamita Dunnings D on: 07/18/2021 12:46 PM   Modules accepted: Orders

## 2021-07-19 ENCOUNTER — Other Ambulatory Visit (HOSPITAL_BASED_OUTPATIENT_CLINIC_OR_DEPARTMENT_OTHER): Payer: Self-pay

## 2021-07-19 NOTE — Telephone Encounter (Signed)
Called insurance to start PA by phone. Pt will receive response 72 business hours. No PA given at this time per insurance.

## 2021-07-20 ENCOUNTER — Telehealth: Payer: Self-pay | Admitting: *Deleted

## 2021-07-20 ENCOUNTER — Other Ambulatory Visit (HOSPITAL_BASED_OUTPATIENT_CLINIC_OR_DEPARTMENT_OTHER): Payer: Self-pay

## 2021-07-20 MED ORDER — FLUCONAZOLE 150 MG PO TABS
150.0000 mg | ORAL_TABLET | Freq: Once | ORAL | 0 refills | Status: AC
  Filled 2021-07-20: qty 2, 2d supply, fill #0

## 2021-07-20 NOTE — Telephone Encounter (Signed)
Called number provided which and spoke with insurance who stated that the generic brand was most likely entered during telephone encounter.   PA resubmitted via telephone for brand [facility]. No PA reference number provided per ins and will be able to check status by number provided in previous message.

## 2021-07-20 NOTE — Telephone Encounter (Signed)
Fluconazole sent to pharm.

## 2021-07-20 NOTE — Telephone Encounter (Signed)
Received fax for PA completed yesterday from covermymeds. Which stated the following. A previously denied or partially denied PA request has been located for this member. To initiate reconsideration please call [phone]. Thank you.

## 2021-07-23 ENCOUNTER — Telehealth: Payer: Self-pay

## 2021-07-23 ENCOUNTER — Other Ambulatory Visit (HOSPITAL_BASED_OUTPATIENT_CLINIC_OR_DEPARTMENT_OTHER): Payer: Self-pay

## 2021-07-23 NOTE — Telephone Encounter (Signed)
Called and verified that PA was still pending. Reference number [id number]

## 2021-07-23 NOTE — Telephone Encounter (Signed)
Left message on voicemail requesting a call back. PA is still pending; need to get updated DPR for [facility]

## 2021-07-24 ENCOUNTER — Other Ambulatory Visit (HOSPITAL_BASED_OUTPATIENT_CLINIC_OR_DEPARTMENT_OTHER): Payer: Self-pay

## 2021-07-24 NOTE — Telephone Encounter (Signed)
Faxed received from [employer] 07/24/21 Re: Barbara Flock  PA reference number: [id number] Fax number [phone]

## 2021-07-24 NOTE — Telephone Encounter (Signed)
Form faxed and sent to scan

## 2021-07-24 NOTE — Telephone Encounter (Signed)
This nurse spoke with the patient to give her an update on the PA for [facility]. The PA is still in pending status as of 07/23/21. This nurse assured patient that we will update once more information has been provided. Also made patient aware that her DPR needs to be updated. Patient states she will work on getting this completed.

## 2021-07-25 ENCOUNTER — Other Ambulatory Visit (HOSPITAL_BASED_OUTPATIENT_CLINIC_OR_DEPARTMENT_OTHER): Payer: Self-pay

## 2021-07-26 ENCOUNTER — Other Ambulatory Visit (HOSPITAL_BASED_OUTPATIENT_CLINIC_OR_DEPARTMENT_OTHER): Payer: Self-pay

## 2021-07-27 NOTE — Telephone Encounter (Signed)
This nurse called to confirm that patient had received notification from pharmacy regarding approval of the authorization for [facility]. Patient confirmed receipt of notification.

## 2021-08-03 ENCOUNTER — Ambulatory Visit: Payer: No Typology Code available for payment source | Admitting: Family Medicine

## 2021-08-06 ENCOUNTER — Other Ambulatory Visit (HOSPITAL_BASED_OUTPATIENT_CLINIC_OR_DEPARTMENT_OTHER): Payer: Self-pay

## 2021-08-08 ENCOUNTER — Encounter: Payer: Self-pay | Admitting: Family Medicine

## 2021-08-09 NOTE — Telephone Encounter (Signed)
Please advise when pt is to taper.

## 2021-08-10 NOTE — Telephone Encounter (Signed)
Stay on current dose at this time.  May eventually taper, many patients stay on low-dose for weight loss.  Will discuss at appointment if tapering would be appropriate for her.  She has a refill on her current dose.

## 2021-08-15 ENCOUNTER — Other Ambulatory Visit (HOSPITAL_BASED_OUTPATIENT_CLINIC_OR_DEPARTMENT_OTHER): Payer: Self-pay

## 2021-08-17 ENCOUNTER — Other Ambulatory Visit (HOSPITAL_BASED_OUTPATIENT_CLINIC_OR_DEPARTMENT_OTHER): Payer: Self-pay

## 2021-08-24 ENCOUNTER — Telehealth: Payer: Self-pay | Admitting: Family Medicine

## 2021-08-24 ENCOUNTER — Ambulatory Visit: Payer: No Typology Code available for payment source | Admitting: Family Medicine

## 2021-08-27 ENCOUNTER — Telehealth: Payer: Self-pay | Admitting: Family Medicine

## 2021-08-27 NOTE — Telephone Encounter (Signed)
Ok with me 

## 2021-08-27 NOTE — Telephone Encounter (Signed)
Pt is wanting to [facility] from [facility] to [facility]  due to flexibility from work area - pt works at [employer] at [employer] at [employer] and it is easier for her to be seen in this office. If ok with provider to be transferred.  Please advise.  ?

## 2021-09-06 ENCOUNTER — Encounter: Payer: Self-pay | Admitting: Family Medicine

## 2021-09-06 ENCOUNTER — Other Ambulatory Visit (HOSPITAL_BASED_OUTPATIENT_CLINIC_OR_DEPARTMENT_OTHER): Payer: Self-pay

## 2021-09-06 ENCOUNTER — Ambulatory Visit (INDEPENDENT_AMBULATORY_CARE_PROVIDER_SITE_OTHER): Payer: No Typology Code available for payment source | Admitting: Family Medicine

## 2021-09-06 VITALS — BP 127/63 | HR 64 | Ht 62.0 in | Wt 172.8 lb

## 2021-09-06 DIAGNOSIS — E669 Obesity, unspecified: Secondary | ICD-10-CM | POA: Diagnosis not present

## 2021-09-06 DIAGNOSIS — F411 Generalized anxiety disorder: Secondary | ICD-10-CM

## 2021-09-06 MED ORDER — WEGOVY 0.5 MG/0.5ML ~~LOC~~ SOAJ
0.5000 mg | SUBCUTANEOUS | 2 refills | Status: DC
Start: 2021-09-06 — End: 2021-10-31
  Filled 2021-09-06: qty 2, 28d supply, fill #0
  Filled 2021-10-02: qty 2, 28d supply, fill #1

## 2021-09-06 MED ORDER — ESCITALOPRAM OXALATE 10 MG PO TABS
10.0000 mg | ORAL_TABLET | Freq: Every day | ORAL | 3 refills | Status: DC
  Filled 2021-09-06: qty 30, 30d supply, fill #0
  Filled 2021-10-08: qty 30, 30d supply, fill #1

## 2021-09-06 NOTE — Patient Instructions (Signed)
Drop down to Celexa 10 mg for the next week, then start the Lexapro. Starting at lowest dose to see how you tolerate, can increase in 4-6 weeks if needed.  ? ?Taking the medicine as directed and not missing any doses is one of the best things you can do to treat your anxiety/depression.  Here are some things to keep in mind: ?Side effects (stomach upset, some increased anxiety) may happen before you notice a benefit.  These side effects typically go away over time. ?Changes to your dose of medicine or a change in medication all together is sometimes necessary ?Many people will notice an improvement within two weeks but the full effect of the medication can take up to 4-6 weeks ?Stopping the medication when you start feeling better often results in a return of symptoms. Most people need to be on medication at least 6-12 months ?If you start having thoughts of hurting yourself or others after starting this medicine, please call me immediately.   ? ?

## 2021-09-06 NOTE — Progress Notes (Signed)
? ?______________________________________________________________________ ? ?HPI ?Barbara Lutz is a 26 y.o. female presenting to [facility] at [facility] today to establish care.  ? ?Patient Care Team: ?Clayborne Dana, NP as PCP - General (Family Medicine) ? ?Health Maintenance  ?Topic Date Due  ? Pap Smear  06/03/2023  ? Pap Smear  06/03/2023  ? Tetanus Vaccine  03/19/2027  ? Flu Shot  Completed  ? HPV Vaccine  Completed  ? Hepatitis C Screening: USPSTF Recommendation to screen - Ages 69-79 yo.  Completed  ? HIV Screening  Completed  ? COVID-19 Vaccine  Discontinued  ? ? ? ?Concerns today: ?Weight loss - Kendria has been on Wegovy 0.25 mg for the past 7+ weeks (shot every Monday). Reports she had drastic drop in weight initially, but then has leveled off for the past 3 weeks.  Reports overall she has been feeling good, appetite has been suppressed, good portion control, no side effects. She is getting at least 30 minutes of exercise daily.  ?Anxiety - She has been on Celexa 20 mg daily daily for the past 5-6 months. Feels like it is working well, however she is having some sexual dysfunction since starting. States it is not a huge problem, but would like to try switching to Lexapro to see if this is a little better. No other side effects. No SI/HI. ? ? ?Patient Active Problem List  ? Diagnosis Date Noted  ? GAD (generalized anxiety disorder) 03/19/2021  ? History of pyelonephritis 07/23/2018  ? Nicotine dependence, cigarettes, uncomplicated 07/23/2018  ? Obesity (BMI 30-39.9) 07/23/2018  ? ? ?PHQ9 Today: ? ?  09/06/2021  ?  4:45 PM 07/04/2021  ?  3:57 PM 03/19/2021  ?  8:09 AM  ?Depression screen PHQ 2/9  ?Decreased Interest 1 0 1  ?Down, Depressed, Hopeless 1 1 1   ?PHQ - 2 Score 2 1 2   ?Altered sleeping 2  1  ?Tired, decreased energy 1  1  ?Change in appetite 0  2  ?Feeling bad or failure about yourself    1  ?Trouble concentrating 0  0  ?Moving slowly or fidgety/restless 0  0  ?Suicidal  thoughts 0  0  ?PHQ-9 Score 5  7  ?Difficult doing work/chores Somewhat difficult    ? ?GAD7 Today: ? ?  09/06/2021  ?  4:45 PM 03/19/2021  ?  8:09 AM 03/17/2020  ?  8:42 AM  ?GAD 7 : Generalized Anxiety Score  ?Nervous, Anxious, on Edge 2 3 3   ?Control/stop worrying 1 3 3   ?Worry too much - different things 1 3 3   ?Trouble relaxing 1 2 2   ?Restless 1 0 0  ?Easily annoyed or irritable 1 1 1   ?Afraid - awful might happen 0 0 0  ?Total GAD 7 Score 7 12 12   ?Anxiety Difficulty Somewhat difficult    ? ?______________________________________________________________________ ?PMH ?Past Medical History:  ?Diagnosis Date  ? Anxiety   ? COVID-19   ? Depression   ? Depression, major, recurrent, moderate (HCC) 05/25/2012  ? History of pyelonephritis 07/23/2018  ? Mucocele of lip 01/2020  ? Obesity (BMI 30-39.9) 07/23/2018  ? [facility]  ? Pityriasis versicolor 03/19/2021  ? Recurrent UTI 07/23/2018  ? Thyroid disorder   ? ? ?ROS ?All review of systems negative except what is listed in the HPI ? ?PHYSICAL EXAM ?Physical Exam ?Vitals reviewed.  ?Constitutional:   ?   Appearance: Normal appearance.  ?Cardiovascular:  ?   Rate and Rhythm: Normal rate  and regular rhythm.  ?Pulmonary:  ?   Effort: Pulmonary effort is normal.  ?   Breath sounds: Normal breath sounds.  ?Skin: ?   General: Skin is warm and dry.  ?   Capillary Refill: Capillary refill takes less than 2 seconds.  ?Neurological:  ?   General: No focal deficit present.  ?   Mental Status: She is alert and oriented to person, place, and time. Mental status is at baseline.  ?Psychiatric:     ?   Mood and Affect: Mood normal.     ?   Behavior: Behavior normal.     ?   Thought Content: Thought content normal.     ?   Judgment: Judgment normal.  ? ?______________________________________________________________________ ?ASSESSMENT AND PLAN ? ?1. Obesity (BMI 30-39.9) ?Good initial response to [facility], but has now hit weight loss plateau. Will increase to 0.5 mg weekly for  at least the next 4 weeks. Continue healthy diet, portion control, and regular exercise.  ?- Semaglutide-Weight Management (WEGOVY) 0.5 MG/0.5ML SOAJ; Inject 0.5 mg into the skin once a week.  Dispense: 2 mL; Refill: 2 ? ?2. GAD (generalized anxiety disorder) ?She is doing well. No SI/HI. Would like to try switching to Lexapro to see if she has less sexual side effects. Recommended tapering Celexa down to 10 mg (1/2 tablet) for the next week and then starting the Lexapro afterwards. Education provided. Reassess in 4-6 weeks, sooner if needed.  ?- escitalopram (LEXAPRO) 10 MG tablet; Take 1 tablet (10 mg total) by mouth daily.  Dispense: 30 tablet; Refill: 3 ? ?Establish care ?Education provided today during visit and on AVS for patient to review at home.  ?Diet and Exercise recommendations provided.  ?Current diagnoses and recommendations discussed. ?HM recommendations reviewed with recommendations.  ? ? ?Outpatient Encounter Medications as of 09/06/2021  ?Medication Sig  ? escitalopram (LEXAPRO) 10 MG tablet Take 1 tablet (10 mg total) by mouth daily.  ? Semaglutide-Weight Management (WEGOVY) 0.5 MG/0.5ML SOAJ Inject 0.5 mg into the skin once a week.  ? [DISCONTINUED] adapalene (DIFFERIN) 0.1 % gel Apply topically at bedtime.  ? [DISCONTINUED] Benzoyl Peroxide 10 % FOAM Wash face nightly with foam  ? [DISCONTINUED] citalopram (CELEXA) 20 MG tablet Take 1 tablet (20 mg total) by mouth daily.  ? [DISCONTINUED] clindamycin (CLEOCIN T) 1 % external solution Apply on to the skin 2 (two) times daily.  ? [DISCONTINUED] Semaglutide-Weight Management (WEGOVY) 0.25 MG/0.5ML SOAJ Inject 0.25 mg into the skin once a week for 28 days. (Patient not taking: Reported on 07/18/2021)  ? ?No facility-administered encounter medications on file as of 09/06/2021.  ? ? ?Return for 4-6 week mood follow-up, physical at your convenience . ? ? ? ?Lollie Marrow Reola Calkins, DNP, FNP-C ? ? ?

## 2021-09-17 ENCOUNTER — Other Ambulatory Visit (INDEPENDENT_AMBULATORY_CARE_PROVIDER_SITE_OTHER): Payer: No Typology Code available for payment source

## 2021-09-17 ENCOUNTER — Other Ambulatory Visit: Payer: Self-pay

## 2021-09-17 DIAGNOSIS — R8290 Unspecified abnormal findings in urine: Secondary | ICD-10-CM | POA: Diagnosis not present

## 2021-09-17 LAB — URINALYSIS
Hgb urine dipstick: NEGATIVE
Leukocytes,Ua: NEGATIVE
Nitrite: NEGATIVE
Specific Gravity, Urine: 1.015 (ref 1.000–1.030)
Urine Glucose: NEGATIVE
Urobilinogen, UA: 0.2 (ref 0.0–1.0)
pH: 7 (ref 5.0–8.0)

## 2021-09-18 ENCOUNTER — Encounter: Payer: Self-pay | Admitting: Family Medicine

## 2021-09-18 LAB — URINE CULTURE
MICRO NUMBER:: 13213984
SPECIMEN QUALITY:: ADEQUATE

## 2021-09-20 ENCOUNTER — Encounter: Payer: No Typology Code available for payment source | Admitting: Family Medicine

## 2021-09-24 NOTE — Telephone Encounter (Signed)
signed

## 2021-09-26 ENCOUNTER — Ambulatory Visit: Payer: No Typology Code available for payment source | Admitting: Family Medicine

## 2021-09-26 ENCOUNTER — Encounter: Payer: 59 | Admitting: Family Medicine

## 2021-10-02 ENCOUNTER — Other Ambulatory Visit (HOSPITAL_BASED_OUTPATIENT_CLINIC_OR_DEPARTMENT_OTHER): Payer: Self-pay

## 2021-10-08 ENCOUNTER — Encounter: Payer: Self-pay | Admitting: Family Medicine

## 2021-10-08 ENCOUNTER — Other Ambulatory Visit (HOSPITAL_BASED_OUTPATIENT_CLINIC_OR_DEPARTMENT_OTHER): Payer: Self-pay

## 2021-10-08 DIAGNOSIS — F411 Generalized anxiety disorder: Secondary | ICD-10-CM

## 2021-10-09 ENCOUNTER — Other Ambulatory Visit (HOSPITAL_BASED_OUTPATIENT_CLINIC_OR_DEPARTMENT_OTHER): Payer: Self-pay

## 2021-10-09 MED ORDER — CITALOPRAM HYDROBROMIDE 20 MG PO TABS
20.0000 mg | ORAL_TABLET | Freq: Every day | ORAL | 3 refills | Status: DC
  Filled 2021-10-09 – 2021-10-15 (×3): qty 90, 90d supply, fill #0

## 2021-10-11 ENCOUNTER — Telehealth: Payer: No Typology Code available for payment source | Admitting: Physician Assistant

## 2021-10-11 DIAGNOSIS — A084 Viral intestinal infection, unspecified: Secondary | ICD-10-CM | POA: Diagnosis not present

## 2021-10-11 MED ORDER — ONDANSETRON 8 MG PO TBDP: 8.0000 mg | ORAL_TABLET | Freq: Three times a day (TID) | ORAL | 0 refills | Status: DC | PRN

## 2021-10-11 NOTE — Progress Notes (Signed)
?Virtual Visit Consent  ? ?[facility], you are scheduled for a virtual visit with a [facility] provider today.   ?  ?Just as with appointments in the office, your consent must be obtained to participate.  Your consent will be active for this visit and any virtual visit you may have with one of our providers in the next 365 days.   ?  ?If you have a MyChart account, a copy of this consent can be sent to you electronically.  All virtual visits are billed to your insurance company just like a traditional visit in the office.   ? ?As this is a virtual visit, video technology does not allow for your provider to perform a traditional examination.  This may limit your provider's ability to fully assess your condition.  If your provider identifies any concerns that need to be evaluated in person or the need to arrange testing (such as labs, EKG, etc.), we will make arrangements to do so.   ?  ?Although advances in technology are sophisticated, we cannot ensure that it will always work on either your end or our end.  If the connection with a video visit is poor, the visit may have to be switched to a telephone visit.  With either a video or telephone visit, we are not always able to ensure that we have a secure connection.    ? ?Also, by engaging in this virtual visit, you consent to the provision of healthcare. Additionally, you authorize for your insurance to be billed (if applicable) for the services provided during this visit.  ? ?I need to obtain your verbal consent now.   Are you willing to proceed with your visit today?  ?  ?Barbara Lutz has provided verbal consent on 10/11/2021 for a virtual visit (video or telephone). ?  ?Margaretann Loveless, PA-C  ? ?Date: 10/11/2021 6:15 PM ? ? ?Virtual Visit via Video Note  ? ?Barbara Lutz, connected with  Barbara Lutz  ([id number], February 02, 1996) on 10/11/21 at  5:45 PM EDT by a video-enabled telemedicine application and verified that I am speaking with the correct  person using two identifiers. ? ?Location: ?Patient: Virtual Visit Location Patient: Home ?Provider: Virtual Visit Location Provider: Home Office ?  ?I discussed the limitations of evaluation and management by telemedicine and the availability of in person appointments. The patient expressed understanding and agreed to proceed.   ? ?History of Present Illness: ?Barbara Lutz is a 26 y.o. who identifies as a female who was assigned female at birth, and is being seen today for nausea. ? ?HPI: GI Problem ?The primary symptoms include fatigue, abdominal pain, vomiting, diarrhea, myalgias and arthralgias. Primary symptoms do not include fever (unknown), nausea, melena, hematemesis or hematochezia. The illness began today. The onset was sudden. The problem has been gradually worsening.  ?The illness is also significant for chills (severe chills; having to stay in hot water. Going between her house and her parents house when hot water runs out because she cannot stand not being in the hot water) and anorexia. The illness does not include constipation. Associated medical issues do not include inflammatory bowel disease or GERD.   ?Works at [employer] office so could have had sick exposure. ? ?Problems:  ?Patient Active Problem List  ? Diagnosis Date Noted  ? GAD (generalized anxiety disorder) 03/19/2021  ? History of pyelonephritis 07/23/2018  ? Nicotine dependence, cigarettes, uncomplicated 07/23/2018  ? Obesity (BMI 30-39.9) 07/23/2018  ?  ?Allergies:  ?Allergies  ?  Allergen Reactions  ? Latex Rash  ? Nickel Dermatitis  ? ?Medications:  ?Current Outpatient Medications:  ?  ondansetron (ZOFRAN-ODT) 8 MG disintegrating tablet, Take 1 tablet (8 mg total) by mouth every 8 (eight) hours as needed for nausea or vomiting., Disp: 20 tablet, Rfl: 0 ?  citalopram (CELEXA) 20 MG tablet, Take 1 tablet (20 mg total) by mouth daily., Disp: 90 tablet, Rfl: 3 ?  Semaglutide-Weight Management (WEGOVY) 0.5 MG/0.5ML SOAJ, Inject 0.5 mg into  the skin once a week., Disp: 2 mL, Rfl: 2 ? ?Observations/Objective: ?Patient is well-developed, well-nourished   ?Patient in apparent discomfort in shower ?Head is normocephalic, atraumatic.  ?No labored breathing.  ?Speech is clear and coherent with logical content.  ?Patient is alert and oriented at baseline.  ? ?Assessment and Plan: ?1. Viral gastroenteritis ?- ondansetron (ZOFRAN-ODT) 8 MG disintegrating tablet; Take 1 tablet (8 mg total) by mouth every 8 (eight) hours as needed for nausea or vomiting.  Dispense: 20 tablet; Refill: 0 ? ?- Patient in significant distress and vomiting bilous looking material (bright yellow vomitus noted in a trash can shown to provider) ?- Unable to be out of hot shower in room temperature due to severe chills ?- Advised patient's father to take patient to ER ASAP or call 9-1-1 if he did not feel safe transporting; he said he would transport to [facility] ?- Zofran sent as father mentioned pharmacy closed at 7pm and he wanted to have his wife get medication while they were at ER to have tonight in case she was discharged ? ?Follow Up Instructions: ?I discussed the assessment and treatment plan with the patient. The patient was provided an opportunity to ask questions and all were answered. The patient agreed with the plan and demonstrated an understanding of the instructions.  A copy of instructions were sent to the patient via MyChart unless otherwise noted below.  ? ? ?The patient was advised to call back or seek an in-person evaluation if the symptoms worsen or if the condition fails to improve as anticipated. ? ?Time:  ?I spent 15 minutes with the patient via telehealth technology discussing the above problems/concerns.   ? ?Margaretann Loveless, PA-C ?

## 2021-10-11 NOTE — Patient Instructions (Signed)
?  [facility], thank you for joining Margaretann Loveless, PA-C for today's virtual visit.  While this provider is not your primary care provider (PCP), if your PCP is located in our provider database this encounter information will be shared with them immediately following your visit. ? ?Consent: ?(Patient) Barbara Lutz provided verbal consent for this virtual visit at the beginning of the encounter. ? ?Current Medications: ? ?Current Outpatient Medications:  ?  ondansetron (ZOFRAN-ODT) 8 MG disintegrating tablet, Take 1 tablet (8 mg total) by mouth every 8 (eight) hours as needed for nausea or vomiting., Disp: 20 tablet, Rfl: 0 ?  citalopram (CELEXA) 20 MG tablet, Take 1 tablet (20 mg total) by mouth daily., Disp: 90 tablet, Rfl: 3 ?  Semaglutide-Weight Management (WEGOVY) 0.5 MG/0.5ML SOAJ, Inject 0.5 mg into the skin once a week., Disp: 2 mL, Rfl: 2  ? ?Medications ordered in this encounter:  ?Meds ordered this encounter  ?Medications  ? ondansetron (ZOFRAN-ODT) 8 MG disintegrating tablet  ?  Sig: Take 1 tablet (8 mg total) by mouth every 8 (eight) hours as needed for nausea or vomiting.  ?  Dispense:  20 tablet  ?  Refill:  0  ?  Order Specific Question:   Supervising Provider  ?  Answer:   Eber Hong [[id number]]  ?  ? ?*If you need refills on other medications prior to your next appointment, please contact your pharmacy* ? ?Follow-Up: ?Call back or seek an in-person evaluation if the symptoms worsen or if the condition fails to improve as anticipated. ? ?Other Instructions ? ?Seek immediate evaluation at closest ER ? ? ?If you have been instructed to have an in-person evaluation today at a local Urgent Care facility, please use the link below. It will take you to a list of all of our available [facility], including address, phone number and hours of operation. Please do not delay care.  ?[facility] ? ?If you or a family member do not have a primary care provider, use the link  below to schedule a visit and establish care. When you choose a [facility] primary care physician or advanced practice provider, you gain a long-term partner in health. ?Find a Primary Care Provider ? ?Learn more about [facility]'s in-office and virtual care options: ?[facility] - Get Care Now ?

## 2021-10-15 ENCOUNTER — Other Ambulatory Visit (HOSPITAL_BASED_OUTPATIENT_CLINIC_OR_DEPARTMENT_OTHER): Payer: Self-pay

## 2021-10-19 ENCOUNTER — Ambulatory Visit (INDEPENDENT_AMBULATORY_CARE_PROVIDER_SITE_OTHER): Payer: No Typology Code available for payment source | Admitting: Family Medicine

## 2021-10-19 ENCOUNTER — Encounter: Payer: No Typology Code available for payment source | Admitting: Family Medicine

## 2021-10-19 ENCOUNTER — Encounter: Payer: Self-pay | Admitting: Family Medicine

## 2021-10-19 VITALS — BP 94/63 | HR 87 | Ht 62.0 in | Wt 159.0 lb

## 2021-10-19 DIAGNOSIS — Z803 Family history of malignant neoplasm of breast: Secondary | ICD-10-CM

## 2021-10-19 DIAGNOSIS — F411 Generalized anxiety disorder: Secondary | ICD-10-CM | POA: Diagnosis not present

## 2021-10-19 DIAGNOSIS — E669 Obesity, unspecified: Secondary | ICD-10-CM

## 2021-10-19 DIAGNOSIS — Z0000 Encounter for general adult medical examination without abnormal findings: Secondary | ICD-10-CM

## 2021-10-19 LAB — CBC
HCT: 39.5 % (ref 36.0–46.0)
Hemoglobin: 13.3 g/dL (ref 12.0–15.0)
MCHC: 33.6 g/dL (ref 30.0–36.0)
MCV: 94.5 fl (ref 78.0–100.0)
Platelets: 177 10*3/uL (ref 150.0–400.0)
RBC: 4.18 Mil/uL (ref 3.87–5.11)
RDW: 13.1 % (ref 11.5–15.5)
WBC: 6.2 10*3/uL (ref 4.0–10.5)

## 2021-10-19 LAB — LIPID PANEL
Cholesterol: 131 mg/dL (ref 0–200)
HDL: 55.7 mg/dL (ref >39.00)
LDL Cholesterol: 67 mg/dL (ref 0–99)
NonHDL: 75.43
Total CHOL/HDL Ratio: 2
Triglycerides: 43 mg/dL (ref 0.0–149.0)
VLDL: 8.6 mg/dL (ref 0.0–40.0)

## 2021-10-19 LAB — COMPREHENSIVE METABOLIC PANEL
ALT: 8 U/L (ref 0–35)
AST: 13 U/L (ref 0–37)
Albumin: 4.5 g/dL (ref 3.5–5.2)
Alkaline Phosphatase: 40 U/L (ref 39–117)
BUN: 7 mg/dL (ref 6–23)
CO2: 30 mEq/L (ref 19–32)
Calcium: 9.2 mg/dL (ref 8.4–10.5)
Chloride: 104 mEq/L (ref 96–112)
Creatinine, Ser: 0.95 mg/dL (ref 0.40–1.20)
GFR: 83.15 mL/min (ref >60.00)
Glucose, Bld: 75 mg/dL (ref 70–99)
Potassium: 4.2 mEq/L (ref 3.5–5.1)
Sodium: 140 mEq/L (ref 135–145)
Total Bilirubin: 0.6 mg/dL (ref 0.2–1.2)
Total Protein: 6.4 g/dL (ref 6.0–8.3)

## 2021-10-19 LAB — TSH: TSH: 0.96 u[IU]/mL (ref 0.35–5.50)

## 2021-10-19 NOTE — Assessment & Plan Note (Signed)
Doing well transitioning back to Celexa. No complaints or concerns. No SI/HI. ?

## 2021-10-19 NOTE — Assessment & Plan Note (Signed)
Doing well on 0.5 mg dose of Wegovy. No side effects. Continue healthy diet and exercise.  ?

## 2021-10-19 NOTE — Progress Notes (Signed)
? ?Complete physical exam ? ?Patient: Barbara Lutz   DOB: [birth year]   25 y.o. Female  MRN: [id number] ? ?Subjective:  ?  ?CC: CPE, no concerns ? ? ?Barbara Lutz is a 26 y.o. female who presents today for a complete physical exam. She reports consuming a  general, relatively healthy, portion control, minimal red-meat  diet. Home exercise routine includes resistance bands, home workout videos, toning, etc. She generally feels well. She reports sleeping well. She does not have additional problems to discuss today.  ? ?Doing much better after switching back to Celexa;  ?Weight is responding well to [facility] at current dose. No concerns.  ? ? ? ?Most recent fall risk assessment: ? ?  10/19/2021  ?  8:25 AM  ?Fall Risk   ?Falls in the past year? 0  ?Number falls in past yr: 0  ?Injury with Fall? 0  ?Risk for fall due to : No Fall Risks  ?Follow up Falls evaluation completed  ? ?  ?Most recent depression screenings: ? ?  10/19/2021  ?  8:44 AM 09/06/2021  ?  4:45 PM  ?PHQ 2/9 Scores  ?PHQ - 2 Score 3 2  ?PHQ- 9 Score 7 5  ? ? ?Vision:no concerns, Dental: No current dental problems and Receives regular dental care, and STD: no concerns ? ? ? ?Patient Care Team: ?Terrilyn Saver, NP as PCP - General (Family Medicine)  ? ?Outpatient Medications Prior to Visit  ?Medication Sig  ? citalopram (CELEXA) 20 MG tablet Take 1 tablet (20 mg total) by mouth daily.  ? Semaglutide-Weight Management (WEGOVY) 0.5 MG/0.5ML SOAJ Inject 0.5 mg into the skin once a week.  ? [DISCONTINUED] ondansetron (ZOFRAN-ODT) 8 MG disintegrating tablet Take 1 tablet (8 mg total) by mouth every 8 (eight) hours as needed for nausea or vomiting.  ? ?No facility-administered medications prior to visit.  ? ? ?ROS ?All review of systems negative except what is listed in the HPI ? ? ? ? ?   ?Objective:  ? ?  ?BP 94/63   Pulse 87   Ht '5\' 2"'  (1.575 m)   Wt 159 lb (72.1 kg)   BMI 29.08 kg/m?  ? ? ?Physical Exam ?Vitals reviewed.  ?Constitutional:   ?   General: She  is not in acute distress. ?   Appearance: Normal appearance. She is not ill-appearing.  ?HENT:  ?   Head: Normocephalic and atraumatic.  ?   Right Ear: Tympanic membrane normal.  ?   Left Ear: Tympanic membrane normal.  ?   Nose: Nose normal.  ?   Mouth/Throat:  ?   Mouth: Mucous membranes are moist.  ?   Pharynx: Oropharynx is clear.  ?Eyes:  ?   Extraocular Movements: Extraocular movements intact.  ?   Conjunctiva/sclera: Conjunctivae normal.  ?   Pupils: Pupils are equal, round, and reactive to light.  ?Cardiovascular:  ?   Rate and Rhythm: Normal rate and regular rhythm.  ?Pulmonary:  ?   Effort: Pulmonary effort is normal.  ?   Breath sounds: Normal breath sounds.  ?Abdominal:  ?   General: Abdomen is flat. Bowel sounds are normal.  ?   Palpations: Abdomen is soft.  ?Musculoskeletal:     ?   General: Normal range of motion.  ?   Cervical back: Normal range of motion and neck supple.  ?Skin: ?   General: Skin is warm and dry.  ?Neurological:  ?   General: No focal deficit present.  ?  Mental Status: She is alert and oriented to person, place, and time. Mental status is at baseline.  ?Psychiatric:     ?   Mood and Affect: Mood normal.     ?   Behavior: Behavior normal.     ?   Thought Content: Thought content normal.     ?   Judgment: Judgment normal.  ?  ? ?No results found for any visits on 10/19/21. ? ?   ?Assessment & Plan:  ?  ?Routine Health Maintenance and Physical Exam ? ?Immunization History  ?Administered Date(s) Administered  ? DTaP 03/19/1996, 05/17/1996, 08/03/1996, 08/19/1997, 09/25/2000  ? DTaP / HiB 03/19/1996, 05/17/1996, 08/03/1996, 02/01/1997  ? HPV Quadrivalent 03/30/2009, 03/07/2010, 07/03/2010  ? Hepatitis A, Ped/Adol-2 Dose 03/10/2008, 03/30/2009  ? Hepatitis B, ped/adol 08/06/1995, 03/19/1996, 11/25/1996  ? IPV 03/19/1996, 05/17/1996, 08/19/1997, 09/25/2000  ? Influenza Inj Mdck Quad Pf 02/15/2018  ? Influenza, Seasonal, Injecte, Preservative Fre 04/19/2016  ? Influenza,inj,Quad PF,6+ Mos  04/19/2016, 04/19/2016, 03/18/2017, 03/22/2019  ? Influenza,inj,quad, With Preservative 03/10/2008, 03/30/2009, 03/07/2010  ? Influenza-Unspecified 03/10/2008, 03/30/2009, 03/07/2010, 03/22/2019, 03/12/2021  ? MMR 02/01/1997, 09/25/2000  ? Meningococcal B, Unspecified 03/10/2008  ? Meningococcal Conjugate 03/10/2008, 02/14/2014  ? Moderna Sars-Covid-2 Vaccination 02/05/2020, 03/04/2020  ? Pneumococcal-Unspecified 07/09/1999  ? Td 02/06/2007, 03/18/2017  ? Tdap 02/06/2007  ? Varicella 02/01/1997, 03/10/2008  ? ? ?Health Maintenance  ?Topic Date Due  ? INFLUENZA VACCINE  01/15/2022  ? PAP-Cervical Cytology Screening  06/03/2023  ? PAP SMEAR-Modifier  06/03/2023  ? TETANUS/TDAP  03/19/2027  ? HPV VACCINES  Completed  ? Hepatitis C Screening  Completed  ? HIV Screening  Completed  ? COVID-19 Vaccine  Discontinued  ? ? ?Discussed health benefits of physical activity, and encouraged her to engage in regular exercise appropriate for her age and condition. ? ?Problem List Items Addressed This Visit   ? ?  ? Other  ? Obesity (BMI 30-39.9)  ?  Doing well on 0.5 mg dose of Wegovy. No side effects. Continue healthy diet and exercise.  ? ?  ?  ? Relevant Orders  ? CBC  ? Comprehensive metabolic panel  ? Lipid panel  ? TSH  ? GAD (generalized anxiety disorder)  ?  Doing well transitioning back to Celexa. No complaints or concerns. No SI/HI. ? ?  ?  ? Relevant Orders  ? TSH  ? ?Other Visit Diagnoses   ? ? Annual physical exam    -  Primary  ? Relevant Orders  ? CBC  ? Comprehensive metabolic panel  ? Lipid panel  ? TSH  ? MM DIGITAL SCREENING BILATERAL  ? Family history of breast cancer      ? Relevant Orders  ? MM DIGITAL SCREENING BILATERAL  ? ?  ? ?Return in about 6 months (around 04/21/2022) for routine f/u . ? ?  ? ?Terrilyn Saver, NP ? ? ?

## 2021-10-31 ENCOUNTER — Telehealth: Payer: Self-pay | Admitting: *Deleted

## 2021-10-31 ENCOUNTER — Other Ambulatory Visit (HOSPITAL_BASED_OUTPATIENT_CLINIC_OR_DEPARTMENT_OTHER): Payer: Self-pay

## 2021-10-31 ENCOUNTER — Other Ambulatory Visit: Payer: Self-pay | Admitting: *Deleted

## 2021-10-31 MED ORDER — SEMAGLUTIDE-WEIGHT MANAGEMENT 1 MG/0.5ML ~~LOC~~ SOAJ
1.0000 mg | SUBCUTANEOUS | 0 refills | Status: DC
  Filled 2021-10-31: qty 2, 28d supply, fill #0

## 2021-10-31 NOTE — Telephone Encounter (Signed)
PA submitted to continue [facility]. ?

## 2021-11-01 ENCOUNTER — Other Ambulatory Visit (HOSPITAL_BASED_OUTPATIENT_CLINIC_OR_DEPARTMENT_OTHER): Payer: Self-pay

## 2021-11-02 ENCOUNTER — Other Ambulatory Visit (HOSPITAL_BASED_OUTPATIENT_CLINIC_OR_DEPARTMENT_OTHER): Payer: Self-pay

## 2021-11-02 NOTE — Telephone Encounter (Signed)
The request has been approved. The authorization is effective for a maximum of 12 fills from 11/02/2021 to 11/02/2022, as long as the member is enrolled in their current health plan.

## 2021-11-05 ENCOUNTER — Other Ambulatory Visit (HOSPITAL_BASED_OUTPATIENT_CLINIC_OR_DEPARTMENT_OTHER): Payer: Self-pay

## 2021-11-21 ENCOUNTER — Other Ambulatory Visit: Payer: Self-pay | Admitting: *Deleted

## 2021-11-21 ENCOUNTER — Ambulatory Visit: Payer: No Typology Code available for payment source | Admitting: Family Medicine

## 2021-11-21 ENCOUNTER — Other Ambulatory Visit (HOSPITAL_BASED_OUTPATIENT_CLINIC_OR_DEPARTMENT_OTHER): Payer: Self-pay

## 2021-11-21 MED ORDER — SEMAGLUTIDE-WEIGHT MANAGEMENT 1.7 MG/0.75ML ~~LOC~~ SOAJ
1.7000 mg | SUBCUTANEOUS | 0 refills | Status: DC
  Filled 2021-11-21: qty 3, 28d supply, fill #0

## 2021-12-04 ENCOUNTER — Ambulatory Visit (HOSPITAL_BASED_OUTPATIENT_CLINIC_OR_DEPARTMENT_OTHER)
Admission: RE | Admit: 2021-12-04 | Discharge: 2021-12-04 | Disposition: A | Discharge status: Home / Self Care | Payer: No Typology Code available for payment source | Source: Ambulatory Visit | Attending: Family Medicine | Admitting: Family Medicine

## 2021-12-04 ENCOUNTER — Encounter (HOSPITAL_BASED_OUTPATIENT_CLINIC_OR_DEPARTMENT_OTHER): Payer: Self-pay

## 2021-12-04 DIAGNOSIS — Z803 Family history of malignant neoplasm of breast: Secondary | ICD-10-CM | POA: Insufficient documentation

## 2021-12-04 DIAGNOSIS — Z1231 Encounter for screening mammogram for malignant neoplasm of breast: Secondary | ICD-10-CM | POA: Insufficient documentation

## 2021-12-04 DIAGNOSIS — Z0000 Encounter for general adult medical examination without abnormal findings: Secondary | ICD-10-CM | POA: Diagnosis present

## 2021-12-14 ENCOUNTER — Encounter: Payer: Self-pay | Admitting: Family Medicine

## 2021-12-14 ENCOUNTER — Ambulatory Visit (INDEPENDENT_AMBULATORY_CARE_PROVIDER_SITE_OTHER): Payer: No Typology Code available for payment source | Admitting: Family Medicine

## 2021-12-14 VITALS — BP 121/76 | HR 82 | Ht 62.0 in | Wt 149.8 lb

## 2021-12-14 DIAGNOSIS — Z7689 Persons encountering health services in other specified circumstances: Secondary | ICD-10-CM

## 2021-12-14 DIAGNOSIS — F411 Generalized anxiety disorder: Secondary | ICD-10-CM

## 2021-12-14 DIAGNOSIS — F32A Depression, unspecified: Secondary | ICD-10-CM | POA: Diagnosis not present

## 2021-12-14 MED ORDER — CITALOPRAM HYDROBROMIDE 40 MG PO TABS: 40.0000 mg | ORAL_TABLET | Freq: Every day | ORAL | 0 refills | Status: DC

## 2021-12-14 NOTE — Progress Notes (Signed)
Acute Office Visit  Subjective:     Patient ID: Barbara Lutz, female    DOB: 31-Aug-1995, 26 y.o.   MRN: [id number]  CC: medication follow-up   HPI Patient is in today for medication follow-up.   She restarted her Celexa in April and was doing much better since restarting. However she states that over the past month or two, her mood has been worsening. She reports that the last few weeks especially have been hard as she has been going through a breakup. She is feeling more lonely and anxious when at home alone now. States she does fine at work and does not feel like she has any trouble focusing. She denies any SI/HI. States she has a good support system with her family, but certain situations can feel burdensome at times. States she has felt easily irritable and more emotional than usual. She is interested in seeing if an increase in her Celexa would help. Not interested in counseling at this time.     Wegovy: She is still doing well, almost at her goal weight. Reports when she first started the 1.7 mg dose she did feel sick, but has since then been feeling good overall other than some mild morning hunger/gas pains which resolve with eating - no abdominal pain, indigestion, blood in urine/stool, vomiting.         12/14/2021   11:19 AM 10/19/2021    8:44 AM 09/06/2021    4:45 PM  PHQ9 SCORE ONLY  PHQ-9 Total Score 16 7 5        12/14/2021   11:20 AM 10/19/2021    8:44 AM 09/06/2021    4:45 PM 03/19/2021    8:09 AM  GAD 7 : Generalized Anxiety Score  Nervous, Anxious, on Edge 2 2 2 3   Control/stop worrying 3 2 1 3   Worry too much - different things 3 2 1 3   Trouble relaxing 1 1 1 2   Restless 1 1 1  0  Easily annoyed or irritable 3 1 1 1   Afraid - awful might happen 1 1 0 0  Total GAD 7 Score 14 10 7 12   Anxiety Difficulty Very difficult Somewhat difficult Somewhat difficult       ROS All review of systems negative except what is listed in the HPI      Objective:    BP  121/76   Pulse 82   Ht 5\' 2"  (1.575 m)   Wt 149 lb 12.8 oz (67.9 kg)   LMP 11/27/2021   BMI 27.40 kg/m    Physical Exam Vitals reviewed.  Constitutional:      Appearance: Normal appearance.  HENT:     Head: Normocephalic and atraumatic.  Musculoskeletal:     Right lower leg: No edema.     Left lower leg: No edema.  Skin:    General: Skin is warm and dry.  Neurological:     General: No focal deficit present.     Mental Status: She is alert and oriented to person, place, and time. Mental status is at baseline.  Psychiatric:        Mood and Affect: Mood normal.        Behavior: Behavior normal.        Thought Content: Thought content normal.        Judgment: Judgment normal.      No results found for any visits on 12/14/21.      Assessment & Plan:    1. GAD (generalized anxiety disorder)  2. Depression, unspecified depression type No SI/HI. Discussed options with patient. She would like to start by trying to increase Celexa to see if that helps before making any other changes. Declines psych referral or counseling at this time.   - citalopram (CELEXA) 40 MG tablet; Take 1 tablet (40 mg total) by mouth daily.  Dispense: 90 tablet; Refill: 0   3. Weight management Doing really well now on current dose. Finish out this dose and we can discuss next steps. Discussed importance of eating small meals throughout the day to ensure blood sugar is not dropping too low. If feeling more like gas pains or indigestion, could try daily omeprazole to see if that makes any difference. Patient aware of signs/symptoms requiring further/urgent evaluation.    Return in about 6 weeks (around 01/25/2022) for med f/u .  Clayborne Dana, NP

## 2021-12-17 ENCOUNTER — Ambulatory Visit: Payer: No Typology Code available for payment source | Admitting: Family Medicine

## 2021-12-24 ENCOUNTER — Telehealth: Payer: Self-pay | Admitting: Family Medicine

## 2021-12-24 ENCOUNTER — Other Ambulatory Visit (HOSPITAL_BASED_OUTPATIENT_CLINIC_OR_DEPARTMENT_OTHER): Payer: Self-pay

## 2021-12-24 ENCOUNTER — Other Ambulatory Visit: Payer: Self-pay | Admitting: *Deleted

## 2021-12-24 DIAGNOSIS — F411 Generalized anxiety disorder: Secondary | ICD-10-CM

## 2021-12-24 DIAGNOSIS — F32A Depression, unspecified: Secondary | ICD-10-CM

## 2021-12-24 MED ORDER — SEMAGLUTIDE-WEIGHT MANAGEMENT 1 MG/0.5ML ~~LOC~~ SOAJ
1.0000 mg | SUBCUTANEOUS | 0 refills | Status: DC
  Filled 2021-12-24: qty 2, 28d supply, fill #0

## 2021-12-24 MED ORDER — CITALOPRAM HYDROBROMIDE 40 MG PO TABS
40.0000 mg | ORAL_TABLET | Freq: Every day | ORAL | 0 refills | Status: DC
  Filled 2021-12-24 – 2022-01-01 (×2): qty 90, 90d supply, fill #0

## 2021-12-24 NOTE — Telephone Encounter (Signed)
Pt requesting refill on Wegovy to start coming off the meds, last meds Wegovy 0.1 was this morning, pt also requesting refill of citalopram (CELEXA) 40 MG tablet sent to (last refill [facility] to [facility]-pt is needing to have it [facility] to [facility]) [facility]. Please advise.

## 2021-12-24 NOTE — Telephone Encounter (Signed)
Pt is tapering down Wegovy so I refilled 1mg  and sent Citalopram to downstairs pharmacy.

## 2021-12-25 ENCOUNTER — Other Ambulatory Visit (HOSPITAL_BASED_OUTPATIENT_CLINIC_OR_DEPARTMENT_OTHER): Payer: Self-pay

## 2022-01-01 ENCOUNTER — Other Ambulatory Visit (HOSPITAL_BASED_OUTPATIENT_CLINIC_OR_DEPARTMENT_OTHER): Payer: Self-pay

## 2022-01-04 ENCOUNTER — Other Ambulatory Visit (HOSPITAL_BASED_OUTPATIENT_CLINIC_OR_DEPARTMENT_OTHER): Payer: Self-pay

## 2022-01-11 ENCOUNTER — Telehealth (INDEPENDENT_AMBULATORY_CARE_PROVIDER_SITE_OTHER): Payer: No Typology Code available for payment source | Admitting: Family Medicine

## 2022-01-11 ENCOUNTER — Encounter: Payer: Self-pay | Admitting: Family Medicine

## 2022-01-11 DIAGNOSIS — N946 Dysmenorrhea, unspecified: Secondary | ICD-10-CM

## 2022-01-11 DIAGNOSIS — F411 Generalized anxiety disorder: Secondary | ICD-10-CM

## 2022-01-11 NOTE — Progress Notes (Signed)
Virtual Video Visit via MyChart Note  I connected with  [name] on 01/11/22 at  8:20 AM EDT by the video enabled telemedicine application for MyChart, and verified that I am speaking with the correct person using two identifiers.   I introduced myself as a Publishing rights manager with the practice. We discussed the limitations of evaluation and management by telemedicine and the availability of in person appointments. The patient expressed understanding and agreed to proceed.  Participating parties in this visit include: The patient and the nurse practitioner listed.  The patient is: At home I am: In the office - [facility]  Subjective:    CC: mood f/u    HPI: Barbara Lutz is a 26 y.o. year old female presenting today via MyChart today for mood follow-up.  Aylla reports she has been doing really good ont he 40 mg of Celexa. She is still slightly irritable at times, but mood is overall much improved. She is sleeping better as well. No SI/HI. No medication side effects.   Additionally, she would like to discuss her dysmenorrhea. She has been struggling with painful periods for years, but her mom was recently diagnosed with endometriosis and is concerned she may have this as well. States that when she went on birth control several years ago, it did decrease the heavy flow, and that has continued even since stopping birth control several years ago. However, she has had worsening of other symptoms during menstruation including increasing pain and GI symptoms. Every time she gets her period, she has at least a day or two of sensitive stomach, nausea, vomiting, GI upset, and significant cramping/pain.         01/11/2022    8:15 AM 12/14/2021   11:19 AM 10/19/2021    8:44 AM  PHQ9 SCORE ONLY  PHQ-9 Total Score 5 16 7       01/11/2022    8:17 AM 12/14/2021   11:20 AM 10/19/2021    8:44 AM 09/06/2021    4:45 PM  GAD 7 : Generalized Anxiety Score  Nervous,  Anxious, on Edge 1 2 2 2   Control/stop worrying 0 3 2 1   Worry too much - different things 1 3 2 1   Trouble relaxing 1 1 1 1   Restless 0 1 1 1   Easily annoyed or irritable 2 3 1 1   Afraid - awful might happen 0 1 1 0  Total GAD 7 Score 5 14 10 7   Anxiety Difficulty Somewhat difficult Very difficult Somewhat difficult Somewhat difficult        Past medical history, Surgical history, Family history not pertinant except as noted below, Social history, Allergies, and medications have been entered into the medical record, reviewed, and corrections made.   Review of Systems:  All review of systems negative except what is listed in the HPI   Objective:    General:  Speaking clearly in complete sentences. Absent shortness of breath noted.   Alert and oriented x3.   Normal judgment.  Absent acute distress.   Impression and Recommendations:    1. GAD (generalized anxiety disorder) No SI/HI.  Doing really well on Celexa 40 mg. Continue current dose.    2. Dysmenorrhea Given ongoing, worsening symptoms with/without birth control and family history, will order ultrasound. Pending results, recommend she follow-up with her OBGYN. Patient aware of signs/symptoms requiring further/urgent evaluation.   Follow-up if symptoms worsen or fail to improve. Routine f/u in 3-6 months.   I discussed  the assessment and treatment plan with the patient. The patient was provided an opportunity to ask questions and all were answered. The patient agreed with the plan and demonstrated an understanding of the instructions.   The patient was advised to call back or seek an in-person evaluation if the symptoms worsen or if the condition fails to improve as anticipated.   Clayborne Dana, NP

## 2022-01-11 NOTE — Progress Notes (Signed)
Doing well on Citalopram 40mg  Discuss possible ultrasound for endometriosis

## 2022-01-15 ENCOUNTER — Ambulatory Visit (HOSPITAL_BASED_OUTPATIENT_CLINIC_OR_DEPARTMENT_OTHER): Payer: No Typology Code available for payment source

## 2022-01-21 ENCOUNTER — Ambulatory Visit (HOSPITAL_BASED_OUTPATIENT_CLINIC_OR_DEPARTMENT_OTHER)
Admission: RE | Admit: 2022-01-21 | Discharge: 2022-01-21 | Disposition: A | Discharge status: Home / Self Care | Payer: No Typology Code available for payment source | Source: Ambulatory Visit | Attending: Family Medicine | Admitting: Family Medicine

## 2022-01-21 DIAGNOSIS — N946 Dysmenorrhea, unspecified: Secondary | ICD-10-CM | POA: Insufficient documentation

## 2022-01-22 ENCOUNTER — Other Ambulatory Visit (HOSPITAL_BASED_OUTPATIENT_CLINIC_OR_DEPARTMENT_OTHER): Payer: Self-pay

## 2022-01-22 ENCOUNTER — Other Ambulatory Visit: Payer: Self-pay | Admitting: *Deleted

## 2022-01-22 DIAGNOSIS — N946 Dysmenorrhea, unspecified: Secondary | ICD-10-CM

## 2022-01-22 DIAGNOSIS — E669 Obesity, unspecified: Secondary | ICD-10-CM

## 2022-01-22 MED ORDER — WEGOVY 0.5 MG/0.5ML ~~LOC~~ SOAJ
0.5000 mg | SUBCUTANEOUS | 0 refills | Status: DC
  Filled 2022-01-22 – 2022-01-29 (×2): qty 2, 28d supply, fill #0

## 2022-01-28 ENCOUNTER — Other Ambulatory Visit (HOSPITAL_BASED_OUTPATIENT_CLINIC_OR_DEPARTMENT_OTHER): Payer: Self-pay

## 2022-01-29 ENCOUNTER — Other Ambulatory Visit (HOSPITAL_BASED_OUTPATIENT_CLINIC_OR_DEPARTMENT_OTHER): Payer: Self-pay

## 2022-01-30 ENCOUNTER — Emergency Department (HOSPITAL_BASED_OUTPATIENT_CLINIC_OR_DEPARTMENT_OTHER): Payer: No Typology Code available for payment source

## 2022-01-30 ENCOUNTER — Other Ambulatory Visit: Payer: Self-pay

## 2022-01-30 ENCOUNTER — Emergency Department (HOSPITAL_BASED_OUTPATIENT_CLINIC_OR_DEPARTMENT_OTHER)
Admission: EM | Admit: 2022-01-30 | Discharge: 2022-01-31 | Disposition: A | Discharge status: Home / Self Care | Payer: No Typology Code available for payment source | Attending: Emergency Medicine | Admitting: Emergency Medicine

## 2022-01-30 ENCOUNTER — Encounter (HOSPITAL_BASED_OUTPATIENT_CLINIC_OR_DEPARTMENT_OTHER): Payer: Self-pay | Admitting: Emergency Medicine

## 2022-01-30 DIAGNOSIS — R112 Nausea with vomiting, unspecified: Secondary | ICD-10-CM | POA: Insufficient documentation

## 2022-01-30 DIAGNOSIS — Z91040 Latex allergy status: Secondary | ICD-10-CM | POA: Diagnosis not present

## 2022-01-30 DIAGNOSIS — R197 Diarrhea, unspecified: Secondary | ICD-10-CM | POA: Diagnosis not present

## 2022-01-30 DIAGNOSIS — R7989 Other specified abnormal findings of blood chemistry: Secondary | ICD-10-CM

## 2022-01-30 DIAGNOSIS — R7401 Elevation of levels of liver transaminase levels: Secondary | ICD-10-CM | POA: Insufficient documentation

## 2022-01-30 DIAGNOSIS — R1011 Right upper quadrant pain: Secondary | ICD-10-CM | POA: Diagnosis not present

## 2022-01-30 DIAGNOSIS — R1010 Upper abdominal pain, unspecified: Secondary | ICD-10-CM

## 2022-01-30 LAB — CBC WITH DIFFERENTIAL/PLATELET
Abs Immature Granulocytes: 0.03 10*3/uL (ref 0.00–0.07)
Basophils Absolute: 0 10*3/uL (ref 0.0–0.1)
Basophils Relative: 0 %
Eosinophils Absolute: 0 10*3/uL (ref 0.0–0.5)
Eosinophils Relative: 0 %
HCT: 38.6 % (ref 36.0–46.0)
Hemoglobin: 13.5 g/dL (ref 12.0–15.0)
Immature Granulocytes: 0 %
Lymphocytes Relative: 4 %
Lymphs Abs: 0.4 10*3/uL — ABNORMAL LOW (ref 0.7–4.0)
MCH: 32.9 pg (ref 26.0–34.0)
MCHC: 35 g/dL (ref 30.0–36.0)
MCV: 94.1 fL (ref 80.0–100.0)
Monocytes Absolute: 0.5 10*3/uL (ref 0.1–1.0)
Monocytes Relative: 5 %
Neutro Abs: 8.5 10*3/uL — ABNORMAL HIGH (ref 1.7–7.7)
Neutrophils Relative %: 91 %
Platelets: 215 10*3/uL (ref 150–400)
RBC: 4.1 MIL/uL (ref 3.87–5.11)
RDW: 12 % (ref 11.5–15.5)
WBC: 9.4 10*3/uL (ref 4.0–10.5)
nRBC: 0 % (ref 0.0–0.2)

## 2022-01-30 LAB — COMPREHENSIVE METABOLIC PANEL
ALT: 817 U/L — ABNORMAL HIGH (ref 0–44)
AST: 1242 U/L — ABNORMAL HIGH (ref 15–41)
Albumin: 4.7 g/dL (ref 3.5–5.0)
Alkaline Phosphatase: 98 U/L (ref 38–126)
Anion gap: 11 (ref 5–15)
BUN: 15 mg/dL (ref 6–20)
CO2: 22 mmol/L (ref 22–32)
Calcium: 9.5 mg/dL (ref 8.9–10.3)
Chloride: 108 mmol/L (ref 98–111)
Creatinine, Ser: 1.04 mg/dL — ABNORMAL HIGH (ref 0.44–1.00)
GFR, Estimated: >60 mL/min (ref >60)
Glucose, Bld: 166 mg/dL — ABNORMAL HIGH (ref 70–99)
Potassium: 3.8 mmol/L (ref 3.5–5.1)
Sodium: 141 mmol/L (ref 135–145)
Total Bilirubin: 2.2 mg/dL — ABNORMAL HIGH (ref 0.3–1.2)
Total Protein: 7.7 g/dL (ref 6.5–8.1)

## 2022-01-30 LAB — HCG, SERUM, QUALITATIVE: Preg, Serum: NEGATIVE

## 2022-01-30 LAB — LIPASE, BLOOD: Lipase: 25 U/L (ref 11–51)

## 2022-01-30 MED ORDER — SODIUM CHLORIDE 0.9 % IV BOLUS
1000.0000 mL | Freq: Once | INTRAVENOUS | Status: AC
  Administered 2022-01-30: 1000 mL via INTRAVENOUS

## 2022-01-30 MED ORDER — ONDANSETRON HCL 4 MG/2ML IJ SOLN
4.0000 mg | Freq: Once | INTRAMUSCULAR | Status: AC
  Administered 2022-01-30: 4 mg via INTRAVENOUS
  Filled 2022-01-30: qty 2

## 2022-01-30 MED ORDER — FENTANYL CITRATE PF 50 MCG/ML IJ SOSY
50.0000 ug | PREFILLED_SYRINGE | INTRAMUSCULAR | Status: AC | PRN
  Administered 2022-01-30 (×2): 50 ug via INTRAVENOUS
  Filled 2022-01-30 (×2): qty 1

## 2022-01-30 NOTE — ED Provider Notes (Incomplete)
[facility] EMERGENCY DEPARTMENT Provider Note   CSN: [id number] Arrival date & time: 01/30/22  2051     History {Add pertinent medical, surgical, social history, OB history to HPI:1} Chief Complaint  Patient presents with  . Abdominal Pain  . Emesis    Barbara Lutz is a 26 y.o. female with a past medical history significant for anxiety who presents to the ED due to abdominal pain associated with nausea, vomiting, and diarrhea that started earlier today.  Patient admits to intermittent abdominal pain associated with nausea and vomiting typically after eating.  She admits to numerous episodes of NBNB emesis and non-bloody diarrhea. No fever or chills.  No previous abdominal operations.  Denies vaginal symptoms.  No urinary symptoms. She is currently taking Wegovy for weight loss. Last night was her birthday and she admits to drinking 2 drinks; however does not normally drink.   History obtained from patient and past medical records. No interpreter used during encounter.       Home Medications Prior to Admission medications   Medication Sig Start Date End Date Taking? Authorizing Provider  citalopram (CELEXA) 40 MG tablet Take 1 tablet (40 mg total) by mouth daily. 12/24/21   Clayborne Dana, NP  Semaglutide-Weight Management (WEGOVY) 0.5 MG/0.5ML SOAJ Inject 0.5 mg into the skin once a week. 01/22/22   Clayborne Dana, NP      Allergies    Latex and Nickel    Review of Systems   Review of Systems  Constitutional:  Negative for chills and fever.  Gastrointestinal:  Positive for abdominal pain, diarrhea, nausea and vomiting.  Genitourinary:  Negative for dysuria and vaginal discharge.  Musculoskeletal:  Negative for back pain.    Physical Exam Updated Vital Signs BP (!) 111/56   Pulse 71   Temp 98 F (36.7 C)   Resp 16   Ht 5\' 2"  (1.575 m)   Wt 69.9 kg   LMP 01/27/2022 (Exact Date)   SpO2 99%   BMI 28.17 kg/m  Physical Exam Vitals and nursing note reviewed.   Constitutional:      General: She is not in acute distress.    Appearance: She is not ill-appearing.  HENT:     Head: Normocephalic.  Eyes:     Pupils: Pupils are equal, round, and reactive to light.  Cardiovascular:     Rate and Rhythm: Normal rate and regular rhythm.     Pulses: Normal pulses.     Heart sounds: Normal heart sounds. No murmur heard.    No friction rub. No gallop.  Pulmonary:     Effort: Pulmonary effort is normal.     Breath sounds: Normal breath sounds.  Abdominal:     General: Abdomen is flat. There is no distension.     Palpations: Abdomen is soft.     Tenderness: There is abdominal tenderness. There is no guarding or rebound.     Comments: Diffuse tenderness, most significant in upper abdomen.   Musculoskeletal:        General: Normal range of motion.     Cervical back: Neck supple.  Skin:    General: Skin is warm and dry.  Neurological:     General: No focal deficit present.     Mental Status: She is alert.  Psychiatric:        Mood and Affect: Mood normal.        Behavior: Behavior normal.     ED Results / Procedures / Treatments  Labs (all labs ordered are listed, but only abnormal results are displayed) Labs Reviewed  CBC WITH DIFFERENTIAL/PLATELET - Abnormal; Notable for the following components:      Result Value   Neutro Abs 8.5 (*)    Lymphs Abs 0.4 (*)    All other components within normal limits  COMPREHENSIVE METABOLIC PANEL - Abnormal; Notable for the following components:   Glucose, Bld 166 (*)    Creatinine, Ser 1.04 (*)    AST 1,242 (*)    ALT 817 (*)    Total Bilirubin 2.2 (*)    All other components within normal limits  LIPASE, BLOOD  HCG, SERUM, QUALITATIVE    EKG None  Radiology No results found.  Procedures Procedures  {Document cardiac monitor, telemetry assessment procedure when appropriate:1}  Medications Ordered in ED Medications  fentaNYL (SUBLIMAZE) injection 50 mcg (50 mcg Intravenous Given 01/30/22  2139)  ondansetron (ZOFRAN) injection 4 mg (4 mg Intravenous Given 01/30/22 2139)    ED Course/ Medical Decision Making/ A&P Clinical Course as of 01/30/22 2239  Wed Jan 30, 2022  2228 AST(!): 1,242 [CA]  2228 ALT(!): 817 [CA]    Clinical Course User Index [CA] Mannie Stabile, PA-C                           Medical Decision Making Amount and/or Complexity of Data Reviewed Labs: ordered. Decision-making details documented in ED Course. Radiology: ordered.  Risk Prescription drug management.   26 year old female presents to the ED due to abdominal pain associated with numerous episodes of nonbloody, nonbilious emesis and nonbloody diarrhea.  Patient states she has been having intermittent episodes recently however, this most acute episode started earlier today.  Patient is currently on Wegovy for weight loss.  Upon arrival, vitals all within normal limits.  Patient no acute distress.  Abdomen soft, nondistended with diffuse tenderness most significant in upper quadrants.  Abdominal labs ordered.     CMP significant for elevated AST at 1242 and ALT at 817. RUQ [place] ordered to rule out gallbladder etiology.  CBC reassuring.  No leukocytosis and normal hemoglobin.  Lipase normal at 25.  Doubt pancreatitis.     {Document critical care time when appropriate:1} {Document review of labs and clinical decision tools ie heart score, Chads2Vasc2 etc:1}  {Document your independent review of radiology images, and any outside records:1} {Document your discussion with family members, caretakers, and with consultants:1} {Document social determinants of health affecting pt's care:1} {Document your decision making why or why not admission, treatments were needed:1} Final Clinical Impression(s) / ED Diagnoses Final diagnoses:  None    Rx / DC Orders ED Discharge Orders     None

## 2022-01-30 NOTE — ED Provider Notes (Signed)
[facility] EMERGENCY DEPARTMENT Provider Note   CSN: [id number] Arrival date & time: 01/30/22  2051     History {Add pertinent medical, surgical, social history, OB history to HPI:1} Chief Complaint  Patient presents with   Abdominal Pain   Emesis    Barbara Lutz is a 26 y.o. female with a past medical history significant for anxiety who presents to the ED due to abdominal pain associated with nausea, vomiting, and diarrhea that started earlier today.  Patient admits to intermittent abdominal pain associated with nausea and vomiting typically after eating.  She admits to numerous episodes of NBNB emesis and non-bloody diarrhea. No fever or chills.  No previous abdominal operations.  Denies vaginal symptoms.  No urinary symptoms. She is currently taking Wegovy for weight loss.   History obtained from patient and past medical records. No interpreter used during encounter.       Home Medications Prior to Admission medications   Medication Sig Start Date End Date Taking? Authorizing Provider  citalopram (CELEXA) 40 MG tablet Take 1 tablet (40 mg total) by mouth daily. 12/24/21   Clayborne Dana, NP  Semaglutide-Weight Management (WEGOVY) 0.5 MG/0.5ML SOAJ Inject 0.5 mg into the skin once a week. 01/22/22   Clayborne Dana, NP      Allergies    Latex and Nickel    Review of Systems   Review of Systems  Constitutional:  Negative for chills and fever.  Gastrointestinal:  Positive for abdominal pain, diarrhea, nausea and vomiting.  Genitourinary:  Negative for dysuria and vaginal discharge.  Musculoskeletal:  Negative for back pain.    Physical Exam Updated Vital Signs BP (!) 111/56   Pulse 71   Temp 98 F (36.7 C)   Resp 16   Ht 5\' 2"  (1.575 m)   Wt 69.9 kg   LMP 01/27/2022 (Exact Date)   SpO2 99%   BMI 28.17 kg/m  Physical Exam  ED Results / Procedures / Treatments   Labs (all labs ordered are listed, but only abnormal results are displayed) Labs Reviewed   CBC WITH DIFFERENTIAL/PLATELET - Abnormal; Notable for the following components:      Result Value   Neutro Abs 8.5 (*)    Lymphs Abs 0.4 (*)    All other components within normal limits  COMPREHENSIVE METABOLIC PANEL - Abnormal; Notable for the following components:   Glucose, Bld 166 (*)    Creatinine, Ser 1.04 (*)    AST 1,242 (*)    ALT 817 (*)    Total Bilirubin 2.2 (*)    All other components within normal limits  LIPASE, BLOOD  HCG, SERUM, QUALITATIVE    EKG None  Radiology No results found.  Procedures Procedures  {Document cardiac monitor, telemetry assessment procedure when appropriate:1}  Medications Ordered in ED Medications  fentaNYL (SUBLIMAZE) injection 50 mcg (50 mcg Intravenous Given 01/30/22 2139)  ondansetron (ZOFRAN) injection 4 mg (4 mg Intravenous Given 01/30/22 2139)    ED Course/ Medical Decision Making/ A&P Clinical Course as of 01/30/22 2239  Wed Jan 30, 2022  2228 AST(!): 1,242 [CA]  2228 ALT(!): 817 [CA]    Clinical Course User Index [CA] 2229, PA-C                           Medical Decision Making Amount and/or Complexity of Data Reviewed Labs: ordered. Decision-making details documented in ED Course. Radiology: ordered.  Risk Prescription drug management.   ***  {  Document critical care time when appropriate:1} {Document review of labs and clinical decision tools ie heart score, Chads2Vasc2 etc:1}  {Document your independent review of radiology images, and any outside records:1} {Document your discussion with family members, caretakers, and with consultants:1} {Document social determinants of health affecting pt's care:1} {Document your decision making why or why not admission, treatments were needed:1} Final Clinical Impression(s) / ED Diagnoses Final diagnoses:  None    Rx / DC Orders ED Discharge Orders     None

## 2022-01-30 NOTE — ED Notes (Signed)
Pt urine taken to lab but no order Per RN Bethany pt can have ice chips. Pt given the same.

## 2022-01-30 NOTE — ED Triage Notes (Signed)
Patient arrived via POV c/o abdominal pain with N/V/D x 8 hrs. Patient states taking zofran at 3pm and 8pm with no relief. Patient was at work upstairs when this started.Patient tearful in triage. Patient with emesis in triage. Patient states 10/10 pain. Patient is AO x 4, VS WDL, staggering gait.

## 2022-01-31 ENCOUNTER — Emergency Department (HOSPITAL_BASED_OUTPATIENT_CLINIC_OR_DEPARTMENT_OTHER): Payer: No Typology Code available for payment source

## 2022-01-31 LAB — HEPATITIS PANEL, ACUTE
HCV Ab: NONREACTIVE
Hep A IgM: NONREACTIVE
Hep B C IgM: NONREACTIVE
Hepatitis B Surface Ag: NONREACTIVE

## 2022-01-31 LAB — URINALYSIS, MICROSCOPIC (REFLEX)

## 2022-01-31 LAB — URINALYSIS, ROUTINE W REFLEX MICROSCOPIC
Glucose, UA: NEGATIVE mg/dL
Hgb urine dipstick: NEGATIVE
Ketones, ur: 40 mg/dL — AB
Nitrite: POSITIVE — AB
Protein, ur: 100 mg/dL — AB
Specific Gravity, Urine: 1.02 (ref 1.005–1.030)
pH: 7.5 (ref 5.0–8.0)

## 2022-01-31 MED ORDER — IOHEXOL 300 MG/ML  SOLN
100.0000 mL | Freq: Once | INTRAMUSCULAR | Status: AC | PRN
  Administered 2022-01-31: 100 mL via INTRAVENOUS

## 2022-01-31 NOTE — Discharge Instructions (Addendum)
Stop taking Wegovy.  Follow-up with your primary doctor on Friday for a recheck of your liver enzymes.  Follow-up the results of your hepatitis panel with your primary doctor as well.  Return to the emergency department if you develop worsening pain, high fever, bloody stools, or other new and concerning symptoms.

## 2022-02-01 ENCOUNTER — Other Ambulatory Visit: Payer: Self-pay

## 2022-02-01 ENCOUNTER — Other Ambulatory Visit (INDEPENDENT_AMBULATORY_CARE_PROVIDER_SITE_OTHER): Payer: No Typology Code available for payment source

## 2022-02-01 ENCOUNTER — Ambulatory Visit: Payer: No Typology Code available for payment source | Admitting: Family Medicine

## 2022-02-01 DIAGNOSIS — R748 Abnormal levels of other serum enzymes: Secondary | ICD-10-CM | POA: Diagnosis not present

## 2022-02-01 LAB — HEPATIC FUNCTION PANEL
ALT: 479 U/L — ABNORMAL HIGH (ref 0–35)
AST: 237 U/L — ABNORMAL HIGH (ref 0–37)
Albumin: 4.1 g/dL (ref 3.5–5.2)
Alkaline Phosphatase: 88 U/L (ref 39–117)
Bilirubin, Direct: 0.2 mg/dL (ref 0.0–0.3)
Total Bilirubin: 0.8 mg/dL (ref 0.2–1.2)
Total Protein: 6 g/dL (ref 6.0–8.3)

## 2022-02-01 NOTE — Addendum Note (Signed)
Addended by: Mervin Kung A on: 02/01/2022 07:50 AM   Modules accepted: Orders

## 2022-02-05 ENCOUNTER — Other Ambulatory Visit (HOSPITAL_BASED_OUTPATIENT_CLINIC_OR_DEPARTMENT_OTHER): Payer: Self-pay

## 2022-02-06 ENCOUNTER — Ambulatory Visit (INDEPENDENT_AMBULATORY_CARE_PROVIDER_SITE_OTHER): Payer: No Typology Code available for payment source | Admitting: Psychology

## 2022-02-06 DIAGNOSIS — F4323 Adjustment disorder with mixed anxiety and depressed mood: Secondary | ICD-10-CM | POA: Diagnosis not present
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
## 2022-02-08 ENCOUNTER — Other Ambulatory Visit: Payer: Self-pay | Admitting: Family Medicine

## 2022-02-08 ENCOUNTER — Other Ambulatory Visit (HOSPITAL_BASED_OUTPATIENT_CLINIC_OR_DEPARTMENT_OTHER): Payer: Self-pay

## 2022-02-08 ENCOUNTER — Ambulatory Visit (INDEPENDENT_AMBULATORY_CARE_PROVIDER_SITE_OTHER): Payer: No Typology Code available for payment source | Admitting: Family Medicine

## 2022-02-08 ENCOUNTER — Other Ambulatory Visit (HOSPITAL_COMMUNITY)
Admission: RE | Admit: 2022-02-08 | Discharge: 2022-02-08 | Disposition: A | Discharge status: Home / Self Care | Payer: No Typology Code available for payment source | Source: Ambulatory Visit | Attending: Family Medicine | Admitting: Family Medicine

## 2022-02-08 ENCOUNTER — Encounter: Payer: Self-pay | Admitting: Family Medicine

## 2022-02-08 VITALS — BP 100/63 | HR 117 | Temp 99.2°F | Ht 62.0 in | Wt 146.0 lb

## 2022-02-08 DIAGNOSIS — R109 Unspecified abdominal pain: Secondary | ICD-10-CM | POA: Insufficient documentation

## 2022-02-08 DIAGNOSIS — R748 Abnormal levels of other serum enzymes: Secondary | ICD-10-CM | POA: Insufficient documentation

## 2022-02-08 DIAGNOSIS — K319 Disease of stomach and duodenum, unspecified: Secondary | ICD-10-CM | POA: Diagnosis not present

## 2022-02-08 DIAGNOSIS — R1011 Right upper quadrant pain: Secondary | ICD-10-CM

## 2022-02-08 LAB — COMPREHENSIVE METABOLIC PANEL
ALT: 54 U/L — ABNORMAL HIGH (ref 0–35)
AST: 17 U/L (ref 0–37)
Albumin: 3.5 g/dL (ref 3.5–5.2)
Alkaline Phosphatase: 113 U/L (ref 39–117)
BUN: 8 mg/dL (ref 6–23)
CO2: 24 mEq/L (ref 19–32)
Calcium: 8.2 mg/dL — ABNORMAL LOW (ref 8.4–10.5)
Chloride: 101 mEq/L (ref 96–112)
Creatinine, Ser: 0.89 mg/dL (ref 0.40–1.20)
GFR: 89.73 mL/min (ref >60.00)
Glucose, Bld: 97 mg/dL (ref 70–99)
Potassium: 3.7 mEq/L (ref 3.5–5.1)
Sodium: 135 mEq/L (ref 135–145)
Total Bilirubin: 0.7 mg/dL (ref 0.2–1.2)
Total Protein: 5.8 g/dL — ABNORMAL LOW (ref 6.0–8.3)

## 2022-02-08 LAB — CBC WITH DIFFERENTIAL/PLATELET
Basophils Absolute: 0 10*3/uL (ref 0.0–0.1)
Basophils Relative: 0.2 % (ref 0.0–3.0)
Eosinophils Absolute: 0 10*3/uL (ref 0.0–0.7)
Eosinophils Relative: 0.2 % (ref 0.0–5.0)
HCT: 34.4 % — ABNORMAL LOW (ref 36.0–46.0)
Hemoglobin: 11.9 g/dL — ABNORMAL LOW (ref 12.0–15.0)
Lymphocytes Relative: 5.4 % — ABNORMAL LOW (ref 12.0–46.0)
Lymphs Abs: 0.5 10*3/uL — ABNORMAL LOW (ref 0.7–4.0)
MCHC: 34.5 g/dL (ref 30.0–36.0)
MCV: 95.2 fl (ref 78.0–100.0)
Monocytes Absolute: 1.2 10*3/uL — ABNORMAL HIGH (ref 0.1–1.0)
Monocytes Relative: 11.4 % (ref 3.0–12.0)
Neutro Abs: 8.5 10*3/uL — ABNORMAL HIGH (ref 1.4–7.7)
Neutrophils Relative %: 82.8 % — ABNORMAL HIGH (ref 43.0–77.0)
Platelets: 147 10*3/uL — ABNORMAL LOW (ref 150.0–400.0)
RBC: 3.62 Mil/uL — ABNORMAL LOW (ref 3.87–5.11)
RDW: 12.7 % (ref 11.5–15.5)
WBC: 10.2 10*3/uL (ref 4.0–10.5)

## 2022-02-08 LAB — POC URINALSYSI DIPSTICK (AUTOMATED)
Bilirubin, UA: NEGATIVE
Glucose, UA: NEGATIVE
Ketones, UA: POSITIVE
Nitrite, UA: NEGATIVE
Protein, UA: POSITIVE — AB
Spec Grav, UA: 1.015 (ref 1.010–1.025)
Urobilinogen, UA: 1 E.U./dL
pH, UA: 6 (ref 5.0–8.0)

## 2022-02-08 LAB — AMYLASE: Amylase: 16 U/L — ABNORMAL LOW (ref 27–131)

## 2022-02-08 LAB — SEDIMENTATION RATE: Sed Rate: 55 mm/hr — ABNORMAL HIGH (ref 0–20)

## 2022-02-08 LAB — C-REACTIVE PROTEIN: CRP: 30.9 mg/dL — ABNORMAL HIGH (ref 0.5–20.0)

## 2022-02-08 LAB — LIPASE: Lipase: 14 U/L (ref 11.0–59.0)

## 2022-02-08 MED ORDER — ONDANSETRON HCL 4 MG/2ML IJ SOLN
4.0000 mg | Freq: Once | INTRAMUSCULAR | Status: AC
  Administered 2022-02-08: 4 mg via INTRAMUSCULAR

## 2022-02-08 MED ORDER — TRAMADOL HCL 50 MG PO TABS: 100.0000 mg | ORAL_TABLET | Freq: Three times a day (TID) | ORAL | 0 refills | Status: AC | PRN

## 2022-02-08 MED ORDER — CIPROFLOXACIN HCL 500 MG PO TABS: 500.0000 mg | ORAL_TABLET | Freq: Two times a day (BID) | ORAL | 0 refills | Status: AC

## 2022-02-08 MED ORDER — METRONIDAZOLE 500 MG PO TABS: 500.0000 mg | ORAL_TABLET | Freq: Three times a day (TID) | ORAL | 0 refills | Status: AC

## 2022-02-08 MED ORDER — PANTOPRAZOLE SODIUM 40 MG PO TBEC: 40.0000 mg | DELAYED_RELEASE_TABLET | Freq: Every day | ORAL | 3 refills | Status: DC

## 2022-02-08 MED ORDER — KETOROLAC TROMETHAMINE 60 MG/2ML IM SOLN
60.0000 mg | Freq: Once | INTRAMUSCULAR | Status: AC
  Administered 2022-02-08: 60 mg via INTRAMUSCULAR

## 2022-02-08 MED ORDER — SUCRALFATE 1 G PO TABS: 1.0000 g | ORAL_TABLET | Freq: Three times a day (TID) | ORAL | 0 refills | Status: DC

## 2022-02-08 NOTE — Progress Notes (Signed)
Barbara Lutz , 1995-12-27, 26 y.o., female MRN: [id number] Patient Care Team    Relationship Specialty Notifications Start End  Terrilyn Saver, NP PCP - General Family Medicine  09/06/21     Chief Complaint  Patient presents with   Flank Pain    This is a chronic constant problem that started about 2 mos ago and has worsen in the last 9 days     Subjective: Pt presents for an OV with complaints of significant abdominal pain.  Patient reports she has had abdominal pain off and on over the last 2 months.  The pain has worsened over the last 1 to 2 weeks.  Pain is present in the right upper quadrant of her abdomen.  She reports it will feel like stabbing pain at times and throbbing pain.  She reports the pain will be worse when she changes positions or coughs.  She has had intermittent fevers at night up to 101 F.  He endorses chills, night sweats and feeling very fatigued and weak. She was recently seen in the emergency room and was noted to have very mild AKI with a creatinine of 1.04, elevated liver enzymes with an AST greater than 1200 and ALT 817.  Total bilirubin was elevated at 2.2.  CBC was normal with the exception of a mild elevation in her neutrophils.  Urinalysis showed a small amount of bilirubin, ketones, leukocytes, nitrites and protein.  Culture had not been sent. She has tried NSAIDs, analgesics, heat and bed rest for the symptoms with only any relief in her symptoms.  Abdominal CT was unremarkable with the exception of mildly enlarged proximal duodenum.  Patient has had repeat liver enzymes which show improvement with him trending down on 8/18 with an AST of 237 and ALT of 479. She has been evaluated by gastroenterology yesterday with a negative work-up for hepatitis and autoimmune disorders.  She has been evaluated by urology who did not feel her symptoms were coming from her KUB. Has been prescribed Mancel Parsons has lost a great deal of weight and she is happy with her weight  loss presently.  She has been tapering off the [facility].  Last injection approximately 2-3 weeks ago.  IMPRESSION: 1. Upper limit of normal caliber of the proximal duodenum, which may be transient in nature. Correlation with follow-up abdomen pelvis CT is recommended if clinical symptoms persist or an early proximal small bowel obstruction is of clinical concern.      01/11/2022    8:15 AM 12/14/2021   11:19 AM 10/19/2021    8:44 AM 09/06/2021    4:45 PM 07/04/2021    3:57 PM  Depression screen PHQ 2/9  Decreased Interest '1 2 1 1 ' 0  Down, Depressed, Hopeless '1 2 2 1 1  ' PHQ - 2 Score '2 4 3 2 1  ' Altered sleeping 0 '3 1 2   ' Tired, decreased energy '2 3 2 1   ' Change in appetite 0 1 0 0   Feeling bad or failure about yourself  0 2 1    Trouble concentrating 1  0 0   Moving slowly or fidgety/restless 0 1 0 0   Suicidal thoughts 0 2 0 0   PHQ-9 Score '5 16 7 5   ' Difficult doing work/chores Somewhat difficult Very difficult Somewhat difficult Somewhat difficult     Allergies  Allergen Reactions   Latex Rash   Nickel Dermatitis   Social History   Social History Narrative  Marital status/children/pets: Single.   Education/employment: GED.  Employed. Manager.   Safety:      -smoke alarm in the home:Yes     - wears seatbelt: Yes     - Feels safe in their relationships: Yes   Past Medical History:  Diagnosis Date   Anxiety    COVID-19    Depression    Depression, major, recurrent, moderate ([place]) 05/25/2012   History of pyelonephritis 07/23/2018   Mucocele of lip 01/2020   Obesity (BMI 30-39.9) 07/23/2018   [facility]   Pityriasis versicolor 03/19/2021   Recurrent UTI 07/23/2018   Thyroid disorder    Past Surgical History:  Procedure Laterality Date   ORAL MUCOCELE EXCISION     Family History  Problem Relation Age of Onset   COPD Mother    Hypertension Mother    Heart attack Father    High Cholesterol Father    Depression Sister    Diabetes Maternal  Grandfather    Hearing loss Maternal Grandfather    Heart attack Maternal Grandfather    Heart disease Maternal Grandfather    Hypertension Maternal Grandfather    Hyperlipidemia Maternal Grandfather    ADD / ADHD Paternal Grandfather    Alcohol abuse Paternal Grandfather    COPD Paternal Grandfather    Diabetes Paternal Grandfather    Hearing loss Paternal Grandfather    Hyperlipidemia Paternal Grandfather    Hypertension Paternal Grandfather    Stroke Paternal Grandfather    Asthma Sister    Depression Sister    Drug abuse Sister    Breast cancer Maternal Grandmother    Pancreatic cancer Paternal Grandmother    Arthritis Paternal Grandmother    Hypertension Paternal Grandmother    Allergies as of 02/08/2022       Reactions   Latex Rash   Nickel Dermatitis        Medication List        Accurate as of February 08, 2022 12:22 PM. If you have any questions, ask your nurse or doctor.          STOP taking these medications    Wegovy 0.5 MG/0.5ML Soaj Generic drug: Semaglutide-Weight Management Stopped by: Howard Pouch, DO       TAKE these medications    ciprofloxacin 500 MG tablet Commonly known as: Cipro Take 1 tablet (500 mg total) by mouth 2 (two) times daily for 14 days. Started by: Howard Pouch, DO   citalopram 40 MG tablet Commonly known as: CELEXA Take 1 tablet (40 mg total) by mouth daily.   metroNIDAZOLE 500 MG tablet Commonly known as: FLAGYL Take 1 tablet (500 mg total) by mouth 3 (three) times daily for 14 days. Started by: Howard Pouch, DO   pantoprazole 40 MG tablet Commonly known as: PROTONIX Take 1 tablet (40 mg total) by mouth daily. Started by: Howard Pouch, DO   sucralfate 1 g tablet Commonly known as: Carafate Take 1 tablet (1 g total) by mouth 4 (four) times daily -  with meals and at bedtime. Started by: Howard Pouch, DO   traMADol 50 MG tablet Commonly known as: ULTRAM Take 2 tablets (100 mg total) by mouth every 8 (eight)  hours as needed for up to 7 days. Started by: Howard Pouch, DO        All past medical history, surgical history, allergies, family history, immunizations andmedications were updated in the EMR today and reviewed under the history and medication portions of their EMR.  ROS Negative, with the exception of above mentioned in HPI   Objective:  BP 100/63   Pulse (!) 117   Temp 99.2 F (37.3 C) (Oral)   Ht '5\' 2"'  (1.575 m)   Wt 146 lb (66.2 kg)   LMP 01/27/2022 (Exact Date)   SpO2 100%   BMI 26.70 kg/m  Body mass index is 26.7 kg/m. Physical Exam Vitals and nursing note reviewed.  Constitutional:      General: She is not in acute distress.    Appearance: Normal appearance. She is normal weight. She is not ill-appearing, toxic-appearing or diaphoretic.  HENT:     Head: Normocephalic and atraumatic.  Eyes:     General: No scleral icterus.       Right eye: No discharge.        Left eye: No discharge.     Extraocular Movements: Extraocular movements intact.     Conjunctiva/sclera: Conjunctivae normal.     Pupils: Pupils are equal, round, and reactive to light.  Cardiovascular:     Rate and Rhythm: Tachycardia present.  Pulmonary:     Effort: Pulmonary effort is normal. No respiratory distress.  Abdominal:     General: Abdomen is flat. There is no distension.     Palpations: Abdomen is soft.     Tenderness: There is abdominal tenderness. There is guarding. There is no right CVA tenderness or left CVA tenderness.  Musculoskeletal:     Right lower leg: No edema.     Left lower leg: No edema.  Skin:    Findings: No rash.  Neurological:     Mental Status: She is alert and oriented to person, place, and time. Mental status is at baseline.  Psychiatric:        Mood and Affect: Mood normal.        Behavior: Behavior normal.        Thought Content: Thought content normal.        Judgment: Judgment normal.     No results found. No results found. Results for orders  placed or performed in visit on 02/08/22 (from the past 24 hour(s))  POCT Urinalysis Dipstick (Automated)     Status: Abnormal   Collection Time: 02/08/22 11:44 AM  Result Value Ref Range   Color, UA amber    Clarity, UA cloudy    Glucose, UA Negative Negative   Bilirubin, UA neg    Ketones, UA pos    Spec Grav, UA 1.015 1.010 - 1.025   Blood, UA 2+    pH, UA 6.0 5.0 - 8.0   Protein, UA Positive (A) Negative   Urobilinogen, UA 1.0 0.2 or 1.0 E.U./dL   Nitrite, UA neg    Leukocytes, UA Small (1+) (A) Negative    Assessment/Plan: Hudsyn Barich is a 26 y.o. female present for OV for  Flank pain/abnormal urine/nausea - POCT Urinalysis Dipstick (Automated) - Urinalysis w microscopic + reflex cultur - Cytology - non gyn - CBC w/Diff - Comp Met (CMET) - Lipase - Bilirubin, fractionated(tot/dir/indir) - Amylase - C-reactive protein - Sedimentation rate - ketorolac (TORADOL) injection 60 mg - ondansetron (ZOFRAN) injection 4 mg  Right upper quadrant pain/Duodenum disorder/Elevated liver enzymes Suspect GLP-1 side effect of Wegovy long-term use greater than 6 months/high-dose.  On CT, duodenum appeared enlarged in comparison to her prior studies.  Enlargement possibly creating intermittent duodenal obstruction, which led to cholangitis. She continues to have fever.  Low-grade fever today despite NSAID use. Pain seems to be rather significant  with guarding> Toradol injection provided today along with Zofran injection for nausea. Repeat labs today of CBC, CMP, fractionated bili, sed rate, CRP, lipase and amylase collected today. Elected to start treatment with Cipro/Flagyl 14-day course for now, may be able to discontinue after 10 days. Also elected to start Protonix and Carafate to assist with any possible duodenal irritation and healing. Tramadol short course for acute pain only. Would encourage her not to restart Wegovy at this time.  She has been tapering off anyway.  And has lost  a great deal of weight and is happy with her weight loss outcome. Could consider repeat CT after treatment versus discussing ERCP with her gastroenterologist if felt warranted. F/u 1 week will be arranged on lab call back  Reviewed expectations re: course of current medical issues. Discussed self-management of symptoms. Outlined signs and symptoms indicating need for more acute intervention. Patient verbalized understanding and all questions were answered. Patient received an After-Visit Summary.    Orders Placed This Encounter  Procedures   Urinalysis w microscopic + reflex cultur   CBC w/Diff   Comp Met (CMET)   Lipase   Bilirubin, fractionated(tot/dir/indir)   Amylase   C-reactive protein   Sedimentation rate   POCT Urinalysis Dipstick (Automated)   Meds ordered this encounter  Medications   pantoprazole (PROTONIX) 40 MG tablet    Sig: Take 1 tablet (40 mg total) by mouth daily.    Dispense:  30 tablet    Refill:  3   sucralfate (CARAFATE) 1 g tablet    Sig: Take 1 tablet (1 g total) by mouth 4 (four) times daily -  with meals and at bedtime.    Dispense:  120 tablet    Refill:  0   metroNIDAZOLE (FLAGYL) 500 MG tablet    Sig: Take 1 tablet (500 mg total) by mouth 3 (three) times daily for 14 days.    Dispense:  42 tablet    Refill:  0   ciprofloxacin (CIPRO) 500 MG tablet    Sig: Take 1 tablet (500 mg total) by mouth 2 (two) times daily for 14 days.    Dispense:  28 tablet    Refill:  0   traMADol (ULTRAM) 50 MG tablet    Sig: Take 2 tablets (100 mg total) by mouth every 8 (eight) hours as needed for up to 7 days.    Dispense:  42 tablet    Refill:  0   ketorolac (TORADOL) injection 60 mg   ondansetron (ZOFRAN) injection 4 mg   Referral Orders  No referral(s) requested today   54 minutes spent face-to-face with patient reviewing ED visit, GI visit, labs and imaging studies as well obtaining a detailed history.  Lengthy conversation discussing anatomy/physiology  and mechanism of action of medications with her today.  Note is dictated utilizing voice recognition software. Although note has been proof read prior to signing, occasional typographical errors still can be missed. If any questions arise, please do not hesitate to call for verification.   electronically signed by:  Howard Pouch, DO  [place]

## 2022-02-08 NOTE — Patient Instructions (Signed)
Duodenitis  Duodenitis is inflammation of the lining of the first part of the small intestine (duodenum). It is commonly caused by an infection from bacteria, which may also lead to open sores (ulcers) in the intestine. Duodenitis may develop suddenly and last for a short time (acute), or it may develop gradually and last for months or years (chronic). What are the causes? The most common cause of duodenitis is an infection from a type of bacteria called Helicobacter pylori (H. pylori). Other causes of this condition include: Long-term use of NSAIDs. Excessive use of alcohol. An infection of the small intestine caused by the Giardia parasite (giardiasis). Crohn's disease. Certain diseases of the body's defense system (immune system). Certain treatments for cancer. What increases the risk? The following factors may make you more likely to develop this condition: Smoking cigarettes. Drinking alcohol. Having a family history of duodenitis. Taking NSAIDs. Eating a high-fat diet. What are the signs or symptoms? Symptoms of this condition may include: Gnawing or burning pain in the upper center of the abdomen (epigastric pain). This may get worse when the stomach is empty and may get better after eating. Abdominal cramps. Nausea and vomiting. Bloody vomit. Stools that are bloody, dark, or look like tar. Diarrhea. Weight loss. Fatigue. How is this diagnosed? This condition may be diagnosed based on your medical history and a physical exam. You may also have tests, such as: Blood tests. Stool tests. A test that checks the gases in your breath. An X-ray that is done after you swallow a liquid (barium) that makes your digestive tract easier to see. Endoscopy. This is an exam of the duodenum that is done by putting a thin tube with a tiny camera on the end (endoscope) down your throat. A sample of tissue from your duodenum (biopsy) may be removed with the endoscope and examined under a  microscope for signs of inflammation and infection. How is this treated? Treatment depends on the cause of your condition. Treatment may include: Antibiotic medicine to treat H. pylori infection. Stopping your intake of NSAIDs. Medicine to reduce stomach acids. Medicines to treat other conditions, such as Crohn's disease or giardiasis. Surgery to treat severe inflammation that causes scarring or severe bleeding. Follow these instructions at home: Medicines Take over-the-counter and prescription medicines only as told by your health care provider. If you were prescribed an antibiotic medicine, take it as told by your health care provider. Do not stop taking the antibiotic even if you start to feel better. Eating and drinking  Eat small, frequent meals. Do not drink alcohol. Drink enough water to keep your urine pale yellow. Follow instructions from your health care provider about eating or drinking restrictions. You may be asked to avoid: Caffeinated drinks. Chocolate. Peppermint or mint-flavored food or drinks. Garlic or onions. Spicy foods. Citrus fruits. Tomato-based foods. Fatty or fried foods. General instructions Do not use any products that contain nicotine or tobacco, such as cigarettes and e-cigarettes. If you need help quitting, ask your health care provider. Keep all follow-up visits as told by your health care provider. This is important. Contact a health care provider if: You have a fever. Your symptoms come back, get worse, or do not get better with treatment. Get help right away if: You vomit blood. You have severe abdominal pain. Your abdomen swells and is painful. You have a lot of blood in your stool. You feel dizzy or light-headed. Summary Duodenitis is inflammation of the lining of the first part of the small  intestine. This part of the small intestine is called the duodenum. Duodenitis may develop suddenly and last for a short time (acute), or it may  develop gradually and last longer (chronic). The most common cause of duodenitis is an infection from a type of bacteria. Take over-the-counter and prescription medicines only as told by your health care provider. This information is not intended to replace advice given to you by your health care provider. Make sure you discuss any questions you have with your health care provider. Document Revised: 12/12/2020 Document Reviewed: 12/13/2020 Elsevier Patient Education  2023 [employer].

## 2022-02-09 LAB — BILIRUBIN, FRACTIONATED(TOT/DIR/INDIR)
Bilirubin, Direct: 0.3 mg/dL — ABNORMAL HIGH (ref 0.0–0.2)
Indirect Bilirubin: 0.4 mg/dL (calc) (ref 0.2–1.2)
Total Bilirubin: 0.7 mg/dL (ref 0.2–1.2)

## 2022-02-11 ENCOUNTER — Telehealth: Payer: Self-pay | Admitting: Family Medicine

## 2022-02-11 LAB — CULTURE INDICATED

## 2022-02-11 LAB — URINALYSIS W MICROSCOPIC + REFLEX CULTURE
Bilirubin Urine: NEGATIVE
Glucose, UA: NEGATIVE
Hyaline Cast: NONE SEEN /LPF
Nitrites, Initial: NEGATIVE
RBC / HPF: NONE SEEN /HPF (ref 0–2)
Specific Gravity, Urine: 1.017 (ref 1.001–1.035)
pH: 6.5 (ref 5.0–8.0)

## 2022-02-11 LAB — URINE CULTURE
MICRO NUMBER:: 13836038
SPECIMEN QUALITY:: ADEQUATE

## 2022-02-11 NOTE — Telephone Encounter (Signed)
Please advise 

## 2022-02-11 NOTE — Telephone Encounter (Signed)
Barbara Lutz called stating that a sample for urine was provided but they need an order put in to test for Gonorrhea and Chlamydia

## 2022-02-11 NOTE — Telephone Encounter (Signed)
This order was placed during her visit.

## 2022-02-11 NOTE — Telephone Encounter (Signed)
Please inform pt she did have a UTI. The cipro will treat it appropriately.

## 2022-02-11 NOTE — Telephone Encounter (Signed)
.  gecl

## 2022-02-11 NOTE — Telephone Encounter (Signed)
Error in last message.   LVM for pt to CB regarding results.

## 2022-02-11 NOTE — Telephone Encounter (Signed)
Ordered/printed

## 2022-02-11 NOTE — Telephone Encounter (Signed)
Please call patient: Her liver enzymes have almost returned to normal.. Her kidney function has returned to normal. Her bilirubin is just mildly elevated, but better than on last week in the ED. She has become just mildly anemic, which makes me wonder if she has a gastric ulcer (and less may be she is on her menses when we took blood). Her platelets have decreased a little from her normal, not concerning at this level but something to watch.   Her calcium is also decreased, this is a reflection of her acute illness.  Her white blood cell total is normal, however she does have mildly elevated neutrophils suggesting a bacterial component to her infection. Urine was mildly contaminated with squamous cells, but it does look like it may have been infected, culture still pending.  Both of the inflammatory markers are elevated. I would like to see her back within the next 2 to 3 days for follow-up if she is seeing improvement.  If she is not seeing any improvement or worsening I like to see her back today or tomorrow.

## 2022-02-12 ENCOUNTER — Telehealth: Payer: Self-pay | Admitting: Family Medicine

## 2022-02-12 DIAGNOSIS — R8289 Other abnormal findings on cytological and histological examination of urine: Secondary | ICD-10-CM | POA: Insufficient documentation

## 2022-02-12 LAB — MOLECULAR ANCILLARY ONLY
Chlamydia: NEGATIVE
Comment: NEGATIVE
Comment: NORMAL
Neisseria Gonorrhea: NEGATIVE

## 2022-02-12 LAB — CYTOLOGY - NON PAP

## 2022-02-12 NOTE — Telephone Encounter (Signed)
Spoke with pt regarding labs and instructions.   

## 2022-02-12 NOTE — Telephone Encounter (Signed)
Please inform patient the following information: Urine cytology for gonorrhea and chlamydia were negative.  The cytology for atypical cells showed: an atypical urothelial cell aggregates with mildly increased  chromaticity with small nucleoli and mildly irregular nuclear membranes.   This means there are cells that appear mildly degenerated, they also comment on mildly irregular nuclear membranes-which can be a urology concern.  I urged her to discuss this with her urology team and also inform them that she had an E. coli infection as well during this collection.   Please forward these results to her urology team.

## 2022-02-13 ENCOUNTER — Telehealth: Payer: Self-pay

## 2022-02-13 NOTE — Telephone Encounter (Signed)
FMLA paperwork received and placed on Dr. Alan Ripper desk.

## 2022-02-13 NOTE — Telephone Encounter (Signed)
Spoke with pt regarding labs and instructions.   

## 2022-02-14 NOTE — Telephone Encounter (Signed)
Faxed to Dr. Haywood Pao office.

## 2022-02-14 NOTE — Telephone Encounter (Signed)
Message left by Alveta Heimlich to call office

## 2022-02-14 NOTE — Telephone Encounter (Signed)
Pt informed of provider's recommendations.  ?

## 2022-02-14 NOTE — Telephone Encounter (Signed)
His FMLA paperwork has Dr. Lennie Odor name on it. I believe they are the office to complete the paperwork for her.

## 2022-03-06 ENCOUNTER — Encounter: Payer: Self-pay | Admitting: Family Medicine

## 2022-03-06 ENCOUNTER — Ambulatory Visit (INDEPENDENT_AMBULATORY_CARE_PROVIDER_SITE_OTHER): Payer: No Typology Code available for payment source

## 2022-03-06 DIAGNOSIS — Z23 Encounter for immunization: Secondary | ICD-10-CM

## 2022-03-07 ENCOUNTER — Other Ambulatory Visit: Payer: Self-pay | Admitting: Family Medicine

## 2022-03-12 ENCOUNTER — Encounter: Payer: Self-pay | Admitting: General Practice

## 2022-03-12 ENCOUNTER — Ambulatory Visit: Payer: No Typology Code available for payment source | Admitting: Advanced Practice Midwife

## 2022-03-12 ENCOUNTER — Encounter: Payer: Self-pay | Admitting: Advanced Practice Midwife

## 2022-03-12 VITALS — BP 111/69 | HR 66 | Ht 62.0 in | Wt 157.0 lb

## 2022-03-12 DIAGNOSIS — Z30430 Encounter for insertion of intrauterine contraceptive device: Secondary | ICD-10-CM

## 2022-03-12 DIAGNOSIS — Z01812 Encounter for preprocedural laboratory examination: Secondary | ICD-10-CM

## 2022-03-12 DIAGNOSIS — N946 Dysmenorrhea, unspecified: Secondary | ICD-10-CM | POA: Insufficient documentation

## 2022-03-12 DIAGNOSIS — Z975 Presence of (intrauterine) contraceptive device: Secondary | ICD-10-CM | POA: Insufficient documentation

## 2022-03-12 DIAGNOSIS — Z01419 Encounter for gynecological examination (general) (routine) without abnormal findings: Secondary | ICD-10-CM | POA: Diagnosis not present

## 2022-03-12 HISTORY — DX: Dysmenorrhea, unspecified: N94.6

## 2022-03-12 LAB — POCT URINE PREGNANCY: Preg Test, Ur: NEGATIVE

## 2022-03-12 MED ORDER — LEVONORGESTREL 20 MCG/DAY IU IUD
1.0000 | INTRAUTERINE_SYSTEM | Freq: Once | INTRAUTERINE | Status: AC
  Administered 2022-03-12: 1 via INTRAUTERINE

## 2022-03-12 NOTE — Progress Notes (Signed)
GYNECOLOGY ANNUAL PREVENTATIVE CARE ENCOUNTER NOTE  Subjective:   Barbara Lutz is a 26 y.o. G0P0000 female here for a routine annual gynecologic exam.  Current complaints:  Severe Dysmenorrhea.  Formerly on OCPs and Depo but did not like them  Just lost a lot of weight on Wegovy and does not want hormonal influence on weight gain.  .  Interested in Burns IUD.  Tried to get pregnant years ago for 4 yrs without success. States thinks she has PCOS (hirsutism, formerly obese), though has regular cycles..   Not currently in a relationship, but wants children in future.   Denies abnormal vaginal bleeding, discharge, pelvic pain, problems with intercourse or other gynecologic concerns.    Gynecologic History Patient's last menstrual period was 03/06/2022 (exact date). Contraception: IUD Last Pap: 06/02/20. Results were: normal   Obstetric History OB History  Gravida Para Term Preterm AB Living  0 0 0 0 0 0  SAB IAB Ectopic Multiple Live Births  0 0 0 0 0    Past Medical History:  Diagnosis Date   Anxiety    COVID-19    Depression    Depression, major, recurrent, moderate ([place]) 05/25/2012   History of pyelonephritis 07/23/2018   Mucocele of lip 01/2020   Obesity (BMI 30-39.9) 07/23/2018   [facility]   Pityriasis versicolor 03/19/2021   Recurrent UTI 07/23/2018   Thyroid disorder     Past Surgical History:  Procedure Laterality Date   ORAL MUCOCELE EXCISION      Current Outpatient Medications on File Prior to Visit  Medication Sig Dispense Refill   citalopram (CELEXA) 40 MG tablet Take 1 tablet (40 mg total) by mouth daily. 90 tablet 0   pantoprazole (PROTONIX) 40 MG tablet Take 1 tablet (40 mg total) by mouth daily. 30 tablet 3   sucralfate (CARAFATE) 1 g tablet Take 1 tablet (1 g total) by mouth 4 (four) times daily -  with meals and at bedtime. 120 tablet 0   No current facility-administered medications on file prior to visit.    Allergies  Allergen  Reactions   Latex Rash   Nickel Dermatitis    Social History   Socioeconomic History   Marital status: Single    Spouse name: Not on file   Number of children: Not on file   Years of education: Not on file   Highest education level: Not on file  Occupational History   Not on file  Tobacco Use   Smoking status: Some Days    Packs/day: 0.00    Types: Cigarettes   Smokeless tobacco: Never  Vaping Use   Vaping Use: Every day  Substance and Sexual Activity   Alcohol use: Yes    Comment: social   Drug use: No   Sexual activity: Not on file  Other Topics Concern   Not on file  Social History Narrative   Marital status/children/pets: Single.   Education/employment: GED.  Employed. Manager.   Safety:      -smoke alarm in the home:Yes     - wears seatbelt: Yes     - Feels safe in their relationships: Yes   Social Determinants of Health   Financial Resource Strain: Not on file  Food Insecurity: Not on file  Transportation Needs: Not on file  Physical Activity: Not on file  Stress: Not on file  Social Connections: Not on file  Intimate Partner Violence: Not on file    Family History  Problem Relation Age of Onset  COPD Mother    Hypertension Mother    Heart attack Father    High Cholesterol Father    Depression Sister    Diabetes Maternal Grandfather    Hearing loss Maternal Grandfather    Heart attack Maternal Grandfather    Heart disease Maternal Grandfather    Hypertension Maternal Grandfather    Hyperlipidemia Maternal Grandfather    ADD / ADHD Paternal Grandfather    Alcohol abuse Paternal Grandfather    COPD Paternal Grandfather    Diabetes Paternal Grandfather    Hearing loss Paternal Grandfather    Hyperlipidemia Paternal Grandfather    Hypertension Paternal Grandfather    Stroke Paternal Grandfather    Asthma Sister    Depression Sister    Drug abuse Sister    Breast cancer Maternal Grandmother    Pancreatic cancer Paternal Grandmother     Arthritis Paternal Grandmother    Hypertension Paternal Grandmother     The following portions of the patient's history were reviewed and updated as appropriate: allergies, current medications, past family history, past medical history, past social history, past surgical history and problem list.  Review of Systems Pertinent items noted in HPI and remainder of comprehensive ROS otherwise negative.   Objective:  BP 111/69   Pulse 66   Ht 5\' 2"  (1.575 m)   Wt 157 lb (71.2 kg)   LMP 03/06/2022 (Exact Date)   BMI 28.72 kg/m  CONSTITUTIONAL: Well-developed, well-nourished female in no acute distress.  EYES: Conjunctivae and EOM are normal.   NECK: Normal range of motion, supple, no masses.  Normal thyroid.  SKIN: Skin is warm and dry. No rash noted. Not diaphoretic. No erythema. No pallor. NEUROLOGIC: Alert and oriented to person, place, and time. Normal reflexes, muscle tone coordination. No cranial nerve deficit noted. PSYCHIATRIC: Normal mood and affect. Normal behavior. Normal judgment and thought content. CARDIOVASCULAR: Normal heart rate noted, regular rhythm RESPIRATORY:  Effort and breath sounds normal, no problems with respiration noted. BREASTS: Symmetric in size. No masses, skin changes, nipple drainage, or lymphadenopathy. ABDOMEN: Soft, normal bowel sounds, no distention noted.  No tenderness, rebound or guarding.  PELVIC: Normal appearing external genitalia; normal appearing vaginal mucosa and cervix.  No abnormal discharge noted.  Normal uterine size, no other palpable masses, no uterine or adnexal tenderness. MUSCULOSKELETAL: Normal range of motion. No tenderness.  No cyanosis, clubbing, or edema.  2+ distal pulses.  Has had recent imaging including CT and Ultrasound Both were negative for pelvic pathology Most recent CT showed a physiologic right simple cyst (1.4cm) Urine was negative for GC/Chlamydia UPT negative, currently abstinent  MIRENA IUD INSERTION Patient  identified, informed consent performed, signed copy in chart, time out was performed.  Urine pregnancy test negative.  Speculum placed in the vagina.  Cervix visualized.  Cleaned with Betadine x 2.  Lidocaine injected into anterior cervix then cervix was Grasped anteriourly with a single tooth tenaculum.  Lidocaine injected at 3 and 9 oclock on cervix.   Uterus sounded to 5cm.  Some difficulty getting into internal os.  Mirena IUD placed per manufacturer's recommendations.  Strings trimmed to 3 cm.   Patient given post procedure instructions and Mirena care card with expiration date.  Patient is asked to check IUD strings periodically and follow up in 4-6 weeks for IUD check.  If she has cramping more than a day or so, will offer 03/08/2022 for IUD placement  Assessment:  Annual gynecologic examination  Dysmenorrhea Mirena IUD insertion   Plan:  Routine preventative health maintenance measures emphasized. String check in 1 month [place] if prolonged cramping Please refer to After Visit Summary for other counseling recommendations.

## 2022-03-13 ENCOUNTER — Ambulatory Visit (INDEPENDENT_AMBULATORY_CARE_PROVIDER_SITE_OTHER): Payer: No Typology Code available for payment source | Admitting: Psychology

## 2022-03-13 DIAGNOSIS — F4323 Adjustment disorder with mixed anxiety and depressed mood: Secondary | ICD-10-CM | POA: Diagnosis not present

## 2022-03-13 NOTE — Progress Notes (Signed)
[facility] Counselor/Therapist Progress Note  Patient ID: Barbara Lutz, MRN: [id number],    Date: 03/13/2022  Time Spent: 12:00pm - 12:55pm    55 minutes   Treatment Type: Individual Therapy  Reported Symptoms: anxiety  Mental Status Exam: Appearance:  Casual     Behavior: Appropriate  Motor: Normal  Speech/Language:  Normal Rate  Affect: Appropriate  Mood: normal  Thought process: normal  Thought content:   WNL  Sensory/Perceptual disturbances:   WNL  Orientation: oriented to person, place, time/date, and situation  Attention: Good  Concentration: Good  Memory: WNL  Fund of knowledge:  Good  Insight:   Good  Judgment:  Good  Impulse Control: Good   Risk Assessment: Danger to Self:  No Self-injurious Behavior: No Danger to Others: No Duty to Warn:no Physical Aggression / Violence:No  Access to Firearms a concern: No  Gang Involvement:No   Subjective:  Pt present for face-to-face individual therapy in person.   Pt is feeling overwhelmed.  She gets her license back in a month and has a lot to do to prepare for that.   Pt has to get a new car and get a blow and go put on her car and get car insurance.   Pt has a lot of financial stress that she worries about as well.   Pt has felt very anxious bc her mind worries so much.   Pt states that her mind is always going and she thinks too much when she is alone.   Pt tends to worry about the future.   Addressed pt's worry thoughts.   Encouraged her to journal her thoughts to increase awareness of her self talk.   Pt talked about her weight and body image.  She has gained 15 lbs and feels badly about herself.   Her body image and self esteem keep her from doing things that she would like to do such as dating and social events.   Pt tends to be very hard on herself.   Pt continues to smoke weed every day to cope.  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   Identified the issues that pt wants to  work on and ranked them in level of importance to pt. She ranked them as follows: Weight / body image / self esteem Isolation Worry thoughts Anger / anxiety Smoking weed to cope and self medicate.  Provided supportive therapy.    Interventions: Cognitive Behavioral Therapy and Insight-Oriented  Diagnosis:  F43.23  Plan of Care: Recommend ongoing therapy.   Pt participated in setting treatment goals.   Pt wants to improve coping skills and cope without using weed or getting angry.   Pt wants to manage stress better.    Plan to meet every two weeks.    Treatment Plan (Treatment Plan Target Date:  02/07/2023) Client Abilities/Strengths  Pt is bright, engaging, and motivated for therapy.  Client Treatment Preferences  Individual therapy.  Client Statement of Needs  Improve copings skills and understand herself better. Improve self esteem.  Symptoms  Depressed or irritable mood. Excessive and/or unrealistic worry that is difficult to control occurring more days than not for at least 6 months about a number of events or activities. Hypervigilance (e.g., feeling constantly on edge, experiencing concentration difficulties, having trouble falling or staying asleep, exhibiting a general state of irritability). Low self-esteem. Problems Addressed  Unipolar Depression, Anxiety Goals 1. Alleviate depressive symptoms and return to previous level of effective  functioning. 2. Appropriately grieve the loss in order to normalize mood and to return to previously adaptive level of functioning. Objective Learn and implement behavioral strategies to overcome depression. Target Date: 2023-02-07 Frequency: Biweekly  Progress: 10 Modality: individual  Related Interventions Assist the client in developing skills that increase the likelihood of deriving pleasure from behavioral activation (e.g., assertiveness skills, developing an exercise plan, less internal/more external focus, increased social involvement);  reinforce success. Engage the client in "behavioral activation," increasing his/her activity level and contact with sources of reward, while identifying processes that inhibit activation. use behavioral techniques such as instruction, rehearsal, role-playing, role reversal, as needed, to facilitate activity in the client's daily life; reinforce success. 3. Develop healthy interpersonal relationships that lead to the alleviation and help prevent the relapse of depression. 4. Develop healthy thinking patterns and beliefs about self, others, and the world that lead to the alleviation and help prevent the relapse of depression. 5. Enhance ability to effectively cope with the full variety of life's worries and anxieties. 6. Learn and implement coping skills that result in a reduction of anxiety and worry, and improved daily functioning. Objective Learn and implement problem-solving strategies for realistically addressing worries. Target Date: 2023-02-07 Frequency: Biweekly  Progress: 10 Modality: individual  Related Interventions Assign the client a homework exercise in which he/she problem-solves a current problem (see Mastery of Your Anxiety and Worry: Workbook by Elenora Fender and Filbert Schilder or Generalized Anxiety Disorder by Elesa Hacker, and Filbert Schilder); review, reinforce success, and provide corrective feedback toward improvement. Teach the client problem-solving strategies involving specifically defining a problem, generating options for addressing it, evaluating the pros and cons of each option, selecting and implementing an optional action, and reevaluating and refining the action. Objective Learn and implement calming skills to reduce overall anxiety and manage anxiety symptoms. Target Date: 2023-02-07 Frequency: Biweekly  Progress: 10 Modality: individual  Related Interventions Assign the client to read about progressive muscle relaxation and other calming strategies in relevant books or treatment manuals  (e.g., Progressive Relaxation Training by Twana First; Mastery of Your Anxiety and Worry: Workbook by Earlie Counts). Assign the client homework each session in which he/she practices relaxation exercises daily, gradually applying them progressively from non-anxiety-provoking to anxiety-provoking situations; review and reinforce success while providing corrective feedback toward improvement. Teach the client calming/relaxation skills (e.g., applied relaxation, progressive muscle relaxation, cue controlled relaxation; mindful breathing; biofeedback) and how to discriminate better between relaxation and tension; teach the client how to apply these skills to his/her daily life. 7. Recognize, accept, and cope with feelings of depression. 8. Reduce overall frequency, intensity, and duration of the anxiety so that daily functioning is not impaired. 9. Resolve the core conflict that is the source of anxiety. 10. Stabilize anxiety level while increasing ability to function on a daily basis. Diagnosis F43.23 Conditions For Discharge Achievement of treatment goals and objectives   Salomon Fick, LCSW

## 2022-03-14 ENCOUNTER — Other Ambulatory Visit: Payer: Self-pay | Admitting: Advanced Practice Midwife

## 2022-04-02 ENCOUNTER — Telehealth: Payer: Self-pay | Admitting: Family Medicine

## 2022-04-02 ENCOUNTER — Ambulatory Visit (HOSPITAL_BASED_OUTPATIENT_CLINIC_OR_DEPARTMENT_OTHER)
Admission: RE | Admit: 2022-04-02 | Discharge: 2022-04-02 | Disposition: A | Discharge status: Home / Self Care | Payer: No Typology Code available for payment source | Source: Ambulatory Visit | Attending: Family Medicine | Admitting: Family Medicine

## 2022-04-02 ENCOUNTER — Encounter: Payer: Self-pay | Admitting: Family Medicine

## 2022-04-02 ENCOUNTER — Ambulatory Visit (INDEPENDENT_AMBULATORY_CARE_PROVIDER_SITE_OTHER): Payer: No Typology Code available for payment source | Admitting: Family Medicine

## 2022-04-02 VITALS — BP 108/62 | HR 63 | Temp 98.1°F | Ht 62.0 in | Wt 162.0 lb

## 2022-04-02 DIAGNOSIS — R61 Generalized hyperhidrosis: Secondary | ICD-10-CM

## 2022-04-02 LAB — CBC
HCT: 36.6 % (ref 36.0–46.0)
Hemoglobin: 12.4 g/dL (ref 12.0–15.0)
MCHC: 33.9 g/dL (ref 30.0–36.0)
MCV: 98.1 fl (ref 78.0–100.0)
Platelets: 192 10*3/uL (ref 150.0–400.0)
RBC: 3.73 Mil/uL — ABNORMAL LOW (ref 3.87–5.11)
RDW: 13.6 % (ref 11.5–15.5)
WBC: 6.6 10*3/uL (ref 4.0–10.5)

## 2022-04-02 LAB — COMPREHENSIVE METABOLIC PANEL
ALT: 12 U/L (ref 0–35)
AST: 18 U/L (ref 0–37)
Albumin: 4.3 g/dL (ref 3.5–5.2)
Alkaline Phosphatase: 49 U/L (ref 39–117)
BUN: 17 mg/dL (ref 6–23)
CO2: 26 mEq/L (ref 19–32)
Calcium: 8.9 mg/dL (ref 8.4–10.5)
Chloride: 106 mEq/L (ref 96–112)
Creatinine, Ser: 0.79 mg/dL (ref 0.40–1.20)
GFR: 103.42 mL/min (ref >60.00)
Glucose, Bld: 94 mg/dL (ref 70–99)
Potassium: 4.4 mEq/L (ref 3.5–5.1)
Sodium: 138 mEq/L (ref 135–145)
Total Bilirubin: 0.6 mg/dL (ref 0.2–1.2)
Total Protein: 6.3 g/dL (ref 6.0–8.3)

## 2022-04-02 LAB — TSH: TSH: 1.04 u[IU]/mL (ref 0.35–5.50)

## 2022-04-02 NOTE — Telephone Encounter (Signed)
Pt stated is wanting to get a TB test done since one member of the family is positive to tb, member got it done recently.  Pt mentioned lately has been having heavy sweets at night time and is worried about also being positive. Please advise

## 2022-04-02 NOTE — Patient Instructions (Signed)
Give [place] 2-3 business days to get the results of your labs back.   We should consider reflux if the labs are normal.   Let us know if you need anything.

## 2022-04-02 NOTE — Progress Notes (Signed)
Chief Complaint  Patient presents with   Sister tested postive for TB yesterday    Night sweats, started when she stopped Wegovy     Subjective: Patient is a 26 y.o. female here for night sweats.  Over the past 6 weeks since stopping Wegovy, the patient has had night sweats.  This has not changed much since it started.  She does not sweat during the day.  She has not lost weight unintentionally, in fact she has gained around 10 to 15 pounds.  She has no history of diabetes but does have a history of reflux.  She is not taking Protonix daily.  Her sister tested positive for TB but she has not been around her in the last 4 weeks.  Her sister does not have any symptoms.  No recent travel to TB endemic areas herself.  Her bedroom is set at around 68 degrees and she does not wear excessively warm clothing to bed.  Past Medical History:  Diagnosis Date   Anxiety    COVID-19    Depression    Depression, major, recurrent, moderate ([place]) 05/25/2012   History of pyelonephritis 07/23/2018   Mucocele of lip 01/2020   Obesity (BMI 30-39.9) 07/23/2018   [facility]   Pityriasis versicolor 03/19/2021   Recurrent UTI 07/23/2018   Thyroid disorder     Objective: BP 108/62 (BP Location: Left Arm, Patient Position: Sitting, Cuff Size: Normal)   Pulse 63   Temp 98.1 F (36.7 C) (Oral)   Ht 5\' 2"  (1.575 m)   Wt 162 lb (73.5 kg)   LMP 03/06/2022 (Exact Date)   SpO2 99%   BMI 29.63 kg/m  General: Awake, appears stated age Mouth: MMM Heart: RRR, no LE edema Lungs: CTAB, no rales, wheezes or rhonchi. No accessory muscle use Psych: Age appropriate judgment and insight, normal affect and mood  Assessment and Plan: Night sweats - Plan: TSH, CBC, Comprehensive metabolic panel, QuantiFERON-TB Gold Plus, DG Chest 2 View  Check above labs.  Could be uncontrolled reflux.  If labs are normal, she will start taking Protonix nightly for few weeks.  Check a chest x-ray as required by work.  The  patient voiced understanding and agreement to the plan.  [place], DO 04/02/22  11:29 AM

## 2022-04-02 NOTE — Telephone Encounter (Signed)
Patient scheduled with Dr. Nani Ravens today.

## 2022-04-09 ENCOUNTER — Encounter: Payer: Self-pay | Admitting: Advanced Practice Midwife

## 2022-04-09 ENCOUNTER — Other Ambulatory Visit (HOSPITAL_BASED_OUTPATIENT_CLINIC_OR_DEPARTMENT_OTHER): Payer: Self-pay

## 2022-04-09 ENCOUNTER — Encounter: Payer: Self-pay | Admitting: Family Medicine

## 2022-04-09 ENCOUNTER — Ambulatory Visit (INDEPENDENT_AMBULATORY_CARE_PROVIDER_SITE_OTHER): Payer: No Typology Code available for payment source | Admitting: Advanced Practice Midwife

## 2022-04-09 ENCOUNTER — Ambulatory Visit (INDEPENDENT_AMBULATORY_CARE_PROVIDER_SITE_OTHER): Payer: No Typology Code available for payment source | Admitting: Family Medicine

## 2022-04-09 VITALS — BP 108/68 | HR 78 | Temp 98.3°F | Ht 62.0 in | Wt 153.0 lb

## 2022-04-09 DIAGNOSIS — H6993 Unspecified Eustachian tube disorder, bilateral: Secondary | ICD-10-CM | POA: Diagnosis not present

## 2022-04-09 DIAGNOSIS — Z30431 Encounter for routine checking of intrauterine contraceptive device: Secondary | ICD-10-CM | POA: Diagnosis not present

## 2022-04-09 DIAGNOSIS — Z975 Presence of (intrauterine) contraceptive device: Secondary | ICD-10-CM

## 2022-04-09 MED ORDER — AZELASTINE HCL 0.1 % NA SOLN
2.0000 | Freq: Two times a day (BID) | NASAL | 1 refills | Status: DC
  Filled 2022-04-09: qty 30, 30d supply, fill #0

## 2022-04-09 NOTE — Patient Instructions (Signed)
Continue the Flonase.   Let us know if you need anything.

## 2022-04-09 NOTE — Progress Notes (Signed)
GYNECOLOGY Follow-Up ENCOUNTER NOTE  Subjective:   Barbara Lutz is a 26 y.o. G0P0000 female here for an IUD String Check  Current complaints: none.  Had a much lighter, 4-day period and is really pleased with that..   Denies abnormal vaginal bleeding, discharge, pelvic pain, problems with intercourse or other gynecologic concerns.    Gynecologic History Patient's last menstrual period was 04/01/2022 (exact date). Contraception: IUD  Obstetric History OB History  Gravida Para Term Preterm AB Living  0 0 0 0 0 0  SAB IAB Ectopic Multiple Live Births  0 0 0 0 0    Past Medical History:  Diagnosis Date   Anxiety    COVID-19    Depression    Depression, major, recurrent, moderate ([facility]) 05/25/2012   History of pyelonephritis 07/23/2018   Mucocele of lip 01/2020   Obesity (BMI 30-39.9) 07/23/2018   [facility]   Pityriasis versicolor 03/19/2021   Recurrent UTI 07/23/2018   Thyroid disorder     Past Surgical History:  Procedure Laterality Date   ORAL MUCOCELE EXCISION      Current Outpatient Medications on File Prior to Visit  Medication Sig Dispense Refill   citalopram (CELEXA) 40 MG tablet Take 1 tablet (40 mg total) by mouth daily. 90 tablet 0   levonorgestrel (MIRENA) 20 MCG/DAY IUD 1 each by Intrauterine route once.     pantoprazole (PROTONIX) 40 MG tablet Take 1 tablet (40 mg total) by mouth daily. 30 tablet 3   No current facility-administered medications on file prior to visit.    Allergies  Allergen Reactions   Latex Rash   Nickel Dermatitis    Social History   Socioeconomic History   Marital status: Single    Spouse name: Not on file   Number of children: Not on file   Years of education: Not on file   Highest education level: Not on file  Occupational History   Not on file  Tobacco Use   Smoking status: Some Days    Packs/day: 0.00    Types: Cigarettes   Smokeless tobacco: Never  Vaping Use   Vaping Use: Every day  Substance and  Sexual Activity   Alcohol use: Yes    Comment: social   Drug use: No   Sexual activity: Not on file  Other Topics Concern   Not on file  Social History Narrative   Marital status/children/pets: Single.   Education/employment: GED.  Employed. Manager.   Safety:      -smoke alarm in the home:Yes     - wears seatbelt: Yes     - Feels safe in their relationships: Yes   Social Determinants of Health   Financial Resource Strain: Not on file  Food Insecurity: Not on file  Transportation Needs: Not on file  Physical Activity: Not on file  Stress: Not on file  Social Connections: Not on file  Intimate Partner Violence: Not on file    Family History  Problem Relation Age of Onset   COPD Mother    Hypertension Mother    Heart attack Father    High Cholesterol Father    Depression Sister    Diabetes Maternal Grandfather    Hearing loss Maternal Grandfather    Heart attack Maternal Grandfather    Heart disease Maternal Grandfather    Hypertension Maternal Grandfather    Hyperlipidemia Maternal Grandfather    ADD / ADHD Paternal Grandfather    Alcohol abuse Paternal Grandfather    COPD Paternal Grandfather  Diabetes Paternal Grandfather    Hearing loss Paternal Grandfather    Hyperlipidemia Paternal Grandfather    Hypertension Paternal Grandfather    Stroke Paternal Grandfather    Asthma Sister    Depression Sister    Drug abuse Sister    Breast cancer Maternal Grandmother    Pancreatic cancer Paternal Grandmother    Arthritis Paternal Grandmother    Hypertension Paternal Grandmother     The following portions of the patient's history were reviewed and updated as appropriate: allergies, current medications, past family history, past medical history, past social history, past surgical history and problem list.  Review of Systems Pertinent items noted in HPI and remainder of comprehensive ROS otherwise negative.   Objective:  BP 119/75   Pulse 73   Wt 153 lb (69.4  kg)   LMP 04/01/2022 (Exact Date)   BMI 27.98 kg/m  CONSTITUTIONAL: Well-developed, well-nourished female in no acute distress.  SKIN: Skin is warm and dry.  PSYCHIATRIC: Normal mood and affect.  CARDIOVASCULAR: Normal heart rate noted RESPIRATORY: Effort normal ABDOMEN: No tenderness, rebound or guarding.  PELVIC: Normal appearing external genitalia; normal appearing vaginal mucosa and cervix.  No abnormal discharge noted.  IUD string visualized.   Assessment:  Status post insertion of Mirena IUD String visible at cervical Os   Plan:  Continue normal activity Discussed possible AUB within the first 3-46months of IUD insertion, would use ibuprofen first then OCPs if necessary Routine preventative health maintenance measures emphasized. Please refer to After Visit Summary for other counseling recommendations.

## 2022-04-09 NOTE — Progress Notes (Signed)
Chief Complaint  Patient presents with   Ear Fullness    congestion    [facility] here for URI complaints.  Duration: 1 week  Associated symptoms: sinus pressure, runny/stuffy nose, popping in ears Denies: sinus pain, itchy watery eyes, ear drainage, sore throat, wheezing, shortness of breath, myalgia, and fevers Treatment to date: Sudafed, Cold and flu cough syrup, Tylenol, ibuprofen, INCS Sick contacts: Yes- family members  Past Medical History:  Diagnosis Date   Anxiety    COVID-19    Depression    Depression, major, recurrent, moderate ([place]) 05/25/2012   History of pyelonephritis 07/23/2018   Mucocele of lip 01/2020   Obesity (BMI 30-39.9) 07/23/2018   [facility]   Pityriasis versicolor 03/19/2021   Recurrent UTI 07/23/2018   Thyroid disorder     Objective BP 108/68 (BP Location: Left Arm, Patient Position: Sitting, Cuff Size: Normal)   Pulse 78   Temp 98.3 F (36.8 C) (Oral)   Ht 5\' 2"  (1.575 m)   Wt 153 lb (69.4 kg)   LMP 04/01/2022 (Exact Date)   SpO2 98%   BMI 27.98 kg/m  General: Awake, alert, appears stated age HEENT: AT, , ears patent b/l and TM's neg, nares patent w/o discharge, pharynx pink and without exudates, MMM, mild ttp over maxillary sinuses b/l Neck: No masses or asymmetry Heart: RRR Lungs: CTAB, no accessory muscle use Psych: Age appropriate judgment and insight, normal mood and affect  Dysfunction of both eustachian tubes - Plan: azelastine (ASTELIN) 0.1 % nasal spray  Cont INCS. Add Astelin. Continue to push fluids, practice good hand hygiene, cover mouth when coughing. F/u prn. If starting to experience fevers, shaking, or shortness of breath, seek immediate care. Pt voiced understanding and agreement to the plan.  [place], DO 04/09/22 8:39 AM

## 2022-04-10 ENCOUNTER — Ambulatory Visit (INDEPENDENT_AMBULATORY_CARE_PROVIDER_SITE_OTHER): Payer: No Typology Code available for payment source | Admitting: Psychology

## 2022-04-10 DIAGNOSIS — F4323 Adjustment disorder with mixed anxiety and depressed mood: Secondary | ICD-10-CM | POA: Diagnosis not present

## 2022-04-10 NOTE — Progress Notes (Signed)
[place] Counselor/Therapist Progress Note  Patient ID: Meleni Delahunt, MRN: [id number],    Date: 04/10/2022  Time Spent: 12:00pm - 12:55pm    55 minutes   Treatment Type: Individual Therapy  Reported Symptoms: anxiety  Mental Status Exam: Appearance:  Casual     Behavior: Appropriate  Motor: Normal  Speech/Language:  Normal Rate  Affect: Appropriate  Mood: normal  Thought process: normal  Thought content:   WNL  Sensory/Perceptual disturbances:   WNL  Orientation: oriented to person, place, time/date, and situation  Attention: Good  Concentration: Good  Memory: WNL  Fund of knowledge:  Good  Insight:   Good  Judgment:  Good  Impulse Control: Good   Risk Assessment: Danger to Self:  No Self-injurious Behavior: No Danger to Others: No Duty to Warn:no Physical Aggression / Violence:No  Access to Firearms a concern: No  Gang Involvement:No   Subjective:  Pt present for face-to-face individual therapy in person.   Pt talked about not having her license yet.  She has to wait until December 40CX to get her license.   Pt did find out she doesn't have to get a blow and go in her car.  She did not get points on her license which is a relief.   Pt talked about having an incedent with family this past weekend.  Pt got upset at her mother.  Pt states her mother is selfish and pt got frustrated with her and snapped at her.  Addressed the incedent and Helped pt process her feelings and relationship dynamics.  Pt's mother has an opiod addiction for the past 20 years.   Pt feels like there is a role reversal and she has to mother her mother.   Pt talked about her goal to stop smoking weed.   Worked on healthy coping strategies to replace smoking to cope.   Pt plans to talk to her PCP to see if there are medication adjustments that can help with her anxiety.   Encouraged pt to talk with him about her marijuana use and interest in stopping as well. Provided supportive  therapy.    Interventions: Cognitive Behavioral Therapy and Insight-Oriented  Diagnosis:  F43.23  Plan of Care: Recommend ongoing therapy.   Pt participated in setting treatment goals.   Pt wants to improve coping skills and cope without using weed or getting angry.   Pt wants to manage stress better.    Plan to meet every two weeks.    Treatment Plan (Treatment Plan Target Date:  02/07/2023) Client Abilities/Strengths  Pt is bright, engaging, and motivated for therapy.  Client Treatment Preferences  Individual therapy.  Client Statement of Needs  Improve copings skills and understand herself better. Improve self esteem.  Symptoms  Depressed or irritable mood. Excessive and/or unrealistic worry that is difficult to control occurring more days than not for at least 6 months about a number of events or activities. Hypervigilance (e.g., feeling constantly on edge, experiencing concentration difficulties, having trouble falling or staying asleep, exhibiting a general state of irritability). Low self-esteem. Problems Addressed  Unipolar Depression, Anxiety Goals 1. Alleviate depressive symptoms and return to previous level of effective functioning. 2. Appropriately grieve the loss in order to normalize mood and to return to previously adaptive level of functioning. Objective Learn and implement behavioral strategies to overcome depression. Target Date: 2023-02-07 Frequency: Biweekly  Progress: 10 Modality: individual  Related Interventions Assist the client in developing skills that increase the likelihood of deriving pleasure from behavioral  activation (e.g., assertiveness skills, developing an exercise plan, less internal/more external focus, increased social involvement); reinforce success. Engage the client in "behavioral activation," increasing his/her activity level and contact with sources of reward, while identifying processes that inhibit activation. use behavioral techniques such as  instruction, rehearsal, role-playing, role reversal, as needed, to facilitate activity in the client's daily life; reinforce success. 3. Develop healthy interpersonal relationships that lead to the alleviation and help prevent the relapse of depression. 4. Develop healthy thinking patterns and beliefs about self, others, and the world that lead to the alleviation and help prevent the relapse of depression. 5. Enhance ability to effectively cope with the full variety of life's worries and anxieties. 6. Learn and implement coping skills that result in a reduction of anxiety and worry, and improved daily functioning. Objective Learn and implement problem-solving strategies for realistically addressing worries. Target Date: 2023-02-07 Frequency: Biweekly  Progress: 10 Modality: individual  Related Interventions Assign the client a homework exercise in which he/she problem-solves a current problem (see Mastery of Your Anxiety and Worry: Workbook by Elenora Fender and Filbert Schilder or Generalized Anxiety Disorder by Elesa Hacker, and Filbert Schilder); review, reinforce success, and provide corrective feedback toward improvement. Teach the client problem-solving strategies involving specifically defining a problem, generating options for addressing it, evaluating the pros and cons of each option, selecting and implementing an optional action, and reevaluating and refining the action. Objective Learn and implement calming skills to reduce overall anxiety and manage anxiety symptoms. Target Date: 2023-02-07 Frequency: Biweekly  Progress: 10 Modality: individual  Related Interventions Assign the client to read about progressive muscle relaxation and other calming strategies in relevant books or treatment manuals (e.g., Progressive Relaxation Training by Twana First; Mastery of Your Anxiety and Worry: Workbook by Earlie Counts). Assign the client homework each session in which he/she practices relaxation exercises  daily, gradually applying them progressively from non-anxiety-provoking to anxiety-provoking situations; review and reinforce success while providing corrective feedback toward improvement. Teach the client calming/relaxation skills (e.g., applied relaxation, progressive muscle relaxation, cue controlled relaxation; mindful breathing; biofeedback) and how to discriminate better between relaxation and tension; teach the client how to apply these skills to his/her daily life. 7. Recognize, accept, and cope with feelings of depression. 8. Reduce overall frequency, intensity, and duration of the anxiety so that daily functioning is not impaired. 9. Resolve the core conflict that is the source of anxiety. 10. Stabilize anxiety level while increasing ability to function on a daily basis. Diagnosis F43.23 Conditions For Discharge Achievement of treatment goals and objectives   Salomon Fick, LCSW

## 2022-04-12 ENCOUNTER — Encounter: Payer: No Typology Code available for payment source | Admitting: Family Medicine

## 2022-04-24 ENCOUNTER — Ambulatory Visit: Payer: No Typology Code available for payment source | Admitting: Psychology

## 2022-04-25 ENCOUNTER — Other Ambulatory Visit (HOSPITAL_COMMUNITY)
Admission: RE | Admit: 2022-04-25 | Discharge: 2022-04-25 | Disposition: A | Discharge status: Home / Self Care | Payer: No Typology Code available for payment source | Source: Ambulatory Visit | Attending: Family Medicine | Admitting: Family Medicine

## 2022-04-25 ENCOUNTER — Other Ambulatory Visit (INDEPENDENT_AMBULATORY_CARE_PROVIDER_SITE_OTHER): Payer: No Typology Code available for payment source

## 2022-04-25 DIAGNOSIS — N898 Other specified noninflammatory disorders of vagina: Secondary | ICD-10-CM | POA: Diagnosis present

## 2022-04-25 DIAGNOSIS — R8290 Unspecified abnormal findings in urine: Secondary | ICD-10-CM

## 2022-04-25 LAB — POC URINALSYSI DIPSTICK (AUTOMATED)
Bilirubin, UA: NEGATIVE
Blood, UA: NEGATIVE
Glucose, UA: NEGATIVE
Ketones, UA: NEGATIVE
Leukocytes, UA: NEGATIVE
Nitrite, UA: NEGATIVE
Protein, UA: NEGATIVE
Spec Grav, UA: 1.025 (ref 1.010–1.025)
Urobilinogen, UA: 0.2 E.U./dL
pH, UA: 6 (ref 5.0–8.0)

## 2022-04-26 ENCOUNTER — Other Ambulatory Visit (HOSPITAL_BASED_OUTPATIENT_CLINIC_OR_DEPARTMENT_OTHER): Payer: Self-pay

## 2022-04-26 ENCOUNTER — Ambulatory Visit: Payer: No Typology Code available for payment source | Admitting: Family Medicine

## 2022-04-26 ENCOUNTER — Other Ambulatory Visit: Payer: Self-pay | Admitting: Family Medicine

## 2022-04-26 DIAGNOSIS — N760 Acute vaginitis: Secondary | ICD-10-CM

## 2022-04-26 LAB — URINE CULTURE
MICRO NUMBER:: 14167364
Result:: NO GROWTH
SPECIMEN QUALITY:: ADEQUATE

## 2022-04-26 LAB — CERVICOVAGINAL ANCILLARY ONLY
Bacterial Vaginitis (gardnerella): NEGATIVE
Candida Glabrata: NEGATIVE
Candida Vaginitis: NEGATIVE
Chlamydia: NEGATIVE
Comment: NEGATIVE
Comment: NEGATIVE
Comment: NEGATIVE
Comment: NEGATIVE
Comment: NEGATIVE
Comment: NORMAL
Neisseria Gonorrhea: NEGATIVE
Trichomonas: NEGATIVE

## 2022-04-26 MED ORDER — FLUCONAZOLE 150 MG PO TABS
ORAL_TABLET | ORAL | 0 refills | Status: DC
  Filled 2022-04-26: qty 2, 3d supply, fill #0

## 2022-04-28 NOTE — Progress Notes (Deleted)
   Established Patient Office Visit  Subjective   Patient ID: Barbara Lutz, female    DOB: 1995/07/06  Age: 26 y.o. MRN: [id number]  No chief complaint on file.   HPI  Patient presents for routine follow-up. ***  Weight management: She reached her goal weight a few months ago and weaned off of Wegovy. Focusing on healthy lifestyle choices.   Dysmenorrhea: Following with GYN. Mirena placed   Anxiety:  - Citalopram 40 mg daily   In August, patient had abdominal pain with nausea and vomiting which led to an ED visit and family medicine follow-up. At that time her LFTs were elevated, but have since trended back down. Providers suspected potential reaction to [facility] (she had reached ideal weight and was weaning down dose, but went ahead and stopped completely). Family medicine repeated labs given fevers and decided on empiric ABX management. She was also started on protonix, carafate and following with GI. She was also noted to have a UTI with some atypical cells and was encouraged to follow-up with urology.    {History (Optional):23778}  ROS    Objective:     LMP 04/01/2022 (Exact Date)  {Vitals History (Optional):23777}  Physical Exam   No results found for any visits on 04/29/22.  {Labs (Optional):23779}  The ASCVD Risk score (Arnett DK, et al., 2019) failed to calculate for the following reasons:   The 2019 ASCVD risk score is only valid for ages 72 to 70    Assessment & Plan:   Problem List Items Addressed This Visit   None   No follow-ups on file.    Clayborne Dana, NP

## 2022-04-29 ENCOUNTER — Ambulatory Visit: Payer: No Typology Code available for payment source | Admitting: Family Medicine

## 2022-04-29 NOTE — Progress Notes (Unsigned)
   Established Patient Office Visit  Subjective   Patient ID: Barbara Lutz, female    DOB: [birth year]  Age: 26 y.o. MRN: [id number]  No chief complaint on file.   HPI  {History (Optional):23778}  ROS    Objective:     LMP 04/01/2022 (Exact Date)  {Vitals History (Optional):23777}  Physical Exam   No results found for any visits on 04/30/22.  {Labs (Optional):23779}  The ASCVD Risk score (Arnett DK, et al., 2019) failed to calculate for the following reasons:   The 2019 ASCVD risk score is only valid for ages 26 to 69    Assessment & Plan:   Problem List Items Addressed This Visit   None   No follow-ups on file.    Clayborne Dana, NP

## 2022-04-30 ENCOUNTER — Encounter: Payer: Self-pay | Admitting: Family Medicine

## 2022-04-30 ENCOUNTER — Other Ambulatory Visit (HOSPITAL_BASED_OUTPATIENT_CLINIC_OR_DEPARTMENT_OTHER): Payer: Self-pay

## 2022-04-30 ENCOUNTER — Ambulatory Visit (INDEPENDENT_AMBULATORY_CARE_PROVIDER_SITE_OTHER): Payer: No Typology Code available for payment source | Admitting: Family Medicine

## 2022-04-30 VITALS — BP 111/62 | HR 74 | Ht 62.0 in | Wt 163.8 lb

## 2022-04-30 DIAGNOSIS — F32A Depression, unspecified: Secondary | ICD-10-CM | POA: Diagnosis not present

## 2022-04-30 DIAGNOSIS — F411 Generalized anxiety disorder: Secondary | ICD-10-CM | POA: Diagnosis not present

## 2022-04-30 DIAGNOSIS — E669 Obesity, unspecified: Secondary | ICD-10-CM | POA: Diagnosis not present

## 2022-04-30 MED ORDER — CITALOPRAM HYDROBROMIDE 40 MG PO TABS
40.0000 mg | ORAL_TABLET | Freq: Every day | ORAL | 0 refills | Status: DC
  Filled 2022-04-30: qty 90, 90d supply, fill #0

## 2022-04-30 MED ORDER — PHENTERMINE HCL 15 MG PO CAPS
15.0000 mg | ORAL_CAPSULE | Freq: Every morning | ORAL | 0 refills | Status: DC
  Filled 2022-04-30: qty 30, 30d supply, fill #0

## 2022-04-30 MED ORDER — PANTOPRAZOLE SODIUM 40 MG PO TBEC
40.0000 mg | DELAYED_RELEASE_TABLET | Freq: Every day | ORAL | 3 refills | Status: DC
  Filled 2022-04-30: qty 30, 30d supply, fill #0
  Filled 2022-05-31: qty 30, 30d supply, fill #1
  Filled 2022-07-09: qty 30, 30d supply, fill #2
  Filled 2022-08-06: qty 30, 30d supply, fill #3

## 2022-04-30 NOTE — Assessment & Plan Note (Addendum)
No longer on Wegovy due to GI issues. Weight has started to come back. Her goal weight is about 150 pounds (she felt the best at this weight). She would really like to try phentermine since she tolerated that well in the past. I am hesitant given her anxiety, but she would like to try since she did not have side effects in the past. Offered MWM referral, but she would like to do a trial of this first. We had a thorough risk vs benefit discussion of the medication and potential side effects (especially BP/HR changes, worsening anxiety, and potential risk for serotonin syndrome while on Celexa - low risk since keeping low doses, but she is aware). Information sheet added to AVS. Advised her to monitor BP/HR/anxiety and stop medication if she develops any side effects and we will do MWM referral instead. Continue healthy lifestyle choices. 1 month f/u.

## 2022-04-30 NOTE — Assessment & Plan Note (Signed)
Doing great on Celexa. No SI/HI. No changes today.

## 2022-04-30 NOTE — Patient Instructions (Signed)
We had a discussion of risk vs benefits of phentermine. Starting low dose trial to see how you do. I do not like to prescribe this long-term, so we will see how you do with a trial. Keep up the good work with lifestyle measures. Follow-up in 1 month to discuss medication and weight check.

## 2022-05-22 ENCOUNTER — Ambulatory Visit (INDEPENDENT_AMBULATORY_CARE_PROVIDER_SITE_OTHER): Payer: No Typology Code available for payment source | Admitting: Psychology

## 2022-05-22 DIAGNOSIS — F4323 Adjustment disorder with mixed anxiety and depressed mood: Secondary | ICD-10-CM

## 2022-05-22 NOTE — Progress Notes (Signed)
[facility] Counselor/Therapist Progress Note  Patient ID: Barbara Lutz, MRN: [id number],    Date: 05/22/2022  Time Spent: 12:00pm - 12:55pm    55 minutes   Treatment Type: Individual Therapy  Reported Symptoms: anxiety  Mental Status Exam: Appearance:  Casual     Behavior: Appropriate  Motor: Normal  Speech/Language:  Normal Rate  Affect: Appropriate  Mood: normal  Thought process: normal  Thought content:   WNL  Sensory/Perceptual disturbances:   WNL  Orientation: oriented to person, place, time/date, and situation  Attention: Good  Concentration: Good  Memory: WNL  Fund of knowledge:  Good  Insight:   Good  Judgment:  Good  Impulse Control: Good   Risk Assessment: Danger to Self:  No Self-injurious Behavior: No Danger to Others: No Duty to Warn:no Physical Aggression / Violence:No  Access to Firearms a concern: No  Gang Involvement:No   Subjective:  Pt present for face-to-face individual therapy in person.  Pt is nervous about finding a car insurance that will take her.  Pt has to get insurance before she can get a car.   Pt will get her drivers license on Monday 12/10 but has to get car insurance before she can get a new car.  Pt is feeling stress about everything she has to do to prepare for driving again.  Pt states she has gained weight and has anxiety about the weight gain.  Her PCP prescribed fenermine which is an appetite suppressant.  Pt has body dysmorphia.  She sees herself as bigger than she is.  She has a lot of body image issues.   She was bullied in high school about her weight.   Pt talked about her relationship with her sister Barbara Lutz. One of pt's sisters Barbara Lutz she considers "toxic" and so she does not associate with her.  The other sister Barbara Lutz has been pt's best friend.  Pt feels Barbara Lutz has gotten jealous of pt bc of pt's attractiveness and social skills.  They have been disagreeing with each other more than ever.   Barbara Lutz said some very  hurtful things to pt so pt has blocked contact with her.  Pt is even considering not attending family Christmas.  Helped pt process her feelings and relationship dynamics.   Provided supportive therapy.    Interventions: Cognitive Behavioral Therapy and Insight-Oriented  Diagnosis:  F43.23  Plan of Care: Recommend ongoing therapy.   Pt participated in setting treatment goals.   Pt wants to improve coping skills and cope without using weed or getting angry.   Pt wants to manage stress better.    Plan to meet every two weeks.    Treatment Plan (Treatment Plan Target Date:  02/07/2023) Client Abilities/Strengths  Pt is bright, engaging, and motivated for therapy.  Client Treatment Preferences  Individual therapy.  Client Statement of Needs  Improve copings skills and understand herself better. Improve self esteem.  Symptoms  Depressed or irritable mood. Excessive and/or unrealistic worry that is difficult to control occurring more days than not for at least 6 months about a number of events or activities. Hypervigilance (e.g., feeling constantly on edge, experiencing concentration difficulties, having trouble falling or staying asleep, exhibiting a general state of irritability). Low self-esteem. Problems Addressed  Unipolar Depression, Anxiety Goals 1. Alleviate depressive symptoms and return to previous level of effective functioning. 2. Appropriately grieve the loss in order to normalize mood and to return to previously adaptive level of functioning. Objective Learn and implement behavioral strategies to overcome  depression. Target Date: 2023-02-07 Frequency: Biweekly  Progress: 10 Modality: individual  Related Interventions Assist the client in developing skills that increase the likelihood of deriving pleasure from behavioral activation (e.g., assertiveness skills, developing an exercise plan, less internal/more external focus, increased social involvement); reinforce success. Engage  the client in "behavioral activation," increasing his/her activity level and contact with sources of reward, while identifying processes that inhibit activation. use behavioral techniques such as instruction, rehearsal, role-playing, role reversal, as needed, to facilitate activity in the client's daily life; reinforce success. 3. Develop healthy interpersonal relationships that lead to the alleviation and help prevent the relapse of depression. 4. Develop healthy thinking patterns and beliefs about self, others, and the world that lead to the alleviation and help prevent the relapse of depression. 5. Enhance ability to effectively cope with the full variety of life's worries and anxieties. 6. Learn and implement coping skills that result in a reduction of anxiety and worry, and improved daily functioning. Objective Learn and implement problem-solving strategies for realistically addressing worries. Target Date: 2023-02-07 Frequency: Biweekly  Progress: 10 Modality: individual  Related Interventions Assign the client a homework exercise in which he/she problem-solves a current problem (see Mastery of Your Anxiety and Worry: Workbook by Adora Fridge and Eliot Ford or Generalized Anxiety Disorder by Eather Colas, and Eliot Ford); review, reinforce success, and provide corrective feedback toward improvement. Teach the client problem-solving strategies involving specifically defining a problem, generating options for addressing it, evaluating the pros and cons of each option, selecting and implementing an optional action, and reevaluating and refining the action. Objective Learn and implement calming skills to reduce overall anxiety and manage anxiety symptoms. Target Date: 2023-02-07 Frequency: Biweekly  Progress: 10 Modality: individual  Related Interventions Assign the client to read about progressive muscle relaxation and other calming strategies in relevant books or treatment manuals (e.g., Progressive  Relaxation Training by Leroy Kennedy; Mastery of Your Anxiety and Worry: Workbook by Beckie Busing). Assign the client homework each session in which he/she practices relaxation exercises daily, gradually applying them progressively from non-anxiety-provoking to anxiety-provoking situations; review and reinforce success while providing corrective feedback toward improvement. Teach the client calming/relaxation skills (e.g., applied relaxation, progressive muscle relaxation, cue controlled relaxation; mindful breathing; biofeedback) and how to discriminate better between relaxation and tension; teach the client how to apply these skills to his/her daily life. 7. Recognize, accept, and cope with feelings of depression. 8. Reduce overall frequency, intensity, and duration of the anxiety so that daily functioning is not impaired. 9. Resolve the core conflict that is the source of anxiety. 10. Stabilize anxiety level while increasing ability to function on a daily basis. Diagnosis F43.23 Conditions For Discharge Achievement of treatment goals and objectives   Clint Bolder, LCSW

## 2022-05-24 ENCOUNTER — Ambulatory Visit (INDEPENDENT_AMBULATORY_CARE_PROVIDER_SITE_OTHER): Payer: No Typology Code available for payment source | Admitting: Family Medicine

## 2022-05-24 ENCOUNTER — Ambulatory Visit: Payer: No Typology Code available for payment source | Admitting: Family Medicine

## 2022-05-24 ENCOUNTER — Encounter: Payer: Self-pay | Admitting: Family Medicine

## 2022-05-24 VITALS — BP 124/68 | HR 73 | Temp 98.4°F | Resp 16 | Ht 62.0 in | Wt 165.0 lb

## 2022-05-24 DIAGNOSIS — Z716 Tobacco abuse counseling: Secondary | ICD-10-CM

## 2022-05-24 DIAGNOSIS — F411 Generalized anxiety disorder: Secondary | ICD-10-CM

## 2022-05-24 MED ORDER — BUPROPION HCL ER (SR) 150 MG PO TB12: 150.0000 mg | ORAL_TABLET | Freq: Two times a day (BID) | ORAL | 1 refills | Status: DC

## 2022-05-24 NOTE — Progress Notes (Signed)
Established Patient Office Visit  Subjective   Patient ID: Barbara Lutz, female    DOB: 11-12-1995  Age: 26 y.o. MRN: [id number]  Chief Complaint  Patient presents with   Follow-up    HPI  Patient is here for 1 month follow-up.   Last month we had a discussion regarding weight management. She stopped Wegovy due to GI effects. She opted to try phentermine (thorough risk vs benefit discussion) before considering MWM referral.   Reports she has tolerated the phentermine alright, but she has noticed it make her slightly more anxious. She has not seen any weight loss, but does feel like it helps suppress her appetite. She wants to hold off on it for now. Continuing lifestyle measures only for now. Will consider MWM referral.   She is wanting to quite smoking/vaping. Asking to try Wellbutrin since she also has anxiety as well to see if this may help both. She is still doing really well on the Celexa and not wanting any changes right now. No SI/HI.   She feels a little more anxious on the phentermine, but has been helping with appetite supression in the morning.        05/24/2022   12:45 PM 01/11/2022    8:15 AM 12/14/2021   11:19 AM  PHQ9 SCORE ONLY  PHQ-9 Total Score 11 5 16       05/24/2022   12:47 PM 01/11/2022    8:17 AM 12/14/2021   11:20 AM 10/19/2021    8:44 AM  GAD 7 : Generalized Anxiety Score  Nervous, Anxious, on Edge 2 1 2 2   Control/stop worrying 2 0 3 2  Worry too much - different things 2 1 3 2   Trouble relaxing 1 1 1 1   Restless 1 0 1 1  Easily annoyed or irritable 1 2 3 1   Afraid - awful might happen 0 0 1 1  Total GAD 7 Score 9 5 14 10   Anxiety Difficulty Somewhat difficult Somewhat difficult Very difficult Somewhat difficult           ROS All review of systems negative except what is listed in the HPI    Objective:     BP 124/68   Pulse 73   Temp 98.4 F (36.9 C)   Resp 16   Ht 5\' 2"  (1.575 m)   Wt 165 lb (74.8 kg)   SpO2 100%   BMI  30.18 kg/m    Physical Exam Vitals reviewed.  Constitutional:      Appearance: Normal appearance.  Pulmonary:     Effort: Pulmonary effort is normal.  Neurological:     General: No focal deficit present.     Mental Status: She is alert and oriented to person, place, and time. Mental status is at baseline.  Psychiatric:        Mood and Affect: Mood normal.        Behavior: Behavior normal.        Thought Content: Thought content normal.        Judgment: Judgment normal.      No results found for any visits on 05/24/22.    The ASCVD Risk score (Arnett DK, et al., 2019) failed to calculate for the following reasons:   The 2019 ASCVD risk score is only valid for ages 17 to 52    Assessment & Plan:   Problem List Items Addressed This Visit       Other   GAD (generalized anxiety disorder) - Primary (Chronic)  Doing great on Celexa. Slightly worse PHQ9/GAD7. Stopping phentermine and adding Wellbutrin trial. No SI/HI      Relevant Medications   buPROPion (WELLBUTRIN SR) 150 MG 12 hr tablet   Other Visit Diagnoses     Encounter for smoking cessation counseling       Relevant Medications   buPROPion (WELLBUTRIN SR) 150 MG 12 hr tablet       Return in about 3 months (around 08/23/2022) for routine follow-up, mood f/u.    Clayborne Dana, NP

## 2022-05-24 NOTE — Assessment & Plan Note (Signed)
Doing great on Celexa. Slightly worse PHQ9/GAD7. Stopping phentermine and adding Wellbutrin trial. No SI/HI

## 2022-05-27 ENCOUNTER — Ambulatory Visit: Payer: No Typology Code available for payment source | Admitting: Family Medicine

## 2022-05-28 ENCOUNTER — Ambulatory Visit: Payer: No Typology Code available for payment source | Admitting: Family Medicine

## 2022-06-05 ENCOUNTER — Ambulatory Visit: Payer: No Typology Code available for payment source | Admitting: Psychology

## 2022-06-06 ENCOUNTER — Other Ambulatory Visit (HOSPITAL_BASED_OUTPATIENT_CLINIC_OR_DEPARTMENT_OTHER): Payer: Self-pay

## 2022-06-06 ENCOUNTER — Encounter: Payer: Self-pay | Admitting: Family Medicine

## 2022-06-06 ENCOUNTER — Telehealth (INDEPENDENT_AMBULATORY_CARE_PROVIDER_SITE_OTHER): Payer: No Typology Code available for payment source | Admitting: Family Medicine

## 2022-06-06 DIAGNOSIS — E669 Obesity, unspecified: Secondary | ICD-10-CM

## 2022-06-06 MED ORDER — WEGOVY 0.25 MG/0.5ML ~~LOC~~ SOAJ
0.2500 mg | SUBCUTANEOUS | 3 refills | Status: DC
  Filled 2022-06-06 (×2): qty 2, 28d supply, fill #0

## 2022-06-06 NOTE — Patient Instructions (Addendum)
Sending in low-dose Wegovy to try again. Last CMP was normal.  After you take the first dose, please schedule a lab appointment to recheck CMP during that first week, before you take a second dose. We will monitor labs more closely this time given your lab changes over the summer on the high dose.

## 2022-06-06 NOTE — Progress Notes (Signed)
Virtual Video Visit via MyChart Note  I connected with  [name] on 06/06/22 at  9:00 AM EST by the video enabled telemedicine application for MyChart, and verified that I am speaking with the correct person using two identifiers.   I introduced myself as a Designer, jewellery with the practice. We discussed the limitations of evaluation and management by telemedicine and the availability of in person appointments. The patient expressed understanding and agreed to proceed.  Participating parties in this visit include: The patient and the nurse practitioner listed.  The patient is: At home I am: In the office - [facility] at [facility]  Subjective:    CC: weight management    HPI: Barbara Lutz is a 26 y.o. year old female presenting today via [facility] today for weight management.    -In February 2023, patient was started on Wegovy by previous PCP (weight prior to starting [facility] was 186 lbs). She gradually tapered up to max Wegovy dose and then started titrating down when she hit her goal weight of 145 lbs around June 2023. In August she started having significant GI symptoms and fevers. She had an ED visit, primary care, and GI visit. Labs revealed elevated LFTs, but imaging unremarkable other than duodenum appearing slightly larger compared to prior studies. She stopped [facility] taper altogether, and was treated with empiric antibiotics and PPIs. Labs stabilized. She has consulted with GI who she reports told her it would be reasonable to retry low-dose Wegovy, given that previous episode could have been related to taking too long to taper down after reaching goal weight.  -She has tried weight loss via phentermine as well, but no success, and it slightly worsened her anxiety.  -She is currently on Wellbutrin for mood and smoking cessation. States she is tolerating well and she does have less cravings, but weight has continued to gradually increase. -She has been working  hard to eat healthy foods, smaller portions, and remain physically active throughout the day.   Faxed message from Dr. Carol Ada received today (will scan into chart): "The patient called wondering about restarting back on Wegovy. It was presumed that [facility] was the source of the marked liver enzyme elevations as well as feeling poorly. There are reports that [facility] can cause liver enzyme elevations, however, she was well with the lower dosing. At the lower dosing she was losing weight. With caution, it can be retried, but she needs to be monitored closely. If possible she is to stay on the lowest effective dose for weight loss. Additionally, it is not known if this is a class effect."    Wt Readings from Last 3 Encounters:  05/24/22 165 lb (74.8 kg)  04/30/22 163 lb 12.8 oz (74.3 kg)  04/09/22 153 lb (69.4 kg)         Past medical history, Surgical history, Family history not pertinant except as noted below, Social history, Allergies, and medications have been entered into the medical record, reviewed, and corrections made.   Review of Systems:  All review of systems negative except what is listed in the HPI   Objective:    General:  Speaking clearly in complete sentences. Absent shortness of breath noted.   Alert and oriented x3.   Normal judgment.  Absent acute distress.   Impression and Recommendations:    Problem List Items Addressed This Visit       Other   Obesity (BMI 30-39.9) - Primary    She has regained 20+  pounds since stopping Wegovy this summer due to GI issues. She has consulted with her GI provider and we had a discussion/shared decision making regarding restarting low-dose Wegovy.  She would like to restart and is agreeable to maintain on low-dose instead of titrating up at monthly intervals. She is also agreeable to let us monitor her CMP/LFTs more closely this time given her significant elevation in August. Prescription sent in. CMP stable 2 months  ago. Recommend she schedule another CMP after her first dose of Wegovy. She is agreeable to weekly labs for the first month or so followed by longer intervals if stable.  She is aware of lifestyle measures to continue for optimal effect and overall health.       Relevant Medications   Semaglutide-Weight Management (WEGOVY) 0.25 MG/0.5ML SOAJ   Other Relevant Orders   Comp Met (CMET)       Follow-up if symptoms worsen or fail to improve.    I discussed the assessment and treatment plan with the patient. The patient was provided an opportunity to ask questions and all were answered. The patient agreed with the plan and demonstrated an understanding of the instructions.   The patient was advised to call back or seek an in-person evaluation if the symptoms worsen or if the condition fails to improve as anticipated.    Terrilyn Saver, NP

## 2022-06-06 NOTE — Assessment & Plan Note (Signed)
She has regained 20+ pounds since stopping Wegovy this summer due to GI issues. She has consulted with her GI provider and we had a discussion/shared decision making regarding restarting low-dose Wegovy.  She would like to restart and is agreeable to maintain on low-dose instead of titrating up at monthly intervals. She is also agreeable to let us monitor her CMP/LFTs more closely this time given her significant elevation in August. Prescription sent in. CMP stable 2 months ago. Recommend she schedule another CMP after her first dose of Wegovy. She is agreeable to weekly labs for the first month or so followed by longer intervals if stable.  She is aware of lifestyle measures to continue for optimal effect and overall health.

## 2022-06-07 ENCOUNTER — Other Ambulatory Visit (HOSPITAL_BASED_OUTPATIENT_CLINIC_OR_DEPARTMENT_OTHER): Payer: Self-pay

## 2022-06-07 ENCOUNTER — Telehealth: Payer: Self-pay | Admitting: Family Medicine

## 2022-06-07 DIAGNOSIS — E669 Obesity, unspecified: Secondary | ICD-10-CM

## 2022-06-07 NOTE — Telephone Encounter (Signed)
Erroneous encounter

## 2022-06-18 ENCOUNTER — Telehealth: Payer: Self-pay

## 2022-06-18 ENCOUNTER — Other Ambulatory Visit (HOSPITAL_BASED_OUTPATIENT_CLINIC_OR_DEPARTMENT_OTHER): Payer: Self-pay

## 2022-06-18 ENCOUNTER — Other Ambulatory Visit: Payer: Self-pay | Admitting: Family Medicine

## 2022-06-18 ENCOUNTER — Encounter: Payer: Self-pay | Admitting: Family Medicine

## 2022-06-18 DIAGNOSIS — F411 Generalized anxiety disorder: Secondary | ICD-10-CM

## 2022-06-18 DIAGNOSIS — Z716 Tobacco abuse counseling: Secondary | ICD-10-CM

## 2022-06-18 DIAGNOSIS — E669 Obesity, unspecified: Secondary | ICD-10-CM

## 2022-06-18 MED ORDER — ZEPBOUND 2.5 MG/0.5ML ~~LOC~~ SOAJ
2.5000 mg | SUBCUTANEOUS | 2 refills | Status: DC
  Filled 2022-06-18: qty 2, 28d supply, fill #0
  Filled 2022-07-19: qty 2, 28d supply, fill #1

## 2022-06-18 NOTE — Telephone Encounter (Signed)
PA approved.   The request has been approved. The authorization is effective from 06/18/2022 to 12/15/2022, as long as the member is enrolled in their current health plan. The request was approved as submitted. This request has been approved, allowing for 65mL (4 pens) per 28 days.Additional authorizations have been entered for the following:Zepbound 5mg /0.73mL allowing 46mL (4 pens) per 28 days; please reference authorization [id number].Zepbound 7.5mg /0.74mL allowing 89mL (4 pens) per 28 days; please reference authorization [id number].Zepbound 10mg /0.62mL allowing 2mL (4 pens) per 28 days; please reference authorization [id number].Zepbound 12mg /0.67mL allowing 8mL (4 pens) per 28 days; please reference authorization [id number].Zepbound 15mg /0.32mL allowing 44mL (4 pens) per 28 days; please reference authorization [id number].These authorizations are effective 06/18/2022 through 12/15/2022. A written notification letter will follow with additional details.

## 2022-06-18 NOTE — Telephone Encounter (Signed)
Pt discussed with provider about meds

## 2022-06-18 NOTE — Telephone Encounter (Signed)
PA initiated via Covermymeds; KEY: [id number]. Awaiting determination.

## 2022-06-21 ENCOUNTER — Other Ambulatory Visit (INDEPENDENT_AMBULATORY_CARE_PROVIDER_SITE_OTHER): Payer: 59

## 2022-06-21 DIAGNOSIS — E669 Obesity, unspecified: Secondary | ICD-10-CM | POA: Diagnosis not present

## 2022-06-21 LAB — COMPREHENSIVE METABOLIC PANEL
ALT: 11 U/L (ref 0–35)
AST: 16 U/L (ref 0–37)
Albumin: 4.3 g/dL (ref 3.5–5.2)
Alkaline Phosphatase: 52 U/L (ref 39–117)
BUN: 12 mg/dL (ref 6–23)
CO2: 27 mEq/L (ref 19–32)
Calcium: 9.1 mg/dL (ref 8.4–10.5)
Chloride: 104 mEq/L (ref 96–112)
Creatinine, Ser: 0.93 mg/dL (ref 0.40–1.20)
GFR: 84.9 mL/min (ref >60.00)
Glucose, Bld: 80 mg/dL (ref 70–99)
Potassium: 4.2 mEq/L (ref 3.5–5.1)
Sodium: 139 mEq/L (ref 135–145)
Total Bilirubin: 0.8 mg/dL (ref 0.2–1.2)
Total Protein: 6.5 g/dL (ref 6.0–8.3)

## 2022-06-21 NOTE — Addendum Note (Signed)
Addended by: Caleen Jobs B on: 06/21/2022 01:07 PM   Modules accepted: Orders

## 2022-06-25 ENCOUNTER — Ambulatory Visit: Payer: Self-pay | Admitting: Advanced Practice Midwife

## 2022-06-28 ENCOUNTER — Other Ambulatory Visit (INDEPENDENT_AMBULATORY_CARE_PROVIDER_SITE_OTHER): Payer: 59

## 2022-06-28 DIAGNOSIS — E669 Obesity, unspecified: Secondary | ICD-10-CM | POA: Diagnosis not present

## 2022-06-28 LAB — COMPREHENSIVE METABOLIC PANEL
ALT: 10 U/L (ref 0–35)
AST: 17 U/L (ref 0–37)
Albumin: 4.5 g/dL (ref 3.5–5.2)
Alkaline Phosphatase: 57 U/L (ref 39–117)
BUN: 13 mg/dL (ref 6–23)
CO2: 26 mEq/L (ref 19–32)
Calcium: 9.1 mg/dL (ref 8.4–10.5)
Chloride: 104 mEq/L (ref 96–112)
Creatinine, Ser: 1.07 mg/dL (ref 0.40–1.20)
GFR: 71.74 mL/min (ref >60.00)
Glucose, Bld: 74 mg/dL (ref 70–99)
Potassium: 4.4 mEq/L (ref 3.5–5.1)
Sodium: 139 mEq/L (ref 135–145)
Total Bilirubin: 0.5 mg/dL (ref 0.2–1.2)
Total Protein: 6.7 g/dL (ref 6.0–8.3)

## 2022-06-28 NOTE — Addendum Note (Signed)
Addended by: Caleen Jobs B on: 06/28/2022 10:16 PM   Modules accepted: Orders

## 2022-07-03 ENCOUNTER — Ambulatory Visit (INDEPENDENT_AMBULATORY_CARE_PROVIDER_SITE_OTHER): Payer: 59 | Admitting: Psychology

## 2022-07-03 DIAGNOSIS — F4323 Adjustment disorder with mixed anxiety and depressed mood: Secondary | ICD-10-CM | POA: Diagnosis not present
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
## 2022-07-05 ENCOUNTER — Other Ambulatory Visit (INDEPENDENT_AMBULATORY_CARE_PROVIDER_SITE_OTHER): Payer: 59

## 2022-07-05 DIAGNOSIS — E669 Obesity, unspecified: Secondary | ICD-10-CM | POA: Diagnosis not present

## 2022-07-05 LAB — COMPREHENSIVE METABOLIC PANEL
ALT: 9 U/L (ref 0–35)
AST: 16 U/L (ref 0–37)
Albumin: 4.4 g/dL (ref 3.5–5.2)
Alkaline Phosphatase: 55 U/L (ref 39–117)
BUN: 10 mg/dL (ref 6–23)
CO2: 28 mEq/L (ref 19–32)
Calcium: 8.9 mg/dL (ref 8.4–10.5)
Chloride: 105 mEq/L (ref 96–112)
Creatinine, Ser: 1.03 mg/dL (ref 0.40–1.20)
GFR: 75.09 mL/min (ref >60.00)
Glucose, Bld: 73 mg/dL (ref 70–99)
Potassium: 3.9 mEq/L (ref 3.5–5.1)
Sodium: 141 mEq/L (ref 135–145)
Total Bilirubin: 0.7 mg/dL (ref 0.2–1.2)
Total Protein: 6.6 g/dL (ref 6.0–8.3)

## 2022-07-05 NOTE — Addendum Note (Signed)
Addended by: Caleen Jobs B on: 07/05/2022 01:40 PM   Modules accepted: Orders

## 2022-07-08 ENCOUNTER — Other Ambulatory Visit (HOSPITAL_BASED_OUTPATIENT_CLINIC_OR_DEPARTMENT_OTHER): Payer: Self-pay

## 2022-07-08 MED ORDER — NA SULFATE-K SULFATE-MG SULF 17.5-3.13-1.6 GM/177ML PO SOLN
ORAL | 0 refills | Status: DC
  Filled 2022-07-08: qty 354, 1d supply, fill #0

## 2022-07-11 ENCOUNTER — Other Ambulatory Visit (INDEPENDENT_AMBULATORY_CARE_PROVIDER_SITE_OTHER): Payer: 59

## 2022-07-11 DIAGNOSIS — E669 Obesity, unspecified: Secondary | ICD-10-CM

## 2022-07-11 LAB — COMPREHENSIVE METABOLIC PANEL
ALT: 9 U/L (ref 0–35)
AST: 15 U/L (ref 0–37)
Albumin: 4.3 g/dL (ref 3.5–5.2)
Alkaline Phosphatase: 47 U/L (ref 39–117)
BUN: 10 mg/dL (ref 6–23)
CO2: 25 mEq/L (ref 19–32)
Calcium: 9 mg/dL (ref 8.4–10.5)
Chloride: 106 mEq/L (ref 96–112)
Creatinine, Ser: 0.92 mg/dL (ref 0.40–1.20)
GFR: 85.97 mL/min (ref >60.00)
Glucose, Bld: 81 mg/dL (ref 70–99)
Potassium: 4.1 mEq/L (ref 3.5–5.1)
Sodium: 140 mEq/L (ref 135–145)
Total Bilirubin: 0.5 mg/dL (ref 0.2–1.2)
Total Protein: 6.5 g/dL (ref 6.0–8.3)

## 2022-07-11 NOTE — Addendum Note (Signed)
Addended by: Caleen Jobs B on: 07/11/2022 12:40 PM   Modules accepted: Orders

## 2022-07-12 ENCOUNTER — Other Ambulatory Visit: Payer: 59

## 2022-07-17 ENCOUNTER — Ambulatory Visit: Payer: Self-pay | Admitting: Psychology

## 2022-07-18 DIAGNOSIS — K644 Residual hemorrhoidal skin tags: Secondary | ICD-10-CM | POA: Diagnosis not present

## 2022-07-18 DIAGNOSIS — K625 Hemorrhage of anus and rectum: Secondary | ICD-10-CM | POA: Diagnosis not present

## 2022-07-18 DIAGNOSIS — K921 Melena: Secondary | ICD-10-CM | POA: Diagnosis not present

## 2022-07-18 LAB — HM COLONOSCOPY

## 2022-07-19 ENCOUNTER — Other Ambulatory Visit (HOSPITAL_BASED_OUTPATIENT_CLINIC_OR_DEPARTMENT_OTHER): Payer: Self-pay

## 2022-07-19 ENCOUNTER — Encounter: Payer: Self-pay | Admitting: Family Medicine

## 2022-07-19 ENCOUNTER — Ambulatory Visit (INDEPENDENT_AMBULATORY_CARE_PROVIDER_SITE_OTHER): Payer: 59 | Admitting: Family Medicine

## 2022-07-19 ENCOUNTER — Telehealth: Payer: Self-pay | Admitting: Family Medicine

## 2022-07-19 VITALS — BP 112/70 | HR 74 | Temp 98.2°F | Ht 62.0 in | Wt 167.0 lb

## 2022-07-19 DIAGNOSIS — K625 Hemorrhage of anus and rectum: Secondary | ICD-10-CM

## 2022-07-19 DIAGNOSIS — K644 Residual hemorrhoidal skin tags: Secondary | ICD-10-CM

## 2022-07-19 DIAGNOSIS — K649 Unspecified hemorrhoids: Secondary | ICD-10-CM

## 2022-07-19 MED ORDER — HYDROCORTISONE ACETATE 25 MG RE SUPP
25.0000 mg | Freq: Two times a day (BID) | RECTAL | 0 refills | Status: DC | PRN
  Filled 2022-07-19: qty 12, 6d supply, fill #0

## 2022-07-19 MED ORDER — HYDROCORTISONE (PERIANAL) 2.5 % EX CREA
TOPICAL_CREAM | CUTANEOUS | 0 refills | Status: DC
  Filled 2022-07-19: qty 28, 14d supply, fill #0

## 2022-07-19 MED ORDER — NITROGLYCERIN 0.4 % RE OINT
TOPICAL_OINTMENT | RECTAL | 0 refills | Status: DC
  Filled 2022-07-19: qty 30, 30d supply, fill #0

## 2022-07-19 NOTE — Patient Instructions (Signed)
Try to drink 55-60 oz of water daily outside of exercise.  Take Metamucil or Benefiber daily.  Don't fill the ointment if too expensive. The cream and suppository should be affordable.   Let us know if you need anything.

## 2022-07-19 NOTE — Progress Notes (Signed)
Chief Complaint  Patient presents with   Hemorrhoids    Barbara Lutz is a 27 y.o. female here for a skin complaint.  Duration: several months Location: bottom Pruritic? No Painful? Yes Drainage? No New soaps/lotions/topicals/detergents? No Other associated symptoms: bleeding Therapies tried thus far: Preparation H Drinks around 1 L water daily. Sometimes will have straining.   Past Medical History:  Diagnosis Date   Anxiety    COVID-19    Depression    Depression, major, recurrent, moderate ([place]) 05/25/2012   History of pyelonephritis 07/23/2018   Mucocele of lip 01/2020   Obesity (BMI 30-39.9) 07/23/2018   [facility] bariat[facility] counseling   Pityriasis versicolor 03/19/2021   Recurrent UTI 07/23/2018   Thyroid disorder     BP 112/70 (BP Location: Left Arm, Patient Position: Sitting, Cuff Size: Normal)   Pulse 74   Temp 98.2 F (36.8 C) (Oral)   Ht 5\' 2"  (1.575 m)   Wt 167 lb (75.8 kg)   SpO2 99%   BMI 30.54 kg/m  Gen: awake, alert, appearing stated age Lungs: No accessory muscle use Skin: Examined in presence of female chaperone. 2 ext hemorrhoids noted on L side of anal opening with mild ttp. No thrombosis. No drainage, erythema, fluctuance, excoriation Psych: Age appropriate judgment and insight  External hemorrhoid - Plan: hydrocortisone (ANUSOL-HC) 25 MG suppository, hydrocortisone 2.5 % cream, Nitroglycerin 0.4 % OINT  Prn tx as above. Needs to improve water intake. Consider fiber supplement. I am not convinced she needs a Psychologist, sport and exercise yet. Don't fill ointment if too expensive.  F/u prn. The patient voiced understanding and agreement to the plan.  [place], DO 07/19/22 12:49 PM

## 2022-07-19 NOTE — Telephone Encounter (Signed)
Please place order for [facility]

## 2022-07-22 ENCOUNTER — Telehealth: Payer: Self-pay | Admitting: Family Medicine

## 2022-07-22 NOTE — Telephone Encounter (Signed)
Patient is down to last pill of her wellbutrin (tomorrow's dose) and wants to know if she should continue on it. Please advise patient and send rx to [place].

## 2022-07-23 ENCOUNTER — Other Ambulatory Visit: Payer: Self-pay

## 2022-07-23 ENCOUNTER — Other Ambulatory Visit (HOSPITAL_BASED_OUTPATIENT_CLINIC_OR_DEPARTMENT_OTHER): Payer: Self-pay

## 2022-07-23 DIAGNOSIS — F411 Generalized anxiety disorder: Secondary | ICD-10-CM

## 2022-07-23 DIAGNOSIS — Z716 Tobacco abuse counseling: Secondary | ICD-10-CM

## 2022-07-23 MED ORDER — BUPROPION HCL ER (SR) 150 MG PO TB12
150.0000 mg | ORAL_TABLET | Freq: Two times a day (BID) | ORAL | 1 refills | Status: DC
  Filled 2022-07-23: qty 180, 90d supply, fill #0

## 2022-07-23 NOTE — Telephone Encounter (Signed)
Medication sent..

## 2022-07-25 ENCOUNTER — Other Ambulatory Visit: Payer: Self-pay | Admitting: Family Medicine

## 2022-07-25 DIAGNOSIS — E669 Obesity, unspecified: Secondary | ICD-10-CM

## 2022-07-25 NOTE — Progress Notes (Signed)
Future orders for CPE/f/u tomorrow. Cbc, cmp, lipids, tsh

## 2022-07-26 ENCOUNTER — Encounter: Payer: Self-pay | Admitting: Family Medicine

## 2022-07-26 ENCOUNTER — Ambulatory Visit (INDEPENDENT_AMBULATORY_CARE_PROVIDER_SITE_OTHER): Payer: 59 | Admitting: Family Medicine

## 2022-07-26 VITALS — BP 112/71 | HR 75 | Temp 98.0°F | Resp 16 | Ht 63.0 in | Wt 161.0 lb

## 2022-07-26 DIAGNOSIS — Z0000 Encounter for general adult medical examination without abnormal findings: Secondary | ICD-10-CM

## 2022-07-26 DIAGNOSIS — E669 Obesity, unspecified: Secondary | ICD-10-CM

## 2022-07-26 LAB — COMPREHENSIVE METABOLIC PANEL
ALT: 8 U/L (ref 0–35)
AST: 15 U/L (ref 0–37)
Albumin: 4.4 g/dL (ref 3.5–5.2)
Alkaline Phosphatase: 50 U/L (ref 39–117)
BUN: 12 mg/dL (ref 6–23)
CO2: 28 mEq/L (ref 19–32)
Calcium: 8.9 mg/dL (ref 8.4–10.5)
Chloride: 104 mEq/L (ref 96–112)
Creatinine, Ser: 0.94 mg/dL (ref 0.40–1.20)
GFR: 83.76 mL/min (ref >60.00)
Glucose, Bld: 67 mg/dL — ABNORMAL LOW (ref 70–99)
Potassium: 3.9 mEq/L (ref 3.5–5.1)
Sodium: 140 mEq/L (ref 135–145)
Total Bilirubin: 0.5 mg/dL (ref 0.2–1.2)
Total Protein: 6.4 g/dL (ref 6.0–8.3)

## 2022-07-26 LAB — CBC WITH DIFFERENTIAL/PLATELET
Basophils Absolute: 0 10*3/uL (ref 0.0–0.1)
Basophils Relative: 0.4 % (ref 0.0–3.0)
Eosinophils Absolute: 0.1 10*3/uL (ref 0.0–0.7)
Eosinophils Relative: 1.4 % (ref 0.0–5.0)
HCT: 40.6 % (ref 36.0–46.0)
Hemoglobin: 13.4 g/dL (ref 12.0–15.0)
Lymphocytes Relative: 28.5 % (ref 12.0–46.0)
Lymphs Abs: 1.2 10*3/uL (ref 0.7–4.0)
MCHC: 33.1 g/dL (ref 30.0–36.0)
MCV: 93.1 fl (ref 78.0–100.0)
Monocytes Absolute: 0.3 10*3/uL (ref 0.1–1.0)
Monocytes Relative: 8.1 % (ref 3.0–12.0)
Neutro Abs: 2.6 10*3/uL (ref 1.4–7.7)
Neutrophils Relative %: 61.6 % (ref 43.0–77.0)
Platelets: 202 10*3/uL (ref 150.0–400.0)
RBC: 4.36 Mil/uL (ref 3.87–5.11)
RDW: 13.9 % (ref 11.5–15.5)
WBC: 4.1 10*3/uL (ref 4.0–10.5)

## 2022-07-26 LAB — LIPID PANEL
Cholesterol: 137 mg/dL (ref 0–200)
HDL: 53.8 mg/dL (ref >39.00)
LDL Cholesterol: 75 mg/dL (ref 0–99)
NonHDL: 83.49
Total CHOL/HDL Ratio: 3
Triglycerides: 41 mg/dL (ref 0.0–149.0)
VLDL: 8.2 mg/dL (ref 0.0–40.0)

## 2022-07-26 LAB — TSH: TSH: 0.86 u[IU]/mL (ref 0.35–5.50)

## 2022-07-26 NOTE — Progress Notes (Signed)
Complete physical exam  Patient: Barbara Lutz   DOB: 11-10-1995   27 y.o. Female  MRN: [id number]  Subjective:    CC: CPE   Barbara Lutz is a 27 y.o. female who presents today for a complete physical exam. She reports consuming a general and low portions, trying to avoid sweet . Home exercise routine includes home workouts 30 mins/day. She generally feels well. She reports sleeping well. She does not have additional problems to discuss today.   Wt Readings from Last 3 Encounters:  07/26/22 161 lb (73 kg)  07/19/22 167 lb (75.8 kg)  05/24/22 165 lb (74.8 kg)      Most recent fall risk assessment:    07/26/2022   12:53 PM  [place] in the past year? 0  Number falls in past yr: 0  Injury with Fall? 0     Most recent depression screenings:    07/26/2022   12:40 PM 05/24/2022   12:45 PM  PHQ 2/9 Scores  PHQ - 2 Score 0 2  PHQ- 9 Score 0 11       Patient Care Team: Terrilyn Saver, NP as PCP - General (Family Medicine) [facility], [facility]   Outpatient Medications Prior to Visit  Medication Sig   buPROPion (WELLBUTRIN SR) 150 MG 12 hr tablet Take 1 tablet (150 mg total) by mouth 2 (two) times daily. Take one tablet (150 mg) by mouth daily for the first 3-5 days, then increase to one tablet twice daily and continue for 7-12 weeks. Take evening dose no later than 6pm. Stop smoking after the first 5-7 days of treatment.   citalopram (CELEXA) 40 MG tablet Take 1 tablet (40 mg total) by mouth daily.   hydrocortisone (ANUSOL-HC) 25 MG suppository Place 1 suppository (25 mg total) rectally 2 (two) times daily as needed for hemorrhoids or anal itching.   hydrocortisone (PROCTO-MED HC) 2.5 % rectal cream Apply twice a day as needed   levonorgestrel (MIRENA) 20 MCG/DAY IUD 1 each by Intrauterine route once.   Nitroglycerin 0.4 % OINT Apply 2 times daily as needed.   pantoprazole (PROTONIX) 40 MG tablet Take 1 tablet (40 mg total) by mouth daily.    tirzepatide (ZEPBOUND) 2.5 MG/0.5ML Pen Inject 2.5 mg into the skin once a week.   No facility-administered medications prior to visit.    ROS All review of systems negative except what is listed in the HPI        Objective:     BP 112/71   Pulse 75   Temp 98 F (36.7 C)   Resp 16   Ht 5' 3"$  (1.6 m)   Wt 161 lb (73 kg)   SpO2 98%   BMI 28.52 kg/m    Physical Exam Vitals reviewed.  Constitutional:      General: She is not in acute distress.    Appearance: Normal appearance. She is not ill-appearing.  HENT:     Head: Normocephalic and atraumatic.     Right Ear: Tympanic membrane normal.     Left Ear: Tympanic membrane normal.     Nose: Nose normal.     Mouth/Throat:     Mouth: Mucous membranes are moist.     Pharynx: Oropharynx is clear.  Eyes:     Extraocular Movements: Extraocular movements intact.     Conjunctiva/sclera: Conjunctivae normal.     Pupils: Pupils are equal, round, and reactive to light.  Neck:     Vascular: No  carotid bruit.  Cardiovascular:     Rate and Rhythm: Normal rate and regular rhythm.     Pulses: Normal pulses.     Heart sounds: Normal heart sounds.  Pulmonary:     Effort: Pulmonary effort is normal.     Breath sounds: Normal breath sounds.  Abdominal:     General: Abdomen is flat. Bowel sounds are normal. There is no distension.     Palpations: Abdomen is soft. There is no mass.     Tenderness: There is no abdominal tenderness. There is no right CVA tenderness, left CVA tenderness, guarding or rebound.  Genitourinary:    Comments: Deferred exam Musculoskeletal:        General: Normal range of motion.     Cervical back: Normal range of motion and neck supple. No tenderness.     Right lower leg: No edema.     Left lower leg: No edema.  Lymphadenopathy:     Cervical: No cervical adenopathy.  Skin:    General: Skin is warm and dry.     Capillary Refill: Capillary refill takes less than 2 seconds.  Neurological:     General: No  focal deficit present.     Mental Status: She is alert and oriented to person, place, and time. Mental status is at baseline.  Psychiatric:        Mood and Affect: Mood normal.        Behavior: Behavior normal.        Thought Content: Thought content normal.        Judgment: Judgment normal.       No results found for any visits on 07/26/22.     Assessment & Plan:    Routine Health Maintenance and Physical Exam  Immunization History  Administered Date(s) Administered   DTaP 03/19/1996, 05/17/1996, 08/03/1996, 08/19/1997, 09/25/2000   DTaP / HiB 03/19/1996, 05/17/1996, 08/03/1996, 02/01/1997   HPV Quadrivalent 03/30/2009, 03/07/2010, 07/03/2010   Hepatitis A, Ped/Adol-2 Dose 03/10/2008, 03/30/2009   Hepatitis B, PED/ADOLESCENT 10/28/1995, 03/19/1996, 11/25/1996   IPV 03/19/1996, 05/17/1996, 08/19/1997, 09/25/2000   Influenza Inj Mdck Quad Pf 02/15/2018   Influenza, Seasonal, Injecte, Preservative Fre 04/19/2016   Influenza,inj,Quad PF,6+ Mos 04/19/2016, 04/19/2016, 03/18/2017, 03/22/2019, 03/06/2022   Influenza,inj,quad, With Preservative 03/10/2008, 03/30/2009, 03/07/2010   Influenza-Unspecified 03/10/2008, 03/30/2009, 03/07/2010, 04/19/2016, 03/18/2017, 03/22/2019, 03/12/2021   MMR 02/01/1997, 09/25/2000   Meningococcal B, Unspecified 03/10/2008   Meningococcal Conjugate 03/10/2008, 02/14/2014   Moderna Sars-Covid-2 Vaccination 02/05/2020, 03/04/2020   Pneumococcal-Unspecified 07/09/1999   Td 02/06/2007, 03/18/2017   Tdap 02/06/2007   Varicella 02/01/1997, 03/10/2008    Health Maintenance  Topic Date Due   PAP-Cervical Cytology Screening  06/03/2023   PAP SMEAR-Modifier  06/03/2023   DTaP/Tdap/Td (9 - Td or Tdap) 03/19/2027   INFLUENZA VACCINE  Completed   HPV VACCINES  Completed   Hepatitis C Screening  Completed   HIV Screening  Completed   COVID-19 Vaccine  Discontinued    Discussed health benefits of physical activity, and encouraged her to engage in regular  exercise appropriate for her age and condition.  Problem List Items Addressed This Visit       Other   Obesity (BMI 30-39.9)   Other Visit Diagnoses     Annual physical exam    -  Primary     Patient came for blood work earlier today - CBC, CMP, Lipids, TSH. We will update her with results.  Doing well on all current meds. No acute concerns. Discussed health promotion and  health maintenance.     Return if symptoms worsen or fail to improve, for (schedule CPE for 1 year).     Terrilyn Saver, NP

## 2022-07-28 NOTE — Addendum Note (Signed)
Addended by: Caleen Jobs B on: 07/28/2022 09:16 PM   Modules accepted: Orders

## 2022-07-30 ENCOUNTER — Ambulatory Visit: Payer: Self-pay | Admitting: Advanced Practice Midwife

## 2022-08-05 IMAGING — MG MM DIGITAL SCREENING BILAT W/ TOMO AND CAD
6 of 12 series · 6 of 36 positions shown · non-contrast
Comparison: None available.

CLINICAL DATA: Screening.

EXAM:
DIGITAL SCREENING BILATERAL MAMMOGRAM WITH TOMOSYNTHESIS AND CAD
TECHNIQUE: Bilateral screening digital craniocaudal and mediolateral oblique
mammograms were obtained. Bilateral screening digital breast
tomosynthesis was performed. The images were evaluated with
computer-aided detection.

[R CC synth-2D]
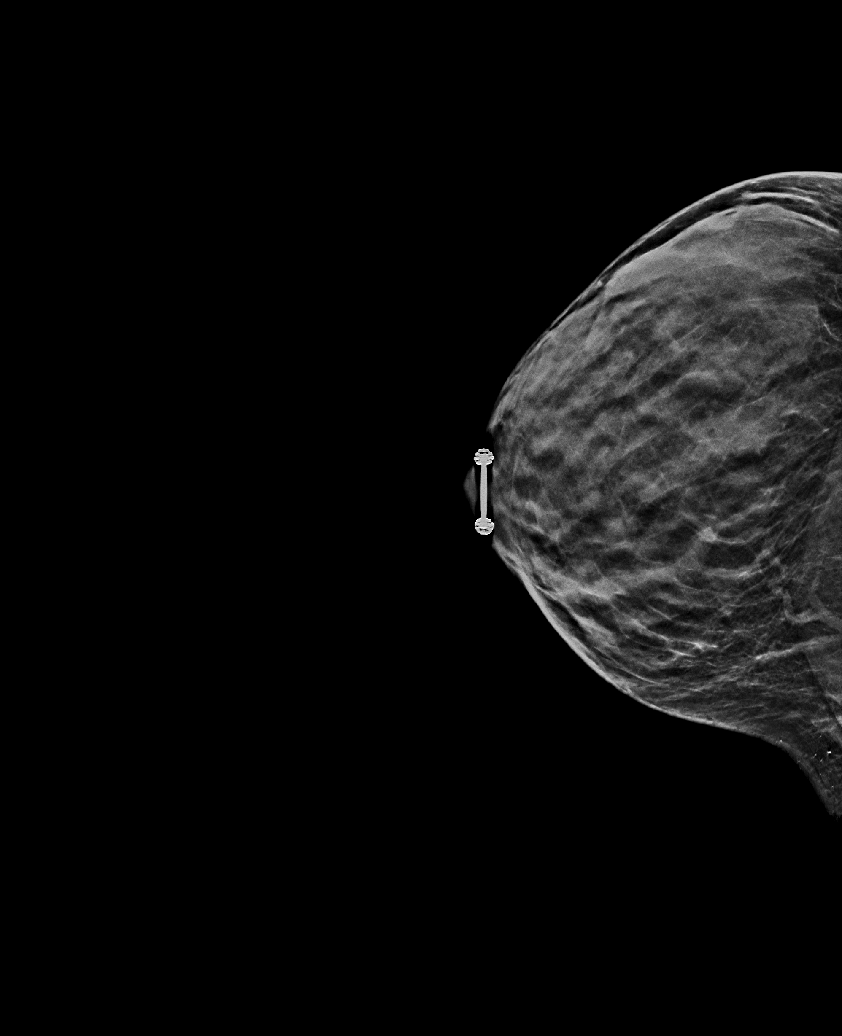

[L MLO synth-2D (1 of 2)]
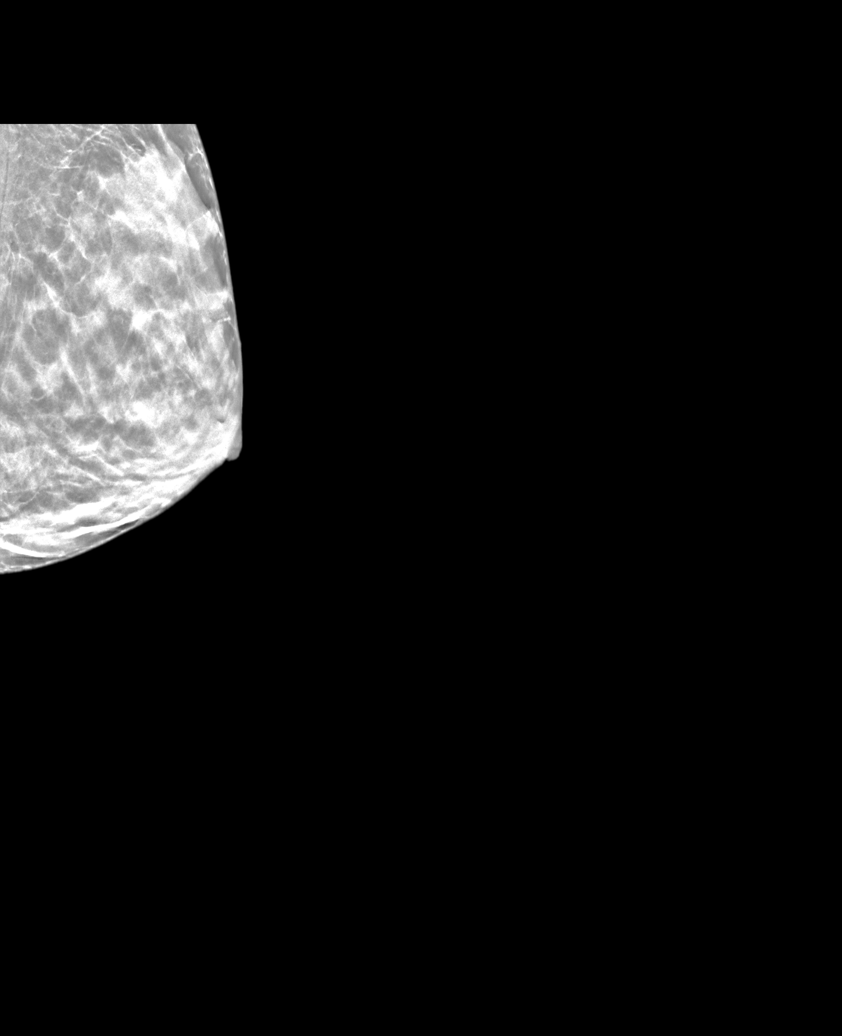

[R MLO synth-2D (1 of 2)]
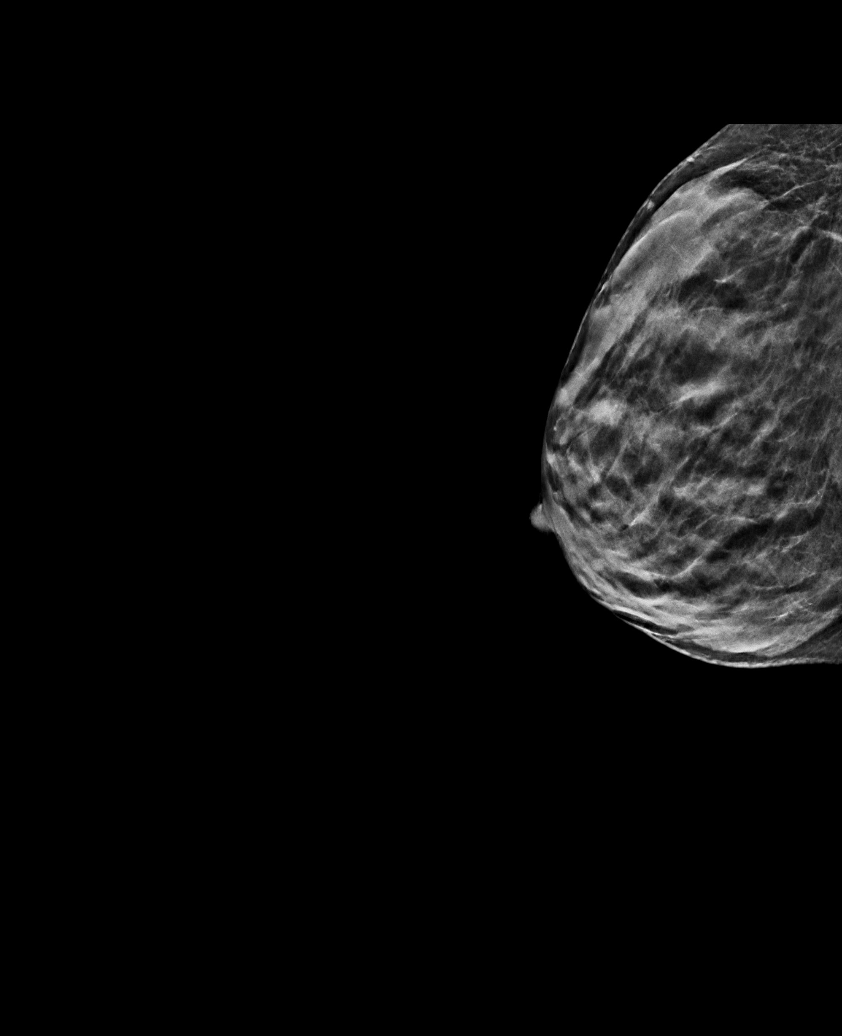

[L MLO synth-2D (2 of 2)]
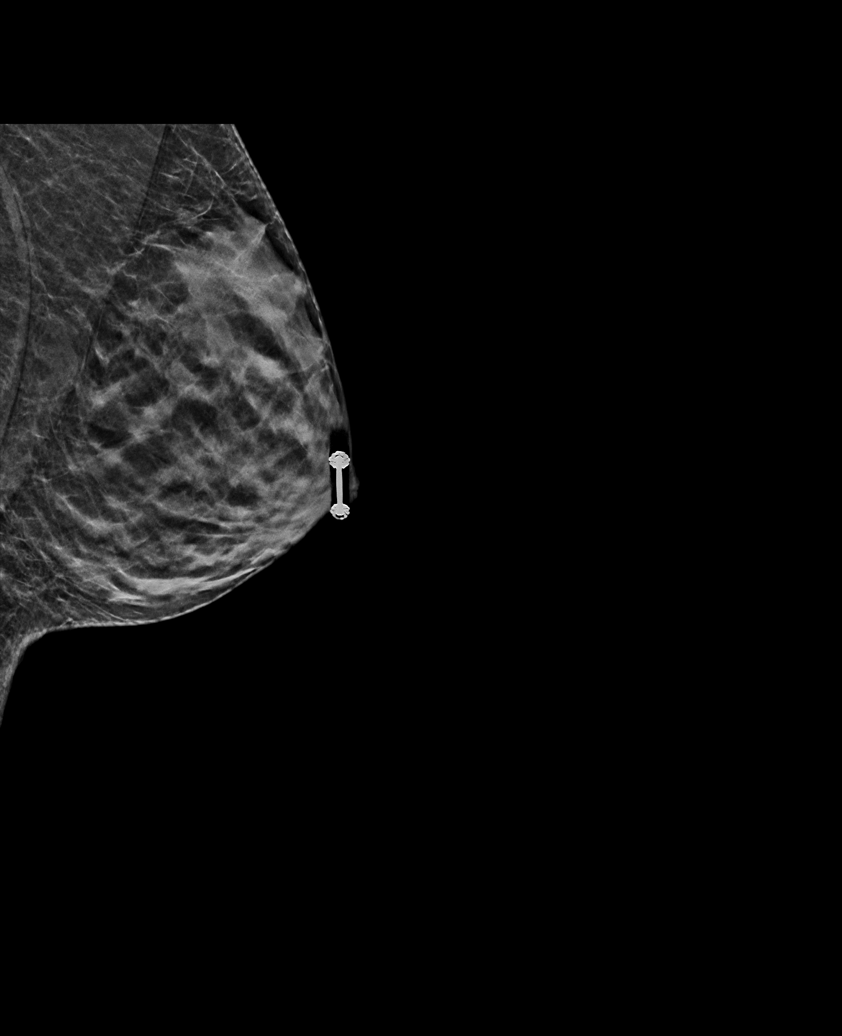

[L CC synth-2D]
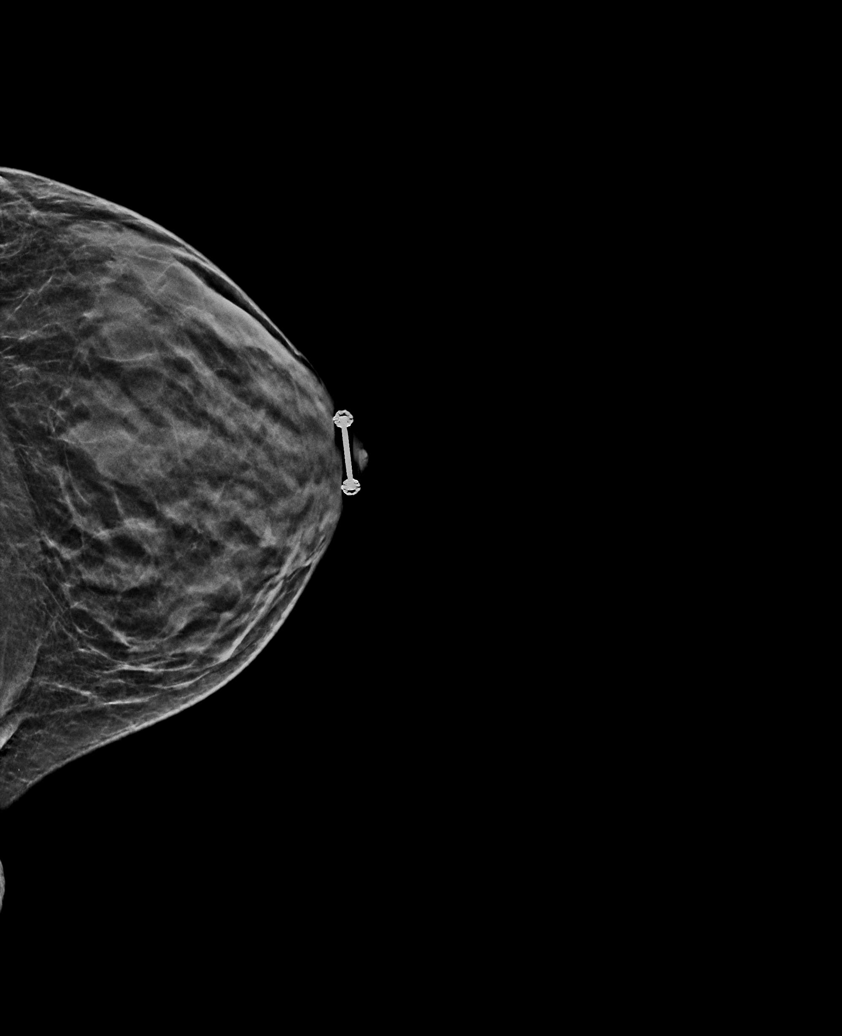

[R MLO synth-2D (2 of 2)]
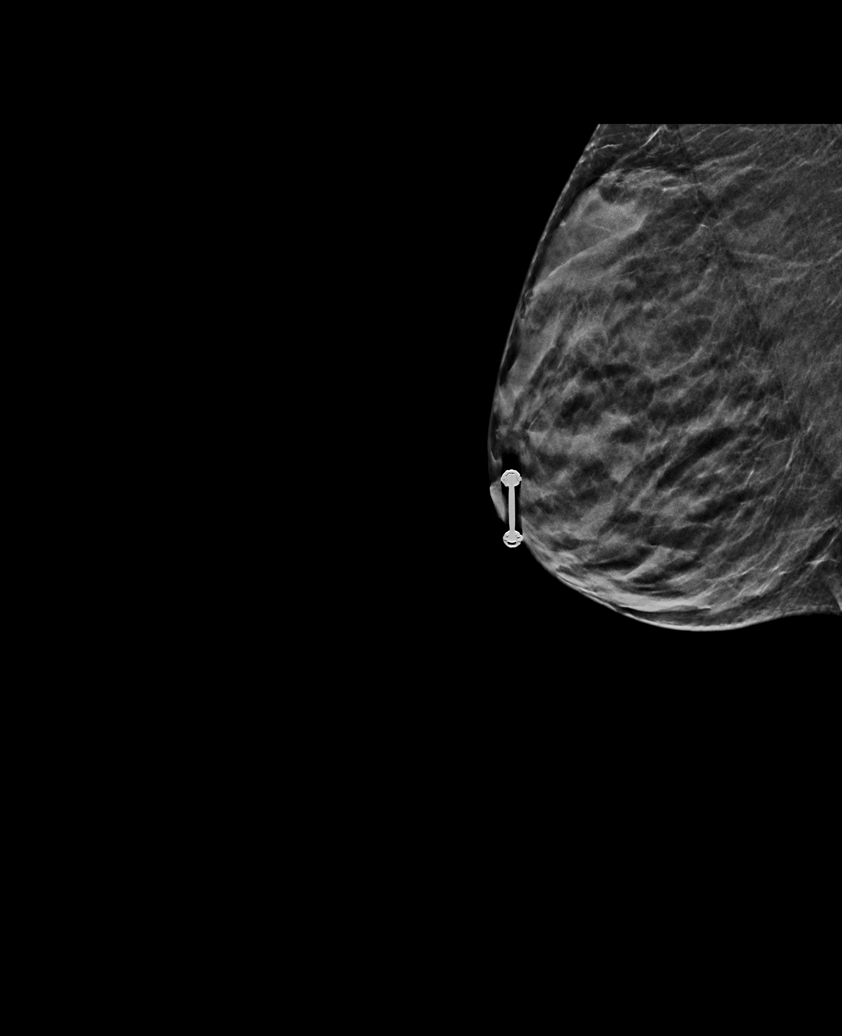

[6 of 36 positions shown; findings below may reference images not displayed]

ACR Breast Density Category d: The breast tissue is extremely dense,
which lowers the sensitivity of mammography.
FINDINGS: There are no findings suspicious for malignancy.
IMPRESSION: No mammographic evidence of malignancy. A result letter of this
screening mammogram will be mailed directly to the patient.

RECOMMENDATION:
1. Per order requisition, patient has a family history of breast
cancer. Recommend clinical evaluation to the determine lifetime risk
breast cancer. If lifetime risk is elevated (greater than 20%), then
recommend initiation of annual screening mammography at the age of
30 and initiation of annual screening MRI at the age of 25. If
patient is of average lifetime risk of breast cancer, recommend
initiation of annual screening mammography at the age of 40.
2. The American Cancer Society recommends annual MRI and mammography
in patients with an estimated lifetime risk of developing breast
cancer greater than 20 - 25%, or who are known or suspected to be
positive for the breast cancer gene.

BI-RADS CATEGORY  1: Negative.

## 2022-08-06 ENCOUNTER — Other Ambulatory Visit: Payer: Self-pay | Admitting: Family Medicine

## 2022-08-06 ENCOUNTER — Other Ambulatory Visit: Payer: Self-pay

## 2022-08-06 ENCOUNTER — Other Ambulatory Visit (HOSPITAL_BASED_OUTPATIENT_CLINIC_OR_DEPARTMENT_OTHER): Payer: Self-pay

## 2022-08-06 DIAGNOSIS — F411 Generalized anxiety disorder: Secondary | ICD-10-CM

## 2022-08-06 DIAGNOSIS — F32A Depression, unspecified: Secondary | ICD-10-CM

## 2022-08-06 MED ORDER — CITALOPRAM HYDROBROMIDE 40 MG PO TABS
40.0000 mg | ORAL_TABLET | Freq: Every day | ORAL | 0 refills | Status: DC
  Filled 2022-08-06: qty 90, 90d supply, fill #0

## 2022-08-08 ENCOUNTER — Telehealth: Payer: Self-pay | Admitting: Family Medicine

## 2022-08-08 DIAGNOSIS — K644 Residual hemorrhoidal skin tags: Secondary | ICD-10-CM

## 2022-08-08 NOTE — Telephone Encounter (Signed)
Pt stated needing refill on her Rx hydrocortisone (ANUSOL-HC) 25 MG suppository [id number] sent to her pharmacy [facility]. Please advise.

## 2022-08-09 ENCOUNTER — Other Ambulatory Visit (HOSPITAL_BASED_OUTPATIENT_CLINIC_OR_DEPARTMENT_OTHER): Payer: Self-pay

## 2022-08-09 MED ORDER — HYDROCORTISONE ACETATE 25 MG RE SUPP
25.0000 mg | Freq: Two times a day (BID) | RECTAL | 3 refills | Status: DC | PRN
  Filled 2022-08-09: qty 12, 6d supply, fill #0
  Filled 2022-12-16: qty 12, 6d supply, fill #1

## 2022-08-09 NOTE — Telephone Encounter (Signed)
Pt is needing refill ASAP. Please advise.

## 2022-08-13 ENCOUNTER — Telehealth: Payer: Self-pay | Admitting: Family Medicine

## 2022-08-13 ENCOUNTER — Other Ambulatory Visit (HOSPITAL_BASED_OUTPATIENT_CLINIC_OR_DEPARTMENT_OTHER): Payer: Self-pay

## 2022-08-13 DIAGNOSIS — E669 Obesity, unspecified: Secondary | ICD-10-CM

## 2022-08-13 MED ORDER — ZEPBOUND 2.5 MG/0.5ML ~~LOC~~ SOAJ
2.5000 mg | SUBCUTANEOUS | 2 refills | Status: DC
  Filled 2022-08-13: qty 2, 28d supply, fill #0

## 2022-08-13 NOTE — Addendum Note (Signed)
Addended by: Caleen Jobs B on: 08/13/2022 01:58 PM   Modules accepted: Orders

## 2022-08-13 NOTE — Telephone Encounter (Signed)
Pt notified that rx has been sent in.

## 2022-08-13 NOTE — Telephone Encounter (Signed)
Prescription Request  08/13/2022  Is this a "Controlled Substance" medicine? No  LOV: 07/26/2022  What is the name of the medication or equipment?  tirzepatide (ZEPBOUND) 2.5 MG/0.5ML Pen  Have you contacted your pharmacy to request a refill? No   Which pharmacy would you like this sent to?  [facility] [address], [place] [address] Phone: [phone] Fax: [phone]    Patient notified that their request is being sent to the clinical staff for review and that they should receive a response within 2 business days.   Please advise at [facility] [phone]

## 2022-08-15 ENCOUNTER — Encounter: Payer: Self-pay | Admitting: Family Medicine

## 2022-08-15 ENCOUNTER — Other Ambulatory Visit (HOSPITAL_BASED_OUTPATIENT_CLINIC_OR_DEPARTMENT_OTHER): Payer: Self-pay

## 2022-08-15 DIAGNOSIS — E669 Obesity, unspecified: Secondary | ICD-10-CM

## 2022-08-15 MED ORDER — ZEPBOUND 5 MG/0.5ML ~~LOC~~ SOAJ
5.0000 mg | SUBCUTANEOUS | 1 refills | Status: DC
  Filled 2022-08-15: qty 6, 84d supply, fill #0

## 2022-08-16 ENCOUNTER — Other Ambulatory Visit (HOSPITAL_BASED_OUTPATIENT_CLINIC_OR_DEPARTMENT_OTHER): Payer: Self-pay

## 2022-08-20 ENCOUNTER — Ambulatory Visit: Payer: Self-pay | Admitting: Advanced Practice Midwife

## 2022-08-27 ENCOUNTER — Ambulatory Visit (INDEPENDENT_AMBULATORY_CARE_PROVIDER_SITE_OTHER): Payer: 59 | Admitting: Psychology

## 2022-08-27 DIAGNOSIS — F4323 Adjustment disorder with mixed anxiety and depressed mood: Secondary | ICD-10-CM

## 2022-08-27 NOTE — Progress Notes (Signed)
[place] Counselor/Therapist Progress Note  Patient ID: Deyani Spainhower, MRN: [id number],    Date: 08/27/2022  Time Spent: 5:00pm - 5:55pm    55 minutes   Treatment Type: Individual Therapy  Reported Symptoms: anxiety  Mental Status Exam: Appearance:  Casual     Behavior: Appropriate  Motor: Normal  Speech/Language:  Normal Rate  Affect: Appropriate  Mood: normal  Thought process: normal  Thought content:   WNL  Sensory/Perceptual disturbances:   WNL  Orientation: oriented to person, place, time/date, and situation  Attention: Good  Concentration: Good  Memory: WNL  Fund of knowledge:  Good  Insight:   Good  Judgment:  Good  Impulse Control: Good   Risk Assessment: Danger to Self:  No Self-injurious Behavior: No Danger to Others: No Duty to Warn:no Physical Aggression / Violence:No  Access to Firearms a concern: No  Gang Involvement:No   Subjective:  Pt present for face-to-face individual therapy via video Webex.  Pt consents to telehealth video session due to COVID 19 pandemic. Location of pt: home Location of therapist: home office.  Pt talked about not feeling well the past few weeks.  She thinks it is from her IUD.   Pt started a new part time job.  The job has been going well but pt does not know if she wants to continue working there bc she is not getting enough hours. Pt has applied to [employer] and may start that in the next month.  Pt has a lot of stress bc she really likes her current job at [employer] and feels very comfortable there.  Pt is nervous about the decision to change. Worked with pt on decision analysis.   Pt has been feeling a lot of anxiety.  Addressed pt's anxiety and worked on calming strategies and thought reframing.    Provided supportive therapy.    Interventions: Cognitive Behavioral Therapy and Insight-Oriented  Diagnosis:  F43.23  Plan of Care: Recommend ongoing therapy.   Pt participated in setting treatment  goals.   Pt wants to improve coping skills and cope without using weed or getting angry.   Pt wants to manage stress better.    Plan to meet every two weeks.    Treatment Plan (Treatment Plan Target Date:  02/07/2023) Client Abilities/Strengths  Pt is bright, engaging, and motivated for therapy.  Client Treatment Preferences  Individual therapy.  Client Statement of Needs  Improve copings skills and understand herself better. Improve self esteem.  Symptoms  Depressed or irritable mood. Excessive and/or unrealistic worry that is difficult to control occurring more days than not for at least 6 months about a number of events or activities. Hypervigilance (e.g., feeling constantly on edge, experiencing concentration difficulties, having trouble falling or staying asleep, exhibiting a general state of irritability). Low self-esteem. Problems Addressed  Unipolar Depression, Anxiety Goals 1. Alleviate depressive symptoms and return to previous level of effective functioning. 2. Appropriately grieve the loss in order to normalize mood and to return to previously adaptive level of functioning. Objective Learn and implement behavioral strategies to overcome depression. Target Date: 2023-02-07 Frequency: Biweekly  Progress: 10 Modality: individual  Related Interventions Assist the client in developing skills that increase the likelihood of deriving pleasure from behavioral activation (e.g., assertiveness skills, developing an exercise plan, less internal/more external focus, increased social involvement); reinforce success. Engage the client in "behavioral activation," increasing his/her activity level and contact with sources of reward, while identifying processes that inhibit activation. use behavioral techniques  such as instruction, rehearsal, role-playing, role reversal, as needed, to facilitate activity in the client's daily life; reinforce success. 3. Develop healthy interpersonal relationships  that lead to the alleviation and help prevent the relapse of depression. 4. Develop healthy thinking patterns and beliefs about self, others, and the world that lead to the alleviation and help prevent the relapse of depression. 5. Enhance ability to effectively cope with the full variety of life's worries and anxieties. 6. Learn and implement coping skills that result in a reduction of anxiety and worry, and improved daily functioning. Objective Learn and implement problem-solving strategies for realistically addressing worries. Target Date: 2023-02-07 Frequency: Biweekly  Progress: 10 Modality: individual  Related Interventions Assign the client a homework exercise in which he/she problem-solves a current problem (see Mastery of Your Anxiety and Worry: Workbook by Adora Fridge and Eliot Ford or Generalized Anxiety Disorder by Eather Colas, and Eliot Ford); review, reinforce success, and provide corrective feedback toward improvement. Teach the client problem-solving strategies involving specifically defining a problem, generating options for addressing it, evaluating the pros and cons of each option, selecting and implementing an optional action, and reevaluating and refining the action. Objective Learn and implement calming skills to reduce overall anxiety and manage anxiety symptoms. Target Date: 2023-02-07 Frequency: Biweekly  Progress: 10 Modality: individual  Related Interventions Assign the client to read about progressive muscle relaxation and other calming strategies in relevant books or treatment manuals (e.g., Progressive Relaxation Training by Leroy Kennedy; Mastery of Your Anxiety and Worry: Workbook by Beckie Busing). Assign the client homework each session in which he/she practices relaxation exercises daily, gradually applying them progressively from non-anxiety-provoking to anxiety-provoking situations; review and reinforce success while providing corrective feedback toward  improvement. Teach the client calming/relaxation skills (e.g., applied relaxation, progressive muscle relaxation, cue controlled relaxation; mindful breathing; biofeedback) and how to discriminate better between relaxation and tension; teach the client how to apply these skills to his/her daily life. 7. Recognize, accept, and cope with feelings of depression. 8. Reduce overall frequency, intensity, and duration of the anxiety so that daily functioning is not impaired. 9. Resolve the core conflict that is the source of anxiety. 10. Stabilize anxiety level while increasing ability to function on a daily basis. Diagnosis F43.23 Conditions For Discharge Achievement of treatment goals and objectives   Clint Bolder, LCSW

## 2022-08-30 ENCOUNTER — Ambulatory Visit: Payer: 59 | Admitting: Obstetrics and Gynecology

## 2022-08-30 ENCOUNTER — Encounter: Payer: Self-pay | Admitting: General Practice

## 2022-08-30 ENCOUNTER — Encounter: Payer: Self-pay | Admitting: Obstetrics and Gynecology

## 2022-08-30 VITALS — BP 108/60 | HR 90 | Wt 162.0 lb

## 2022-08-30 DIAGNOSIS — Z30432 Encounter for removal of intrauterine contraceptive device: Secondary | ICD-10-CM | POA: Diagnosis not present

## 2022-08-30 NOTE — Patient Instructions (Signed)
IUD was removed today. Anticipate your cycles to return to baseline. Let us know if you want to start alternate form of contraception or need menstrual control.

## 2022-08-30 NOTE — Progress Notes (Signed)
GYNECOLOGY VISIT  Patient name: Barbara Lutz MRN [id number]  Date of birth: 01-Apr-1996 Chief Complaint:   iud removal  History:  Barbara Lutz is a 27 y.o. G0P0000 being seen today for IUD removal. Feels that menses have not been well controlled - menses every 2 weeks, feeling very tired and decreased anxiety control. IUD placed for menstrual control and contraception. Not currently sexually active and would like to proceed with removal. No new medications started and has been on injectable weight loss medication for 1.5 years already.     Past Medical History:  Diagnosis Date   Anxiety    COVID-19    Depression    Depression, major, recurrent, moderate ([place]) 05/25/2012   History of pyelonephritis 07/23/2018   Mucocele of lip 01/2020   Obesity (BMI 30-39.9) 07/23/2018   [facility]   Pityriasis versicolor 03/19/2021   Recurrent UTI 07/23/2018   Thyroid disorder     Past Surgical History:  Procedure Laterality Date   ORAL MUCOCELE EXCISION      The following portions of the patient's history were reviewed and updated as appropriate: allergies, current medications, past family history, past medical history, past social history, past surgical history and problem list.   Health Maintenance:   Last pap 05/2020 NILM    Review of Systems:  Pertinent items are noted in HPI. Comprehensive review of systems was otherwise negative.   Objective:  Physical Exam BP 108/60   Pulse 90   Wt 162 lb (73.5 kg)   LMP 08/29/2022 (Exact Date)   BMI 28.70 kg/m    Physical Exam Vitals and nursing note reviewed. Exam conducted with a chaperone present.  Constitutional:      Appearance: Normal appearance.  HENT:     Head: Normocephalic and atraumatic.  Pulmonary:     Effort: Pulmonary effort is normal.     Breath sounds: Normal breath sounds.  Genitourinary:    General: Normal vulva.     Exam position: Lithotomy position.     Vagina: Normal.     Cervix: Normal.   Skin:    General: Skin is warm and dry.  Neurological:     General: No focal deficit present.     Mental Status: She is alert.  Psychiatric:        Mood and Affect: Mood normal.        Behavior: Behavior normal.        Thought Content: Thought content normal.        Judgment: Judgment normal.     IUD Removal  Patient identified, informed consent performed, consent signed.  Patient was in the dorsal lithotomy position, normal external genitalia was noted.  A speculum was placed in the patient's vagina, normal discharge was noted, no lesions. The cervix was visualized, no lesions, no abnormal discharge.  The strings of the IUD were not visualized, so polyp forceps were introduced into the endocervical canal and the IUD was grasped and removed in its entirety. Patient tolerated the procedure well.       Assessment & Plan:   1. Encounter for IUD removal Now s/p uncomplicated IUD removal. Reviewed that menses will return to baseline and that if she needs contraception or menstrual control to let us know. Additionally, noted that IUD intended to be locally acting and that systemic symptoms may not resolve with removal of IUD and if symptoms continue, to follow up with PCP.   Routine preventative health maintenance measures emphasized.  Darliss Cheney, MD [facility] [place]

## 2022-09-16 ENCOUNTER — Other Ambulatory Visit: Payer: Self-pay | Admitting: Family Medicine

## 2022-09-16 ENCOUNTER — Ambulatory Visit (INDEPENDENT_AMBULATORY_CARE_PROVIDER_SITE_OTHER): Payer: 59 | Admitting: Psychology

## 2022-09-16 ENCOUNTER — Other Ambulatory Visit (HOSPITAL_BASED_OUTPATIENT_CLINIC_OR_DEPARTMENT_OTHER): Payer: Self-pay

## 2022-09-16 DIAGNOSIS — F4323 Adjustment disorder with mixed anxiety and depressed mood: Secondary | ICD-10-CM

## 2022-09-16 MED ORDER — PANTOPRAZOLE SODIUM 40 MG PO TBEC
40.0000 mg | DELAYED_RELEASE_TABLET | Freq: Every day | ORAL | 3 refills | Status: DC
  Filled 2022-09-16: qty 30, 30d supply, fill #0
  Filled 2022-10-30: qty 30, 30d supply, fill #1
  Filled 2022-12-05: qty 30, 30d supply, fill #2

## 2022-09-16 NOTE — Progress Notes (Signed)
[place] Counselor/Therapist Progress Note  Patient ID: Havilah Knippel, MRN: [id number],    Date: 09/16/2022  Time Spent: 5:00pm - 5:55pm    55 minutes   Treatment Type: Individual Therapy  Reported Symptoms: anxiety  Mental Status Exam: Appearance:  Casual     Behavior: Appropriate  Motor: Normal  Speech/Language:  Normal Rate  Affect: Appropriate  Mood: normal  Thought process: normal  Thought content:   WNL  Sensory/Perceptual disturbances:   WNL  Orientation: oriented to person, place, time/date, and situation  Attention: Good  Concentration: Good  Memory: WNL  Fund of knowledge:  Good  Insight:   Good  Judgment:  Good  Impulse Control: Good   Risk Assessment: Danger to Self:  No Self-injurious Behavior: No Danger to Others: No Duty to Warn:no Physical Aggression / Violence:No  Access to Firearms a concern: No  Gang Involvement:No   Subjective:  Pt present for face-to-face individual therapy via video Webex.  Pt consents to telehealth video session due to COVID 19 pandemic. Location of pt: home Location of therapist: home office.  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   She is excited that she has gotten into the program.  Pt is feeling less anxious now that she is accepted.   The program begins April 22nd.  The program will be for 6 months.  After the program pt hopes to get a [name] position at [employer].   Pt is feeling anxious about the school work.   Addressed pt's anxiety and worked on calming strategies and thought reframing.    Worked on self care strategies.  Provided supportive therapy.    Interventions: Cognitive Behavioral Therapy and Insight-Oriented  Diagnosis:  F43.23  Plan of Care: Recommend ongoing therapy.   Pt participated in setting treatment goals.   Pt wants to improve coping skills and cope without using weed or getting angry.   Pt wants to manage stress better.    Plan to meet every two  weeks.    Treatment Plan (Treatment Plan Target Date:  02/07/2023) Client Abilities/Strengths  Pt is bright, engaging, and motivated for therapy.  Client Treatment Preferences  Individual therapy.  Client Statement of Needs  Improve copings skills and understand herself better. Improve self esteem.  Symptoms  Depressed or irritable mood. Excessive and/or unrealistic worry that is difficult to control occurring more days than not for at least 6 months about a number of events or activities. Hypervigilance (e.g., feeling constantly on edge, experiencing concentration difficulties, having trouble falling or staying asleep, exhibiting a general state of irritability). Low self-esteem. Problems Addressed  Unipolar Depression, Anxiety Goals 1. Alleviate depressive symptoms and return to previous level of effective functioning. 2. Appropriately grieve the loss in order to normalize mood and to return to previously adaptive level of functioning. Objective Learn and implement behavioral strategies to overcome depression. Target Date: 2023-02-07 Frequency: Biweekly  Progress: 10 Modality: individual  Related Interventions Assist the client in developing skills that increase the likelihood of deriving pleasure from behavioral activation (e.g., assertiveness skills, developing an exercise plan, less internal/more external focus, increased social involvement); reinforce success. Engage the client in "behavioral activation," increasing his/her activity level and contact with sources of reward, while identifying processes that inhibit activation. use behavioral techniques such as instruction, rehearsal, role-playing, role reversal, as needed, to facilitate activity in the client's daily life; reinforce success. 3. Develop healthy interpersonal relationships that lead to the alleviation and help prevent the relapse  of depression. 4. Develop healthy thinking patterns and beliefs about self, others, and the  world that lead to the alleviation and help prevent the relapse of depression. 5. Enhance ability to effectively cope with the full variety of life's worries and anxieties. 6. Learn and implement coping skills that result in a reduction of anxiety and worry, and improved daily functioning. Objective Learn and implement problem-solving strategies for realistically addressing worries. Target Date: 2023-02-07 Frequency: Biweekly  Progress: 10 Modality: individual  Related Interventions Assign the client a homework exercise in which he/she problem-solves a current problem (see Mastery of Your Anxiety and Worry: Workbook by Adora Fridge and Eliot Ford or Generalized Anxiety Disorder by Eather Colas, and Eliot Ford); review, reinforce success, and provide corrective feedback toward improvement. Teach the client problem-solving strategies involving specifically defining a problem, generating options for addressing it, evaluating the pros and cons of each option, selecting and implementing an optional action, and reevaluating and refining the action. Objective Learn and implement calming skills to reduce overall anxiety and manage anxiety symptoms. Target Date: 2023-02-07 Frequency: Biweekly  Progress: 10 Modality: individual  Related Interventions Assign the client to read about progressive muscle relaxation and other calming strategies in relevant books or treatment manuals (e.g., Progressive Relaxation Training by Leroy Kennedy; Mastery of Your Anxiety and Worry: Workbook by Beckie Busing). Assign the client homework each session in which he/she practices relaxation exercises daily, gradually applying them progressively from non-anxiety-provoking to anxiety-provoking situations; review and reinforce success while providing corrective feedback toward improvement. Teach the client calming/relaxation skills (e.g., applied relaxation, progressive muscle relaxation, cue controlled relaxation; mindful  breathing; biofeedback) and how to discriminate better between relaxation and tension; teach the client how to apply these skills to his/her daily life. 7. Recognize, accept, and cope with feelings of depression. 8. Reduce overall frequency, intensity, and duration of the anxiety so that daily functioning is not impaired. 9. Resolve the core conflict that is the source of anxiety. 10. Stabilize anxiety level while increasing ability to function on a daily basis. Diagnosis F43.23 Conditions For Discharge Achievement of treatment goals and objectives   Clint Bolder, LCSW

## 2022-09-22 ENCOUNTER — Other Ambulatory Visit: Payer: Self-pay

## 2022-09-22 ENCOUNTER — Encounter: Payer: Self-pay | Admitting: Emergency Medicine

## 2022-09-22 ENCOUNTER — Ambulatory Visit
Admission: EM | Admit: 2022-09-22 | Discharge: 2022-09-22 | Disposition: A | Discharge status: Home / Self Care | Payer: 59 | Attending: Family Medicine | Admitting: Family Medicine

## 2022-09-22 DIAGNOSIS — R112 Nausea with vomiting, unspecified: Secondary | ICD-10-CM

## 2022-09-22 MED ORDER — PROMETHAZINE HCL 25 MG RE SUPP: 25.0000 mg | Freq: Four times a day (QID) | RECTAL | 0 refills | Status: DC | PRN

## 2022-09-22 MED ORDER — PROMETHAZINE HCL 25 MG/ML IJ SOLN
25.0000 mg | Freq: Once | INTRAMUSCULAR | Status: AC
  Administered 2022-09-22: 25 mg via INTRAMUSCULAR

## 2022-09-22 NOTE — ED Triage Notes (Signed)
Pt recently increased her dose of Zepbound yesterday Intractable vomiting since 0400 Zofran this am at 0530, vomited it back  up Pt states phenergan works for her  Pt here w/ mom Pt aware phenergan can only be given IM & not IV  Provider to room during triage to evaluate Pt does not want to go to the ED at this time

## 2022-09-22 NOTE — ED Provider Notes (Signed)
[facility]    CSN: [id number] Arrival date & time: 09/22/22  9833      History   Chief Complaint Chief Complaint  Patient presents with   Emesis    HPI Tamar Dantuono is a 27 y.o. female.   HPI 27 year old female presents with nausea and vomiting since 4 AM this morning and is accompanied by her mother.  Reports taking Zofran at 530 this morning and vomited back up.  Reports that Zepbound was increased recently and had similar intractable vomiting and nausea when [facility] was increased.  Reports that Phenergan works well for her.  Patient reports does not want to go to the emergency room at this time.  PMH significant for obesity, depression, and recurrent UTI  Past Medical History:  Diagnosis Date   Anxiety    COVID-19    Depression    Depression, major, recurrent, moderate 05/25/2012   History of pyelonephritis 07/23/2018   Mucocele of lip 01/2020   Obesity (BMI 30-39.9) 07/23/2018   [facility]   Pityriasis versicolor 03/19/2021   Recurrent UTI 07/23/2018   Thyroid disorder     Patient Active Problem List   Diagnosis Date Noted   IUD (intrauterine device) in place 03/12/2022   Dysmenorrhea 03/12/2022   Abnormal urine cytology 02/12/2022   GAD (generalized anxiety disorder) 03/19/2021   History of pyelonephritis 07/23/2018   Nicotine dependence, cigarettes, uncomplicated 07/23/2018   Obesity (BMI 30-39.9) 07/23/2018    Past Surgical History:  Procedure Laterality Date   ORAL MUCOCELE EXCISION      OB History     Gravida  0   Para  0   Term  0   Preterm  0   AB  0   Living  0      SAB  0   IAB  0   Ectopic  0   Multiple  0   Live Births  0            Home Medications    Prior to Admission medications   Medication Sig Start Date End Date Taking? Authorizing Provider  promethazine (PHENERGAN) 25 MG suppository Place 1 suppository (25 mg total) rectally every 6 (six) hours as needed for nausea or vomiting.  09/22/22  Yes Trevor Iha, FNP  buPROPion (WELLBUTRIN SR) 150 MG 12 hr tablet Take 1 tablet (150 mg total) by mouth 2 (two) times daily. Take one tablet (150 mg) by mouth daily for the first 3-5 days, then increase to one tablet twice daily and continue for 7-12 weeks. Take evening dose no later than 6pm. Stop smoking after the first 5-7 days of treatment. 07/23/22   Clayborne Dana, NP  citalopram (CELEXA) 40 MG tablet Take 1 tablet (40 mg total) by mouth daily. 08/06/22   Clayborne Dana, NP  hydrocortisone (ANUSOL-HC) 25 MG suppository Place 1 suppository (25 mg total) rectally 2 (two) times daily as needed for hemorrhoids or anal itching. 08/09/22   Clayborne Dana, NP  hydrocortisone (PROCTO-MED HC) 2.5 % rectal cream Apply twice a day as needed 07/19/22   Sharlene Dory, DO  levonorgestrel (MIRENA) 20 MCG/DAY IUD 1 each by Intrauterine route once.    [provider]  Nitroglycerin 0.4 % OINT Apply 2 times daily as needed. Patient not taking: Reported on 08/30/2022 07/19/22   Sharlene Dory, DO  pantoprazole (PROTONIX) 40 MG tablet Take 1 tablet (40 mg total) by mouth daily. 09/16/22   Clayborne Dana, NP  tirzepatide (ZEPBOUND) 5 MG/0.5ML Pen Inject 5 mg into the skin once a week. 08/15/22   Clayborne Dana, NP    Family History Family History  Problem Relation Age of Onset   COPD Mother    Hypertension Mother    Heart attack Father    High Cholesterol Father    Depression Sister    Diabetes Maternal Grandfather    Hearing loss Maternal Grandfather    Heart attack Maternal Grandfather    Heart disease Maternal Grandfather    Hypertension Maternal Grandfather    Hyperlipidemia Maternal Grandfather    ADD / ADHD Paternal Grandfather    Alcohol abuse Paternal Grandfather    COPD Paternal Grandfather    Diabetes Paternal Grandfather    Hearing loss Paternal Grandfather    Hyperlipidemia Paternal Grandfather    Hypertension Paternal Grandfather    Stroke Paternal  Grandfather    Asthma Sister    Depression Sister    Drug abuse Sister    Breast cancer Maternal Grandmother    Pancreatic cancer Paternal Grandmother    Arthritis Paternal Grandmother    Hypertension Paternal Grandmother     Social History Social History   Tobacco Use   Smoking status: Some Days    Packs/day: 0    Types: Cigarettes   Smokeless tobacco: Never  Vaping Use   Vaping Use: Every day  Substance Use Topics   Alcohol use: Yes    Comment: social   Drug use: No     Allergies   Latex and Nickel   Review of Systems Review of Systems  Gastrointestinal:  Positive for nausea and vomiting.     Physical Exam Triage Vital Signs ED Triage Vitals  Enc Vitals Group     BP      Pulse      Resp      Temp      Temp src      SpO2      Weight      Height      Head Circumference      Peak Flow      Pain Score      Pain Loc      Pain Edu?      Excl. in GC?    No data found.  Updated Vital Signs BP 112/71 (BP Location: Right Arm)   Pulse 84   Resp 18   Ht 5\' 3"  (1.6 m)   Wt 162 lb 0.6 oz (73.5 kg)   LMP 08/29/2022 (Exact Date)   SpO2 100%   BMI 28.70 kg/m      Physical Exam Vitals and nursing note reviewed.  Constitutional:      General: She is not in acute distress.    Appearance: Normal appearance. She is obese. She is ill-appearing. She is not toxic-appearing or diaphoretic.  HENT:     Head: Normocephalic and atraumatic.     Right Ear: Tympanic membrane, ear canal and external ear normal.     Left Ear: Tympanic membrane normal.     Mouth/Throat:     Mouth: Mucous membranes are moist.     Pharynx: Oropharynx is clear.  Eyes:     Extraocular Movements: Extraocular movements intact.     Conjunctiva/sclera: Conjunctivae normal.     Pupils: Pupils are equal, round, and reactive to light.  Cardiovascular:     Rate and Rhythm: Normal rate and regular rhythm.     Pulses: Normal pulses.     Heart sounds:  Normal heart sounds. No murmur  heard. Pulmonary:     Effort: Pulmonary effort is normal.     Breath sounds: Normal breath sounds. No wheezing, rhonchi or rales.  Musculoskeletal:        General: Normal range of motion.     Cervical back: Normal range of motion and neck supple.  Skin:    General: Skin is warm and dry.  Neurological:     General: No focal deficit present.     Mental Status: She is alert and oriented to person, place, and time. Mental status is at baseline.  Psychiatric:        Mood and Affect: Mood normal.        Behavior: Behavior normal.        Thought Content: Thought content normal.      UC Treatments / Results  Labs (all labs ordered are listed, but only abnormal results are displayed) Labs Reviewed - No data to display  EKG   Radiology No results found.  Procedures Procedures (including critical care time)  Medications Ordered in UC Medications  promethazine (PHENERGAN) injection 25 mg (25 mg Intramuscular Given 09/22/22 1032)    Initial Impression / Assessment and Plan / UC Course  I have reviewed the triage vital signs and the nursing notes.  Pertinent labs & imaging results that were available during my care of the patient were reviewed by me and considered in my medical decision making (see chart for details).     MDM: 1.  Nausea and vomiting, unspecified vomiting type-IM Phenergan 25 mg given once and clinic and prior to discharge.  Rx'd Phenergan suppositories 25 mg every 6 hours for nausea and vomiting. Advised may use Phenergan suppositories every 6 hours for nausea or vomiting.  Advised patient/Mother if symptoms worsen [facility].  Encouraged patient to push fluids 64 ounces plus daily for the next 3- days may include Gatorade-G2, ginger ale.  Work note provided for patient prior to discharge.  Patient discharged home, hemodynamically stable. Final Clinical Impressions(s) / UC Diagnoses   Final diagnoses:  Nausea  and vomiting, unspecified vomiting type     Discharge Instructions      Advised may use Phenergan suppositories every 6 hours for nausea or vomiting.  Advised patient/Mother if symptoms worsen [facility] for [facility].  Encouraged patient to push fluids 64 ounces plus daily for the next 3- days may include Gatorade-G2, ginger ale.     ED Prescriptions     Medication Sig Dispense Auth. Provider   promethazine (PHENERGAN) 25 MG suppository Place 1 suppository (25 mg total) rectally every 6 (six) hours as needed for nausea or vomiting. 24 each Trevor Ihaagan, Hopie Pellegrin, FNP      PDMP not reviewed this encounter.   Trevor IhaRagan, Daviel Allegretto, FNP 09/22/22 1118

## 2022-09-22 NOTE — Discharge Instructions (Addendum)
Advised may use Phenergan suppositories every 6 hours for nausea or vomiting.  Advised patient/Mother if symptoms worsen go to [facility] ED for further evaluation.  Encouraged patient to push fluids 64 ounces plus daily for the next 3- days may include Gatorade-G2, ginger ale.

## 2022-09-23 ENCOUNTER — Telehealth: Payer: Self-pay | Admitting: Emergency Medicine

## 2022-09-23 NOTE — Telephone Encounter (Signed)
All to see how Barbara Lutz was today. Pt states phenergan shot "kicked in when she arrived home, went to sleep & took a suppository at 5pm" N/V resolved. Nadine Counts RN for the follow up call

## 2022-09-24 ENCOUNTER — Other Ambulatory Visit (INDEPENDENT_AMBULATORY_CARE_PROVIDER_SITE_OTHER): Payer: 59

## 2022-09-24 DIAGNOSIS — E669 Obesity, unspecified: Secondary | ICD-10-CM | POA: Diagnosis not present

## 2022-09-24 LAB — COMPREHENSIVE METABOLIC PANEL
ALT: 9 U/L (ref 0–35)
AST: 15 U/L (ref 0–37)
Albumin: 4.5 g/dL (ref 3.5–5.2)
Alkaline Phosphatase: 42 U/L (ref 39–117)
BUN: 16 mg/dL (ref 6–23)
CO2: 28 mEq/L (ref 19–32)
Calcium: 9.4 mg/dL (ref 8.4–10.5)
Chloride: 103 mEq/L (ref 96–112)
Creatinine, Ser: 1.04 mg/dL (ref 0.40–1.20)
GFR: 74.1 mL/min (ref >60.00)
Glucose, Bld: 84 mg/dL (ref 70–99)
Potassium: 3.9 mEq/L (ref 3.5–5.1)
Sodium: 139 mEq/L (ref 135–145)
Total Bilirubin: 0.5 mg/dL (ref 0.2–1.2)
Total Protein: 6.7 g/dL (ref 6.0–8.3)

## 2022-10-15 ENCOUNTER — Ambulatory Visit: Payer: 59 | Admitting: Psychology

## 2022-11-26 ENCOUNTER — Ambulatory Visit (INDEPENDENT_AMBULATORY_CARE_PROVIDER_SITE_OTHER): Payer: 59 | Admitting: Psychology

## 2022-11-26 DIAGNOSIS — F4323 Adjustment disorder with mixed anxiety and depressed mood: Secondary | ICD-10-CM | POA: Diagnosis not present

## 2022-11-26 NOTE — Progress Notes (Signed)
[facility] Counselor/Therapist Progress Note  Patient ID: Barbara Lutz, MRN: [id number],    Date: 11/26/2022  Time Spent: 5:00pm - 5:55pm    55 minutes   Treatment Type: Individual Therapy  Reported Symptoms: anxiety  Mental Status Exam: Appearance:  Casual     Behavior: Appropriate  Motor: Normal  Speech/Language:  Normal Rate  Affect: Appropriate  Mood: normal  Thought process: normal  Thought content:   WNL  Sensory/Perceptual disturbances:   WNL  Orientation: oriented to person, place, time/date, and situation  Attention: Good  Concentration: Good  Memory: WNL  Fund of knowledge:  Good  Insight:   Good  Judgment:  Good  Impulse Control: Good   Risk Assessment: Danger to Self:  No Self-injurious Behavior: No Danger to Others: No Duty to Warn:no Physical Aggression / Violence:No  Access to Firearms a concern: No  Gang Involvement:No   Subjective:  Pt present for face-to-face individual therapy via video.  Pt consents to telehealth video session due to COVID 19 pandemic. Location of pt: home Location of therapist: home office.  Pt talked about her [name] program.   She states the program is going well.   In the beginning of the program pt felt overwhelmed.   She has been in the program 8 weeks now and has changed the way she was handling things regarding studying and she is doing better.  Pt states she is "giving it her all".   Pt likes most of her classmates.  She is very close to a couple of other women.  Pt really loves the program now that she has some experience in it and is less anxious.   Pt talked with her probation officer a couple of weeks ago and was told she may be put back on probation bc of the money she owes.  Pt talked to a lawyer and has to figure out how to handle the situation.  Addressed how stressful this is for pt.  Pt talked about her sister being in jail.  Pt's sister and boyfriend got arrested for stealing and drug possession. Pt's  sister also lost custody of her son Jean Rosenthal.  Jackson stays with pt and pt's parents a few days a week.  Jean Rosenthal is 27 years old.   Pt has been coping with stress by smoking marijuana more.   She wants to work on reducing and stopping her marijuana use.  Worked on healthy coping strategies.   Worked on self care strategies.  Provided supportive therapy.    Interventions: Cognitive Behavioral Therapy and Insight-Oriented  Diagnosis:  F43.23  Plan of Care: Recommend ongoing therapy.   Pt participated in setting treatment goals.   Pt wants to improve coping skills and cope without using weed or getting angry.   Pt wants to manage stress better.    Plan to meet every two weeks.    Treatment Plan (Treatment Plan Target Date:  02/07/2023) Client Abilities/Strengths  Pt is bright, engaging, and motivated for therapy.  Client Treatment Preferences  Individual therapy.  Client Statement of Needs  Improve copings skills and understand herself better. Improve self esteem.  Symptoms  Depressed or irritable mood. Excessive and/or unrealistic worry that is difficult to control occurring more days than not for at least 6 months about a number of events or activities. Hypervigilance (e.g., feeling constantly on edge, experiencing concentration difficulties, having trouble falling or staying asleep, exhibiting a general state of irritability). Low self-esteem. Problems Addressed  Unipolar Depression, Anxiety Goals 1.  Alleviate depressive symptoms and return to previous level of effective functioning. 2. Appropriately grieve the loss in order to normalize mood and to return to previously adaptive level of functioning. Objective Learn and implement behavioral strategies to overcome depression. Target Date: 2023-02-07 Frequency: Biweekly  Progress: 10 Modality: individual  Related Interventions Assist the client in developing skills that increase the likelihood of deriving pleasure from behavioral activation  (e.g., assertiveness skills, developing an exercise plan, less internal/more external focus, increased social involvement); reinforce success. Engage the client in "behavioral activation," increasing his/her activity level and contact with sources of reward, while identifying processes that inhibit activation. use behavioral techniques such as instruction, rehearsal, role-playing, role reversal, as needed, to facilitate activity in the client's daily life; reinforce success. 3. Develop healthy interpersonal relationships that lead to the alleviation and help prevent the relapse of depression. 4. Develop healthy thinking patterns and beliefs about self, others, and the world that lead to the alleviation and help prevent the relapse of depression. 5. Enhance ability to effectively cope with the full variety of life's worries and anxieties. 6. Learn and implement coping skills that result in a reduction of anxiety and worry, and improved daily functioning. Objective Learn and implement problem-solving strategies for realistically addressing worries. Target Date: 2023-02-07 Frequency: Biweekly  Progress: 10 Modality: individual  Related Interventions Assign the client a homework exercise in which he/she problem-solves a current problem (see Mastery of Your Anxiety and Worry: Workbook by Elenora Fender and Filbert Schilder or Generalized Anxiety Disorder by Elesa Hacker, and Filbert Schilder); review, reinforce success, and provide corrective feedback toward improvement. Teach the client problem-solving strategies involving specifically defining a problem, generating options for addressing it, evaluating the pros and cons of each option, selecting and implementing an optional action, and reevaluating and refining the action. Objective Learn and implement calming skills to reduce overall anxiety and manage anxiety symptoms. Target Date: 2023-02-07 Frequency: Biweekly  Progress: 10 Modality: individual  Related  Interventions Assign the client to read about progressive muscle relaxation and other calming strategies in relevant books or treatment manuals (e.g., Progressive Relaxation Training by Twana First; Mastery of Your Anxiety and Worry: Workbook by Earlie Counts). Assign the client homework each session in which he/she practices relaxation exercises daily, gradually applying them progressively from non-anxiety-provoking to anxiety-provoking situations; review and reinforce success while providing corrective feedback toward improvement. Teach the client calming/relaxation skills (e.g., applied relaxation, progressive muscle relaxation, cue controlled relaxation; mindful breathing; biofeedback) and how to discriminate better between relaxation and tension; teach the client how to apply these skills to his/her daily life. 7. Recognize, accept, and cope with feelings of depression. 8. Reduce overall frequency, intensity, and duration of the anxiety so that daily functioning is not impaired. 9. Resolve the core conflict that is the source of anxiety. 10. Stabilize anxiety level while increasing ability to function on a daily basis. Diagnosis F43.23 Conditions For Discharge Achievement of treatment goals and objectives   Salomon Fick, LCSW

## 2022-12-13 ENCOUNTER — Other Ambulatory Visit (HOSPITAL_BASED_OUTPATIENT_CLINIC_OR_DEPARTMENT_OTHER): Payer: Self-pay

## 2022-12-16 ENCOUNTER — Other Ambulatory Visit: Payer: Self-pay | Admitting: Family Medicine

## 2022-12-16 ENCOUNTER — Other Ambulatory Visit (HOSPITAL_BASED_OUTPATIENT_CLINIC_OR_DEPARTMENT_OTHER): Payer: Self-pay

## 2022-12-16 DIAGNOSIS — F411 Generalized anxiety disorder: Secondary | ICD-10-CM

## 2022-12-16 DIAGNOSIS — F32A Depression, unspecified: Secondary | ICD-10-CM

## 2022-12-16 MED ORDER — CITALOPRAM HYDROBROMIDE 40 MG PO TABS
40.0000 mg | ORAL_TABLET | Freq: Every day | ORAL | 0 refills | Status: DC
  Filled 2022-12-16: qty 90, 90d supply, fill #0

## 2022-12-17 ENCOUNTER — Other Ambulatory Visit: Payer: Self-pay

## 2022-12-26 ENCOUNTER — Ambulatory Visit: Payer: 59 | Admitting: Family Medicine

## 2022-12-27 ENCOUNTER — Ambulatory Visit (INDEPENDENT_AMBULATORY_CARE_PROVIDER_SITE_OTHER): Payer: 59 | Admitting: Family Medicine

## 2022-12-27 ENCOUNTER — Encounter: Payer: Self-pay | Admitting: Family Medicine

## 2022-12-27 VITALS — BP 122/76 | HR 84 | Temp 98.4°F | Ht 63.0 in | Wt 166.2 lb

## 2022-12-27 DIAGNOSIS — Z3201 Encounter for pregnancy test, result positive: Secondary | ICD-10-CM

## 2022-12-27 NOTE — Progress Notes (Signed)
Chief Complaint  Patient presents with   Follow-up    Lab work    Subjective: Patient is a 27 y.o. female here for + preg test.  Patient took a pregnancy test and was positive on everyone.  Her last menstrual period was 11/25/2022.  She contacted her OB/GYN who told her that she needs a pregnancy test through her PCP.  She did recently purchase a prenatal vitamin.  She has never had a pregnancy before.  She has stopped all of her prescription medications.  She is having some slight nausea but has not taken anything for it yet.  Patient denies any vaginal bleeding or pain.  Past Medical History:  Diagnosis Date   Anxiety    COVID-19    Depression    Depression, major, recurrent, moderate (HCC) 05/25/2012   History of pyelonephritis 07/23/2018   Mucocele of lip 01/2020   Obesity (BMI 30-39.9) 07/23/2018   [facility]   Pityriasis versicolor 03/19/2021   Recurrent UTI 07/23/2018   Thyroid disorder     Objective: BP 122/76 (BP Location: Left Arm, Patient Position: Sitting, Cuff Size: Normal)   Pulse 84   Temp 98.4 F (36.9 C) (Oral)   Ht 5\' 3"  (1.6 m)   Wt 166 lb 4 oz (75.4 kg)   SpO2 99%   BMI 29.45 kg/m  General: Awake, appears stated age Heart: RRR, no LE edema Lungs: CTAB, no rales, wheezes or rhonchi. No accessory muscle use Abdomen: Bowel sounds present, soft, nontender, nondistended, nongravid Psych: Age appropriate judgment and insight, normal affect and mood  Assessment and Plan: Positive pregnancy test - Plan: POCT urine pregnancy, hCG, quantitative, pregnancy, CANCELED: hCG, serum, qualitative, CANCELED: hCG, serum, qualitative  Patient just urinated so we will check a serum pregnancy test.  Prenatal vitamin recommended.  Vitamin B6 and Unisom for nausea as needed.  Appreciate her setting up with the OB/GYN team already. The patient voiced understanding and agreement to the plan.  Jilda Roche Penelope, DO 12/27/22  4:00 PM

## 2022-12-27 NOTE — Patient Instructions (Addendum)
Take a prenatal vitamin daily.   Consider Unisom and Vit B6 for morning sickness.   Give [place] 2-3 business days to get the results of your labs back.   Let us know if you need anything.

## 2022-12-28 LAB — HCG, QUANTITATIVE, PREGNANCY: HCG, Total, QN: 2321 m[IU]/mL

## 2022-12-31 ENCOUNTER — Telehealth: Payer: Self-pay | Admitting: Family Medicine

## 2022-12-31 DIAGNOSIS — Z3482 Encounter for supervision of other normal pregnancy, second trimester: Secondary | ICD-10-CM | POA: Diagnosis not present

## 2022-12-31 DIAGNOSIS — Z3483 Encounter for supervision of other normal pregnancy, third trimester: Secondary | ICD-10-CM | POA: Diagnosis not present

## 2022-12-31 NOTE — Telephone Encounter (Signed)
Certainly could be hormonal changes as every woman is different. Could be the situation is triggering this, could be both. Could set up with a counselor/therapist.

## 2022-12-31 NOTE — Telephone Encounter (Signed)
Pt states she has been feeling very emotional since the positive pregnancy test and is not sure if it is due to hormones or completely cutting out nicotine. She stated her and Dr.Wendling had discussed going on zoloft if she wanted to start anxiety medication again so she would like to discuss that. Please advise.

## 2022-12-31 NOTE — Telephone Encounter (Signed)
Called the patient informed of provider response. She is seeing her counselor next week and will discuss with her.

## 2022-12-31 NOTE — Telephone Encounter (Signed)
She would like to know if this is just hormones or just her body stopping everything so abruptly. She would rather not start on medication at this time. Any other ideas other than mediations/nicotine patch.?

## 2023-01-10 ENCOUNTER — Telehealth: Payer: Self-pay

## 2023-01-10 NOTE — Telephone Encounter (Signed)
Called patient to inform her that constipation in pregnancy is likely due to hormonal changes and the iron from her prenatal vitamins. Advised patient to take Colace, Metamucil, Fibercon, or Miralax. Patient also advised to include leafy green veggies in her diet and to increase water intake. Understanding was voiced. Jada Fass l Bellamia Ferch, [name]

## 2023-01-10 NOTE — Telephone Encounter (Signed)
-----   Message from Madlyn Frankel sent at 01/10/2023  8:35 AM EDT ----- Regarding: Constipation Patient left a voicemail stating that she has been constipated and hasn't had a bowel movement in a few days. Wants to know if this is normal with the beginning stages of pregnancy and what she can take for it.   Best contact number is [phone].

## 2023-01-14 ENCOUNTER — Telehealth: Payer: Self-pay

## 2023-01-14 NOTE — Telephone Encounter (Signed)
Called patient and advised her that it is normal to have some cramping early in pregnancy as your uterus grows. Advised patient to go to [facility] at [address] if she starts bleeding heavy like a period. Also advised patient to take tylenol if needed and to increase fluids.Understanding was voiced. Daila Elbert l Robyne Matar, [name]

## 2023-01-14 NOTE — Telephone Encounter (Signed)
-----   Message from Madlyn Frankel sent at 01/13/2023  8:32 AM EDT ----- Regarding: Follow Up This patient called last week regarding her constipation issues. She wants to follow up with a nurse because she is still experiencing constipation and had some painful cramps yesterday. She did not know if it could've been gas or not.   Best contact number is [phone].

## 2023-01-28 ENCOUNTER — Ambulatory Visit (INDEPENDENT_AMBULATORY_CARE_PROVIDER_SITE_OTHER): Payer: 59 | Admitting: Psychology

## 2023-01-28 DIAGNOSIS — F4323 Adjustment disorder with mixed anxiety and depressed mood: Secondary | ICD-10-CM | POA: Diagnosis not present

## 2023-01-28 NOTE — Progress Notes (Signed)
[facility] Counselor/Therapist Progress Note  Patient ID: Barbara Lutz, MRN: [id number],    Date: 01/28/2023  Time Spent: 5:05pm - 6:00pm    55 minutes   Treatment Type: Individual Therapy  Reported Symptoms: anxiety  Mental Status Exam: Appearance:  Casual     Behavior: Appropriate  Motor: Normal  Speech/Language:  Normal Rate  Affect: Appropriate  Mood: normal  Thought process: normal  Thought content:   WNL  Sensory/Perceptual disturbances:   WNL  Orientation: oriented to person, place, time/date, and situation  Attention: Good  Concentration: Good  Memory: WNL  Fund of knowledge:  Good  Insight:   Good  Judgment:  Good  Impulse Control: Good   Risk Assessment: Danger to Self:  No Self-injurious Behavior: No Danger to Others: No Duty to Warn:no Physical Aggression / Violence:No  Access to Firearms a concern: No  Gang Involvement:No   Subjective:  Pt present for face-to-face individual therapy via video.  Pt consents to telehealth video session and is aware of limitations of virtual sessions. Location of pt: home Location of therapist: home office.  Pt talked about  being [redacted] weeks pregnant.  She has a "rocky" relationship with the baby's father Barbara Lutz who pt has had an on and off again relationship with for 6 years.   Barbara Lutz recently had his 2nd child with another baby mama.   Pt will be the 3rd woman he is having a baby with.   Pt is upset that Barbara Lutz has not told the other baby mamas about her pregnancy yet.  Pt feels alone and wants to feel supported by Barbara Lutz.   Addressed the dynamics of the love triangle pt is in and helped pt process her feelings.    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 Worked on healthy coping strategies.   Worked on self care strategies.  Provided supportive therapy.    Interventions: Cognitive Behavioral Therapy and Insight-Oriented  Diagnosis:  F43.23  Plan of Care: Recommend  ongoing therapy.   Pt participated in setting treatment goals.   Pt wants to improve coping skills and cope without using weed or getting angry.   Pt wants to manage stress better.    Plan to meet every two weeks.    Treatment Plan (Treatment Plan Target Date:  02/07/2023) Client Abilities/Strengths  Pt is bright, engaging, and motivated for therapy.  Client Treatment Preferences  Individual therapy.  Client Statement of Needs  Improve copings skills and understand herself better. Improve self esteem.  Symptoms  Depressed or irritable mood. Excessive and/or unrealistic worry that is difficult to control occurring more days than not for at least 6 months about a number of events or activities. Hypervigilance (e.g., feeling constantly on edge, experiencing concentration difficulties, having trouble falling or staying asleep, exhibiting a general state of irritability). Low self-esteem. Problems Addressed  Unipolar Depression, Anxiety Goals 1. Alleviate depressive symptoms and return to previous level of effective functioning. 2. Appropriately grieve the loss in order to normalize mood and to return to previously adaptive level of functioning. Objective Learn and implement behavioral strategies to overcome depression. Target Date: 2023-02-07 Frequency: Biweekly  Progress: 10 Modality: individual  Related Interventions Assist the client in developing skills that increase the likelihood of deriving pleasure from behavioral activation (e.g., assertiveness skills, developing an exercise plan, less internal/more external focus, increased social involvement); reinforce success. Engage the client in "behavioral activation," increasing his/her activity level and contact with  sources of reward, while identifying processes that inhibit activation. use behavioral techniques such as instruction, rehearsal, role-playing, role reversal, as needed, to facilitate activity in the client's daily life; reinforce  success. 3. Develop healthy interpersonal relationships that lead to the alleviation and help prevent the relapse of depression. 4. Develop healthy thinking patterns and beliefs about self, others, and the world that lead to the alleviation and help prevent the relapse of depression. 5. Enhance ability to effectively cope with the full variety of life's worries and anxieties. 6. Learn and implement coping skills that result in a reduction of anxiety and worry, and improved daily functioning. Objective Learn and implement problem-solving strategies for realistically addressing worries. Target Date: 2023-02-07 Frequency: Biweekly  Progress: 10 Modality: individual  Related Interventions Assign the client a homework exercise in which he/she problem-solves a current problem (see Mastery of Your Anxiety and Worry: Workbook by Elenora Fender and Filbert Schilder or Generalized Anxiety Disorder by Elesa Hacker, and Filbert Schilder); review, reinforce success, and provide corrective feedback toward improvement. Teach the client problem-solving strategies involving specifically defining a problem, generating options for addressing it, evaluating the pros and cons of each option, selecting and implementing an optional action, and reevaluating and refining the action. Objective Learn and implement calming skills to reduce overall anxiety and manage anxiety symptoms. Target Date: 2023-02-07 Frequency: Biweekly  Progress: 10 Modality: individual  Related Interventions Assign the client to read about progressive muscle relaxation and other calming strategies in relevant books or treatment manuals (e.g., Progressive Relaxation Training by Twana First; Mastery of Your Anxiety and Worry: Workbook by Earlie Counts). Assign the client homework each session in which he/she practices relaxation exercises daily, gradually applying them progressively from non-anxiety-provoking to anxiety-provoking situations; review and reinforce  success while providing corrective feedback toward improvement. Teach the client calming/relaxation skills (e.g., applied relaxation, progressive muscle relaxation, cue controlled relaxation; mindful breathing; biofeedback) and how to discriminate better between relaxation and tension; teach the client how to apply these skills to his/her daily life. 7. Recognize, accept, and cope with feelings of depression. 8. Reduce overall frequency, intensity, and duration of the anxiety so that daily functioning is not impaired. 9. Resolve the core conflict that is the source of anxiety. 10. Stabilize anxiety level while increasing ability to function on a daily basis. Diagnosis F43.23 Conditions For Discharge Achievement of treatment goals and objectives   Salomon Fick, LCSW

## 2023-01-29 ENCOUNTER — Other Ambulatory Visit (HOSPITAL_BASED_OUTPATIENT_CLINIC_OR_DEPARTMENT_OTHER): Payer: Self-pay

## 2023-01-29 ENCOUNTER — Encounter: Payer: Self-pay | Admitting: Family Medicine

## 2023-01-29 ENCOUNTER — Ambulatory Visit (INDEPENDENT_AMBULATORY_CARE_PROVIDER_SITE_OTHER): Payer: 59 | Admitting: Family Medicine

## 2023-01-29 ENCOUNTER — Other Ambulatory Visit (INDEPENDENT_AMBULATORY_CARE_PROVIDER_SITE_OTHER): Payer: 59

## 2023-01-29 ENCOUNTER — Other Ambulatory Visit (HOSPITAL_COMMUNITY)
Admission: RE | Admit: 2023-01-29 | Discharge: 2023-01-29 | Disposition: A | Discharge status: Home / Self Care | Payer: 59 | Source: Ambulatory Visit | Attending: Family Medicine | Admitting: Family Medicine

## 2023-01-29 VITALS — BP 111/66 | HR 89 | Wt 160.0 lb

## 2023-01-29 DIAGNOSIS — Z3A09 9 weeks gestation of pregnancy: Secondary | ICD-10-CM

## 2023-01-29 DIAGNOSIS — Z3400 Encounter for supervision of normal first pregnancy, unspecified trimester: Secondary | ICD-10-CM | POA: Insufficient documentation

## 2023-01-29 DIAGNOSIS — Z3401 Encounter for supervision of normal first pregnancy, first trimester: Secondary | ICD-10-CM | POA: Diagnosis not present

## 2023-01-29 DIAGNOSIS — Z1339 Encounter for screening examination for other mental health and behavioral disorders: Secondary | ICD-10-CM | POA: Diagnosis not present

## 2023-01-29 DIAGNOSIS — O0993 Supervision of high risk pregnancy, unspecified, third trimester: Secondary | ICD-10-CM | POA: Insufficient documentation

## 2023-01-29 MED ORDER — ONDANSETRON 4 MG PO TBDP
4.0000 mg | ORAL_TABLET | Freq: Four times a day (QID) | ORAL | 3 refills | Status: DC | PRN
  Filled 2023-01-29: qty 30, 8d supply, fill #0
  Filled 2023-02-22: qty 30, 8d supply, fill #1
  Filled 2023-05-24: qty 30, 8d supply, fill #2

## 2023-01-29 NOTE — Progress Notes (Signed)
Subjective:  Barbara Lutz is a G1P0000 [redacted]w[redacted]d consistent with [place] today being seen today for her first obstetrical visit.  Her obstetrical history is significant for  first pregnancy . Patient does intend to breast feed. Pregnancy history fully reviewed.  Patient reports nausea.  BP 111/66   Pulse 89   Wt 160 lb (72.6 kg)   LMP 11/24/2022   BMI 28.34 kg/m   HISTORY: OB History  Gravida Para Term Preterm AB Living  1 0 0 0 0 0  SAB IAB Ectopic Multiple Live Births  0 0 0 0 0    # Outcome Date GA Lbr Len/2nd Weight Sex Type Anes PTL Lv  1 Current             Past Medical History:  Diagnosis Date   Anxiety    COVID-19    Depression    Depression, major, recurrent, moderate (HCC) 05/25/2012   History of pyelonephritis 07/23/2018   Mucocele of lip 01/2020   Obesity (BMI 30-39.9) 07/23/2018   [facility]   Pityriasis versicolor 03/19/2021   Recurrent UTI 07/23/2018   Thyroid disorder     Past Surgical History:  Procedure Laterality Date   ORAL MUCOCELE EXCISION      Family History  Problem Relation Age of Onset   COPD Mother    Hypertension Mother    Heart attack Father    High Cholesterol Father    Depression Sister    Diabetes Maternal Grandfather    Hearing loss Maternal Grandfather    Heart attack Maternal Grandfather    Heart disease Maternal Grandfather    Hypertension Maternal Grandfather    Hyperlipidemia Maternal Grandfather    ADD / ADHD Paternal Grandfather    Alcohol abuse Paternal Grandfather    COPD Paternal Grandfather    Diabetes Paternal Grandfather    Hearing loss Paternal Grandfather    Hyperlipidemia Paternal Grandfather    Hypertension Paternal Grandfather    Stroke Paternal Grandfather    Asthma Sister    Depression Sister    Drug abuse Sister    Breast cancer Maternal Grandmother    Pancreatic cancer Paternal Grandmother    Arthritis Paternal Grandmother    Hypertension Paternal Grandmother      Exam  BP 111/66    Pulse 89   Wt 160 lb (72.6 kg)   LMP 11/24/2022   BMI 28.34 kg/m   Chaperone present during exam  CONSTITUTIONAL: Well-developed, well-nourished female in no acute distress.  HENT:  Normocephalic, atraumatic, External right and left ear normal. Oropharynx is clear and moist EYES: Conjunctivae and EOM are normal. Pupils are equal, round, and reactive to light. No scleral icterus.  NECK: Normal range of motion, supple, no masses.  Normal thyroid.  CARDIOVASCULAR: Normal heart rate noted, regular rhythm RESPIRATORY: Clear to auscultation bilaterally. Effort and breath sounds normal, no problems with respiration noted. BREASTS: Symmetric in size. No masses, skin changes, nipple drainage, or lymphadenopathy. ABDOMEN: Soft, normal bowel sounds, no distention noted.  No tenderness, rebound or guarding.  PELVIC: Normal appearing external genitalia; normal appearing vaginal mucosa and cervix. No abnormal discharge noted. Normal uterine size, no other palpable masses, no uterine or adnexal tenderness. MUSCULOSKELETAL: Normal range of motion. No tenderness.  No cyanosis, clubbing, or edema.  2+ distal pulses. SKIN: Skin is warm and dry. No rash noted. Not diaphoretic. No erythema. No pallor. NEUROLOGIC: Alert and oriented to person, place, and time. Normal reflexes, muscle tone coordination. No cranial nerve deficit noted. PSYCHIATRIC:  Normal mood and affect. Normal behavior. Normal judgment and thought content.    Assessment:    Pregnancy: G1P0000 Patient Active Problem List   Diagnosis Date Noted   Supervision of normal first pregnancy, antepartum 01/29/2023   IUD (intrauterine device) in place 03/12/2022   Dysmenorrhea 03/12/2022   Abnormal urine cytology 02/12/2022   GAD (generalized anxiety disorder) 03/19/2021   History of pyelonephritis 07/23/2018   Nicotine dependence, cigarettes, uncomplicated 07/23/2018   Obesity (BMI 30-39.9) 07/23/2018      Plan:   1. Supervision of normal  first pregnancy, antepartum FHT normal Zofran for nausea - CBC/D/Plt+RPR+Rh+ABO+RubIgG... - Cytology - PAP( [facility]) - US OB Limited; Future - Urine Culture - [place] MFM OB COMP + 14 WK; Future - [facility] AMB BABYSCRIPTS OPT IN    Initial labs obtained Continue prenatal vitamins Reviewed n/v relief measures and warning s/s to report Reviewed recommended weight gain based on pre-gravid BMI Encouraged well-balanced diet Genetic & carrier screening discussed: requests Panorama and Horizon  (will get next visit),  Ultrasound discussed; fetal survey: requested CCNC completed> form faxed if has or is planning to apply for medicaid The nature of [facility] [employer] with multiple MDs and other Advanced Practice Providers was explained to patient; also emphasized that fellows, residents, and students are part of our team.  No indications for ASA or early GDM screening  Problem list reviewed and updated. 75% of 30 min visit spent on counseling and coordination of care.     Levie Heritage 01/29/2023

## 2023-01-30 LAB — CBC/D/PLT+RPR+RH+ABO+RUBIGG...
Antibody Screen: NEGATIVE
Basophils Absolute: 0 10*3/uL (ref 0.0–0.2)
Basos: 0 %
EOS (ABSOLUTE): 0 10*3/uL (ref 0.0–0.4)
Eos: 0 %
HCV Ab: NONREACTIVE
HIV Screen 4th Generation wRfx: NONREACTIVE
Hematocrit: 38.4 % (ref 34.0–46.6)
Hemoglobin: 12.7 g/dL (ref 11.1–15.9)
Hepatitis B Surface Ag: NEGATIVE
Immature Grans (Abs): 0 10*3/uL (ref 0.0–0.1)
Immature Granulocytes: 0 %
Lymphocytes Absolute: 2 10*3/uL (ref 0.7–3.1)
Lymphs: 22 %
MCH: 31.2 pg (ref 26.6–33.0)
MCHC: 33.1 g/dL (ref 31.5–35.7)
MCV: 94 fL (ref 79–97)
Monocytes Absolute: 0.7 10*3/uL (ref 0.1–0.9)
Monocytes: 7 %
Neutrophils Absolute: 6.5 10*3/uL (ref 1.4–7.0)
Neutrophils: 71 %
Platelets: 207 10*3/uL (ref 150–450)
RBC: 4.07 x10E6/uL (ref 3.77–5.28)
RDW: 12.5 % (ref 11.7–15.4)
RPR Ser Ql: NONREACTIVE
Rh Factor: POSITIVE
Rubella Antibodies, IGG: 2.67 {index} (ref >0.99)
WBC: 9.3 10*3/uL (ref 3.4–10.8)

## 2023-01-30 LAB — HCV INTERPRETATION

## 2023-01-31 LAB — CYTOLOGY - PAP
Chlamydia: NEGATIVE
Comment: NEGATIVE
Comment: NORMAL
Diagnosis: NEGATIVE
Neisseria Gonorrhea: NEGATIVE

## 2023-01-31 LAB — URINE CULTURE: Organism ID, Bacteria: NO GROWTH

## 2023-02-04 ENCOUNTER — Encounter: Payer: 59 | Admitting: Obstetrics and Gynecology

## 2023-02-11 ENCOUNTER — Ambulatory Visit: Payer: 59

## 2023-02-11 DIAGNOSIS — Z3400 Encounter for supervision of normal first pregnancy, unspecified trimester: Secondary | ICD-10-CM

## 2023-02-11 DIAGNOSIS — Z1379 Encounter for other screening for genetic and chromosomal anomalies: Secondary | ICD-10-CM

## 2023-02-11 DIAGNOSIS — Z3A11 11 weeks gestation of pregnancy: Secondary | ICD-10-CM

## 2023-02-11 NOTE — Progress Notes (Signed)
Patient presents for Panorama. Panorama was drawn here in the office. Aiva Miskell l Dodd Schmid, [name]

## 2023-02-17 LAB — PANORAMA PRENATAL TEST FULL PANEL:PANORAMA TEST PLUS 5 ADDITIONAL MICRODELETIONS: FETAL FRACTION: 8.2

## 2023-02-18 LAB — HORIZON CUSTOM: REPORT SUMMARY: NEGATIVE

## 2023-02-20 ENCOUNTER — Ambulatory Visit (INDEPENDENT_AMBULATORY_CARE_PROVIDER_SITE_OTHER): Payer: 59 | Admitting: Psychology

## 2023-02-20 DIAGNOSIS — F4323 Adjustment disorder with mixed anxiety and depressed mood: Secondary | ICD-10-CM

## 2023-02-20 NOTE — Progress Notes (Signed)
[facility] Counselor Initial Adult Exam  Name: Barbara Lutz Date: 02/20/2023 MRN: [id number] DOB: 06/06/1996 PCP: Clayborne Dana, NP  Time spent: 5:00pm-5:50pm   50 minutes  Guardian/Payee:  Donnamarie Poag requested: No   Reason for Visit /Presenting Problem: Pt present for face-to-face initial assessment update via video.  Pt consents to telehealth video session and is aware of limitations and benefits of virtual sessions. Location of pt: home Location of therapist: home office.  Pt is about [redacted] weeks pregnant.  She has a "rocky" relationship with the baby's father Vonna Kotyk who pt has had an on and off again relationship with for 6 years.   Vonna Kotyk recently had his 2nd child with another baby mama.   Pt will be the 3rd woman he is having a baby with.  Addressed the dynamics of the love triangle pt is in and helped pt process her feelings.   Pt just found out she is having a baby boy.  She really wanted a girl but mostly just wants the baby to be healthy.   Pt is thinking about and preparing for the challenges of single parenting.  Pt is close to her family and will have a lot of support from them. 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    Reviewed pt's treatment plan for annual update.   Pt participated in setting treatment goals.   Plan to meet monthly.   Pt agrees with treatment plan.   Mental Status Exam: Appearance:   Casual     Behavior:  Appropriate  Motor:  Normal  Speech/Language:   Normal Rate  Affect:  Appropriate and Tearful  Mood:  normal  Thought process:  normal  Thought content:    WNL  Sensory/Perceptual disturbances:    WNL  Orientation:  oriented to person, place, time/date, and situation  Attention:  Good  Concentration:  Good  Memory:  WNL  Fund of knowledge:   Good  Insight:    Good  Judgment:   Fair  Impulse Control:  Fair    Reported Symptoms:  anxiety,  depression  Risk Assessment: Danger to Self:  No Self-injurious Behavior: No Danger to Others: No Duty to Warn:no Physical Aggression / Violence:No  Access to Firearms a concern: No  Gang Involvement:No  Patient / guardian was educated about steps to take if suicide or homicide risk level increases between visits: n/a While future psychiatric events cannot be accurately predicted, the patient does not currently require acute inpatient psychiatric care and does not currently meet [facility] Psychiatric involuntary commitment criteria.  Substance Abuse History: Current substance abuse: Yes     Past Psychiatric History:   Previous psychological history is significant for anxiety Outpatient Providers:pt was in therapy in middle school. History of Psych Hospitalization: No  Psychological Testing:  n/a    Abuse History:  Victim of: No.,  n/a    Report needed: No. Victim of Neglect:No. Perpetrator of  n/a   Witness / Exposure to Domestic Violence: No   Protective Services Involvement: No  Witness to [employer] Violence:  No   Family History:  Family History  Problem Relation Age of Onset   COPD Mother    Hypertension Mother    Heart attack Father    High Cholesterol Father    Depression Sister    Diabetes Maternal Grandfather    Hearing loss Maternal Grandfather  Heart attack Maternal Grandfather    Heart disease Maternal Grandfather    Hypertension Maternal Grandfather    Hyperlipidemia Maternal Grandfather    ADD / ADHD Paternal Grandfather    Alcohol abuse Paternal Grandfather    COPD Paternal Grandfather    Diabetes Paternal Grandfather    Hearing loss Paternal Grandfather    Hyperlipidemia Paternal Grandfather    Hypertension Paternal Grandfather    Stroke Paternal Grandfather    Asthma Sister    Depression Sister    Drug abuse Sister    Breast cancer Maternal Grandmother    Pancreatic cancer Paternal Grandmother    Arthritis Paternal Grandmother    Hypertension  Paternal Grandmother     Living situation: the patient lives alone.   She lives next door to her parents and 26 yo nephew Olegario Shearer who pt's parents have custody of.   Pt's aunt and uncle and grandparents live close by as well.    Pt grew up with both parents and 2 sisters.  Pt is the youngest.    Family history of mental health issues:   mother has depression. Father has anxiety. No childhood abuse.  Pt states she had a very good childhood.   Sexual Orientation: Straight  Relationship Status: single  Name of spouse / other:n/a If a parent, number of children / ages:n/a  Support Systems: parents  Financial Stress:  Yes   Income/Employment/Disability: Employment  Financial planner: No   Educational History: Education: high school diploma/GED  Religion/Sprituality/World View: Protestant.  Pt is Christain / Baptist and wants to get back to church.    Any cultural differences that may affect / interfere with treatment:  not applicable   Recreation/Hobbies: pt feels like she can not do things she enjoys bc of financial stress.    Stressors: Legal issues and family stress.  Strengths: Family, Spirituality, Hopefulness, Self Advocate, and Able to Communicate Effectively  Barriers:  none   Legal History: Pending legal issue / charges: The patient has been involved with the police as a result of driving under the influence of alcohol. History of legal issue / charges: DUI  Medical History/Surgical History: reviewed Past Medical History:  Diagnosis Date   Anxiety    COVID-19    Depression    Depression, major, recurrent, moderate (HCC) 05/25/2012   History of pyelonephritis 07/23/2018   Mucocele of lip 01/2020   Obesity (BMI 30-39.9) 07/23/2018   [facility]   Pityriasis versicolor 03/19/2021   Recurrent UTI 07/23/2018   Thyroid disorder     Past Surgical History:  Procedure Laterality Date   ORAL MUCOCELE EXCISION      Medications: Current Outpatient  Medications  Medication Sig Dispense Refill   ondansetron (ZOFRAN-ODT) 4 MG disintegrating tablet Take 1 tablet (4 mg total) by mouth every 6 (six) hours as needed for nausea. 30 tablet 3   Prenatal Vit-Fe Fumarate-FA (MULTIVITAMIN-PRENATAL) 27-0.8 MG TABS tablet Take 1 tablet by mouth daily at 12 noon.     No current facility-administered medications for this visit.    Allergies  Allergen Reactions   Latex Rash   Nickel Dermatitis    Diagnoses:  F43.23  Plan of Care: Recommend ongoing therapy.   Pt participated in setting treatment goals.   Pt wants to improve coping skills and cope without using weed or getting angry.   Pt wants to manage stress better.    Plan to meet monthly.   Treatment Plan (Treatment Plan Target Date:  02/20/2024) Client Abilities/Strengths  Pt is bright, engaging, and motivated for therapy.  Client Treatment Preferences  Individual therapy.  Client Statement of Needs  Improve copings skills and understand herself better. Improve self esteem.  Symptoms  Depressed or irritable mood. Excessive and/or unrealistic worry that is difficult to control occurring more days than not for at least 6 months about a number of events or activities. Hypervigilance (e.g., feeling constantly on edge, experiencing concentration difficulties, having trouble falling or staying asleep, exhibiting a general state of irritability). Low self-esteem. Problems Addressed  Unipolar Depression, Anxiety Goals 1. Alleviate depressive symptoms and return to previous level of effective functioning. 2. Appropriately grieve the loss in order to normalize mood and to return to previously adaptive level of functioning. Objective Learn and implement behavioral strategies to overcome depression. Target Date: 2024-02-20 Frequency: Monthly  Progress: 30 Modality: individual  Related Interventions Assist the client in developing skills that increase the likelihood of deriving pleasure from  behavioral activation (e.g., assertiveness skills, developing an exercise plan, less internal/more external focus, increased social involvement); reinforce success. Engage the client in "behavioral activation," increasing his/her activity level and contact with sources of reward, while identifying processes that inhibit activation. use behavioral techniques such as instruction, rehearsal, role-playing, role reversal, as needed, to facilitate activity in the client's daily life; reinforce success. 3. Develop healthy interpersonal relationships that lead to the alleviation and help prevent the relapse of depression. 4. Develop healthy thinking patterns and beliefs about self, others, and the world that lead to the alleviation and help prevent the relapse of depression. 5. Enhance ability to effectively cope with the full variety of life's worries and anxieties. 6. Learn and implement coping skills that result in a reduction of anxiety and worry, and improved daily functioning. Objective Learn and implement problem-solving strategies for realistically addressing worries. Target Date: 2024-02-20 Frequency: Monthly  Progress: 30 Modality: individual  Related Interventions Assign the client a homework exercise in which he/she problem-solves a current problem (see Mastery of Your Anxiety and Worry: Workbook by Elenora Fender and Filbert Schilder or Generalized Anxiety Disorder by Elesa Hacker, and Filbert Schilder); review, reinforce success, and provide corrective feedback toward improvement. Teach the client problem-solving strategies involving specifically defining a problem, generating options for addressing it, evaluating the pros and cons of each option, selecting and implementing an optional action, and reevaluating and refining the action. Objective Learn and implement calming skills to reduce overall anxiety and manage anxiety symptoms. Target Date: 2024-02-20 Frequency: Monthly  Progress: 30 Modality: individual  Related  Interventions Assign the client to read about progressive muscle relaxation and other calming strategies in relevant books or treatment manuals (e.g., Progressive Relaxation Training by Twana First; Mastery of Your Anxiety and Worry: Workbook by Earlie Counts). Assign the client homework each session in which he/she practices relaxation exercises daily, gradually applying them progressively from non-anxiety-provoking to anxiety-provoking situations; review and reinforce success while providing corrective feedback toward improvement. Teach the client calming/relaxation skills (e.g., applied relaxation, progressive muscle relaxation, cue controlled relaxation; mindful breathing; biofeedback) and how to discriminate better between relaxation and tension; teach the client how to apply these skills to his/her daily life. 7. Recognize, accept, and cope with feelings of depression. 8. Reduce overall frequency, intensity, and duration of the anxiety so that daily functioning is not impaired. 9. Resolve the core conflict that is the source of anxiety. 10. Stabilize anxiety level while increasing ability to function on a daily basis. Diagnosis F43.23 Conditions For Discharge Achievement of treatment goals and objectives  Sidnie Swalley, LCSW

## 2023-02-25 ENCOUNTER — Ambulatory Visit (INDEPENDENT_AMBULATORY_CARE_PROVIDER_SITE_OTHER): Payer: 59 | Admitting: Family Medicine

## 2023-02-25 ENCOUNTER — Encounter: Payer: Self-pay | Admitting: Family Medicine

## 2023-02-25 ENCOUNTER — Other Ambulatory Visit (HOSPITAL_BASED_OUTPATIENT_CLINIC_OR_DEPARTMENT_OTHER): Payer: Self-pay

## 2023-02-25 VITALS — BP 120/70 | HR 77 | Temp 98.9°F | Resp 18 | Ht 63.0 in | Wt 169.0 lb

## 2023-02-25 DIAGNOSIS — N61 Mastitis without abscess: Secondary | ICD-10-CM | POA: Diagnosis not present

## 2023-02-25 MED ORDER — CEPHALEXIN 500 MG PO CAPS
500.0000 mg | ORAL_CAPSULE | Freq: Two times a day (BID) | ORAL | 0 refills | Status: DC
Start: 2023-02-25 — End: 2023-03-06
  Filled 2023-02-25: qty 20, 10d supply, fill #0

## 2023-02-25 NOTE — Progress Notes (Signed)
Established Patient Office Visit  Subjective   Patient ID: Barbara Lutz, female    DOB: 1996-04-30  Age: 27 y.o. MRN: [id number]  Chief Complaint  Patient presents with   Breast Pain    HPI Discussed the use of AI scribe software for clinical note transcription with the patient, who gave verbal consent to proceed.  History of Present Illness   The patient, with a history of nipple piercings, presents with nipple pain and discharge. She removed their piercings upon learning of their pregnancy to allow for healing and breastfeeding. She reports persistent piercing holes with occasional white discharge, which she has to actively check for. She describes the pain as a 'stinging, horrible pain,' likening it to 'a knife stabbing' in her nipples. She denies spontaneous leakage, fever, abnormal discharge, or bleeding. She is unsure if the symptoms are due to an infection or a normal response to pregnancy.      Patient Active Problem List   Diagnosis Date Noted   Supervision of normal first pregnancy, antepartum 01/29/2023   Dysmenorrhea 03/12/2022   Abnormal urine cytology 02/12/2022   GAD (generalized anxiety disorder) 03/19/2021   History of pyelonephritis 07/23/2018   Nicotine dependence, cigarettes, uncomplicated 07/23/2018   Obesity (BMI 30-39.9) 07/23/2018   Past Medical History:  Diagnosis Date   Anxiety    COVID-19    Depression    Depression, major, recurrent, moderate (HCC) 05/25/2012   History of pyelonephritis 07/23/2018   Mucocele of lip 01/2020   Obesity (BMI 30-39.9) 07/23/2018   [facility]   Pityriasis versicolor 03/19/2021   Recurrent UTI 07/23/2018   Thyroid disorder    Past Surgical History:  Procedure Laterality Date   ORAL MUCOCELE EXCISION     Social History   Tobacco Use   Smoking status: Some Days    Types: Cigarettes   Smokeless tobacco: Never  Vaping Use   Vaping status: Every Day  Substance Use Topics   Alcohol use: Yes     Comment: social   Drug use: No   Social History   Socioeconomic History   Marital status: Single    Spouse name: Not on file   Number of children: Not on file   Years of education: Not on file   Highest education level: GED or equivalent  Occupational History   Not on file  Tobacco Use   Smoking status: Some Days    Types: Cigarettes   Smokeless tobacco: Never  Vaping Use   Vaping status: Every Day  Substance and Sexual Activity   Alcohol use: Yes    Comment: social   Drug use: No   Sexual activity: Yes    Partners: Male    Birth control/protection: None  Other Topics Concern   Not on file  Social History Narrative   Marital status/children/pets: Single.   Education/employment: GED.  Employed. Manager.   Safety:      -smoke alarm in the home:Yes     - wears seatbelt: Yes     - Feels safe in their relationships: Yes   Social Determinants of Health   Financial Resource Strain: Low Risk  (12/26/2022)   Overall Financial Resource Strain (CARDIA)    Difficulty of Paying Living Expenses: Not very hard  Food Insecurity: No Food Insecurity (12/26/2022)   Hunger Vital Sign    Worried About Running Out of Food in the Last Year: Never true    Ran Out of Food in the Last Year: Never true  Transportation Needs: No Transportation Needs (12/26/2022)   PRAPARE - Administrator, Civil Service (Medical): No    Lack of Transportation (Non-Medical): No  Physical Activity: Insufficiently Active (12/26/2022)   Exercise Vital Sign    Days of Exercise per Week: 3 days    Minutes of Exercise per Session: 30 min  Stress: No Stress Concern Present (12/26/2022)   [employer] of Occupational Health - Occupational Stress Questionnaire    Feeling of Stress : Only a little  Social Connections: Moderately Isolated (12/26/2022)   Social Connection and Isolation Panel [NHANES]    Frequency of Communication with Friends and Family: More than three times a week    Frequency of Social  Gatherings with Friends and Family: More than three times a week    Attends Religious Services: More than 4 times per year    Active Member of [employer] or Organizations: No    Attends Engineer, structural: Not on file    Marital Status: Never married  Intimate Partner Violence: Unknown (09/18/2021)   Received from [employer], [facility]   HITS    Physically Hurt: Not on file    Insult or Talk Down To: Not on file    Threaten Physical Harm: Not on file    Scream or Curse: Not on file   Family Status  Relation Name Status   Mother  Alive   Father  Alive   Sister Nehemiah Settle Alive   MGF  Alive   PGF  Deceased   Sister Librarian, academic   MGM  Deceased   PGM  Deceased  No partnership data on file   Family History  Problem Relation Age of Onset   COPD Mother    Hypertension Mother    Heart attack Father    High Cholesterol Father    Depression Sister    Diabetes Maternal Grandfather    Hearing loss Maternal Grandfather    Heart attack Maternal Grandfather    Heart disease Maternal Grandfather    Hypertension Maternal Grandfather    Hyperlipidemia Maternal Grandfather    ADD / ADHD Paternal Grandfather    Alcohol abuse Paternal Grandfather    COPD Paternal Grandfather    Diabetes Paternal Grandfather    Hearing loss Paternal Grandfather    Hyperlipidemia Paternal Grandfather    Hypertension Paternal Grandfather    Stroke Paternal Grandfather    Asthma Sister    Depression Sister    Drug abuse Sister    Breast cancer Maternal Grandmother    Pancreatic cancer Paternal Grandmother    Arthritis Paternal Grandmother    Hypertension Paternal Grandmother    Allergies  Allergen Reactions   Latex Rash   Nickel Dermatitis      Review of Systems  Constitutional:  Negative for fever and malaise/fatigue.  HENT:  Negative for congestion.   Eyes:  Negative for blurred vision.  Respiratory:  Negative for shortness of breath.   Cardiovascular:  Negative for chest pain,  palpitations and leg swelling.  Gastrointestinal:  Negative for abdominal pain, blood in stool and nausea.  Genitourinary:  Negative for dysuria and frequency.  Musculoskeletal:  Negative for falls.  Skin:  Negative for rash.       Breast tenderness and nipple d/c   Neurological:  Negative for dizziness, loss of consciousness and headaches.  Endo/Heme/Allergies:  Negative for environmental allergies.  Psychiatric/Behavioral:  Negative for depression. The patient is not nervous/anxious.       Objective:  BP 120/70 (BP Location: Left Arm, Patient Position: Sitting, Cuff Size: Normal)   Pulse 77   Temp 98.9 F (37.2 C) (Oral)   Resp 18   Ht 5\' 3"  (1.6 m)   Wt 169 lb (76.7 kg)   LMP 11/24/2022   SpO2 99%   BMI 29.94 kg/m  BP Readings from Last 3 Encounters:  02/25/23 120/70  01/29/23 111/66  12/27/22 122/76   Wt Readings from Last 3 Encounters:  02/25/23 169 lb (76.7 kg)  01/29/23 160 lb (72.6 kg)  12/27/22 166 lb 4 oz (75.4 kg)   SpO2 Readings from Last 3 Encounters:  02/25/23 99%  12/27/22 99%  09/22/22 100%      Physical Exam Vitals and nursing note reviewed.  Constitutional:      General: She is not in acute distress.    Appearance: Normal appearance. She is well-developed.  HENT:     Head: Normocephalic and atraumatic.  Eyes:     General: No scleral icterus.       Right eye: No discharge.        Left eye: No discharge.  Cardiovascular:     Rate and Rhythm: Normal rate and regular rhythm.     Heart sounds: No murmur heard. Pulmonary:     Effort: Pulmonary effort is normal. No respiratory distress.     Breath sounds: Normal breath sounds.  Chest:  Breasts:    Right: Nipple discharge and tenderness present.     Left: Nipple discharge and tenderness present.     Comments: Breast warm to touch Tender to touch White d/c from nipple piercing  Musculoskeletal:        General: Normal range of motion.     Cervical back: Normal range of motion and neck  supple.     Right lower leg: No edema.     Left lower leg: No edema.  Skin:    General: Skin is warm and dry.  Neurological:     Mental Status: She is alert and oriented to person, place, and time.  Psychiatric:        Mood and Affect: Mood normal.        Behavior: Behavior normal.        Thought Content: Thought content normal.        Judgment: Judgment normal.     No results found for any visits on 02/25/23.  Last CBC Lab Results  Component Value Date   WBC 9.3 01/29/2023   HGB 12.7 01/29/2023   HCT 38.4 01/29/2023   MCV 94 01/29/2023   MCH 31.2 01/29/2023   RDW 12.5 01/29/2023   PLT 207 01/29/2023   Last metabolic panel Lab Results  Component Value Date   GLUCOSE 84 09/24/2022   NA 139 09/24/2022   K 3.9 09/24/2022   CL 103 09/24/2022   CO2 28 09/24/2022   BUN 16 09/24/2022   CREATININE 1.04 09/24/2022   GFR 74.10 09/24/2022   CALCIUM 9.4 09/24/2022   PROT 6.7 09/24/2022   ALBUMIN 4.5 09/24/2022   BILITOT 0.5 09/24/2022   ALKPHOS 42 09/24/2022   AST 15 09/24/2022   ALT 9 09/24/2022   ANIONGAP 11 01/30/2022   Last lipids Lab Results  Component Value Date   CHOL 137 07/26/2022   HDL 53.80 07/26/2022   LDLCALC 75 07/26/2022   TRIG 41.0 07/26/2022   CHOLHDL 3 07/26/2022   Last hemoglobin A1c Lab Results  Component Value Date   HGBA1C 5.2 03/19/2021   Last thyroid  functions Lab Results  Component Value Date   TSH 0.86 07/26/2022   Last vitamin D No results found for: "25OHVITD2", "25OHVITD3", "VD25OH" Last vitamin B12 and Folate No results found for: "VITAMINB12", "FOLATE"    The ASCVD Risk score (Arnett DK, et al., 2019) failed to calculate for the following reasons:   The 2019 ASCVD risk score is only valid for ages 39 to 72    Assessment & Plan:   Problem List Items Addressed This Visit   None Visit Diagnoses     Cellulitis of breast    -  Primary   Relevant Medications   cephALEXin (KEFLEX) 500 MG capsule     Assessment and  Plan    Nipple Piercing Complications Patient reports sharp, stabbing pain in nipples and white discharge from old piercing sites. No signs of infection such as fever, swelling, or redness. -Start Keflex to prevent potential infection. -Advise warm compresses for pain relief.  Pregnancy Patient is currently pregnant and plans to breastfeed. No complications reported. -Continue routine prenatal care. -Next OB/GYN appointment scheduled for 03/06/2023.        No follow-ups on file.    Donato Schultz, DO

## 2023-02-26 ENCOUNTER — Telehealth: Payer: Self-pay

## 2023-02-26 ENCOUNTER — Other Ambulatory Visit (HOSPITAL_BASED_OUTPATIENT_CLINIC_OR_DEPARTMENT_OTHER): Payer: Self-pay

## 2023-02-26 ENCOUNTER — Other Ambulatory Visit: Payer: Self-pay | Admitting: Family Medicine

## 2023-02-26 MED ORDER — CLOTRIMAZOLE 3 2 % VA CREA
1.0000 | TOPICAL_CREAM | Freq: Every day | VAGINAL | 0 refills | Status: DC
  Filled 2023-02-26: qty 21, fill #0

## 2023-02-26 NOTE — Telephone Encounter (Signed)
Pt seen in office yesterday with Lowne. Pt was put on Keflex and would like something for a yeast infection. Pt is currently pregnant. FYI

## 2023-02-26 NOTE — Telephone Encounter (Signed)
Pt made aware

## 2023-02-27 ENCOUNTER — Emergency Department (HOSPITAL_BASED_OUTPATIENT_CLINIC_OR_DEPARTMENT_OTHER)
Admission: EM | Admit: 2023-02-27 | Discharge: 2023-02-27 | Disposition: A | Discharge status: Home / Self Care | Payer: 59 | Attending: Emergency Medicine | Admitting: Emergency Medicine

## 2023-02-27 ENCOUNTER — Emergency Department (HOSPITAL_BASED_OUTPATIENT_CLINIC_OR_DEPARTMENT_OTHER): Payer: 59 | Admitting: Radiology

## 2023-02-27 ENCOUNTER — Other Ambulatory Visit: Payer: Self-pay

## 2023-02-27 ENCOUNTER — Encounter (HOSPITAL_BASED_OUTPATIENT_CLINIC_OR_DEPARTMENT_OTHER): Payer: Self-pay

## 2023-02-27 DIAGNOSIS — R0602 Shortness of breath: Secondary | ICD-10-CM | POA: Insufficient documentation

## 2023-02-27 DIAGNOSIS — Z91040 Latex allergy status: Secondary | ICD-10-CM | POA: Diagnosis not present

## 2023-02-27 DIAGNOSIS — R079 Chest pain, unspecified: Secondary | ICD-10-CM | POA: Insufficient documentation

## 2023-02-27 DIAGNOSIS — O26892 Other specified pregnancy related conditions, second trimester: Secondary | ICD-10-CM | POA: Diagnosis not present

## 2023-02-27 DIAGNOSIS — O99891 Other specified diseases and conditions complicating pregnancy: Secondary | ICD-10-CM | POA: Diagnosis not present

## 2023-02-27 DIAGNOSIS — R1013 Epigastric pain: Secondary | ICD-10-CM | POA: Diagnosis not present

## 2023-02-27 DIAGNOSIS — R0789 Other chest pain: Secondary | ICD-10-CM | POA: Diagnosis not present

## 2023-02-27 DIAGNOSIS — Z3A14 14 weeks gestation of pregnancy: Secondary | ICD-10-CM | POA: Diagnosis not present

## 2023-02-27 LAB — TROPONIN I (HIGH SENSITIVITY)
Troponin I (High Sensitivity): 2 ng/L (ref <18)
Troponin I (High Sensitivity): 2 ng/L (ref <18)

## 2023-02-27 LAB — COMPREHENSIVE METABOLIC PANEL
ALT: 12 U/L (ref 0–44)
AST: 18 U/L (ref 15–41)
Albumin: 4 g/dL (ref 3.5–5.0)
Alkaline Phosphatase: 37 U/L — ABNORMAL LOW (ref 38–126)
Anion gap: 9 (ref 5–15)
BUN: 10 mg/dL (ref 6–20)
CO2: 24 mmol/L (ref 22–32)
Calcium: 9.1 mg/dL (ref 8.9–10.3)
Chloride: 104 mmol/L (ref 98–111)
Creatinine, Ser: 0.7 mg/dL (ref 0.44–1.00)
GFR, Estimated: >60 mL/min (ref >60)
Glucose, Bld: 79 mg/dL (ref 70–99)
Potassium: 3.5 mmol/L (ref 3.5–5.1)
Sodium: 137 mmol/L (ref 135–145)
Total Bilirubin: 0.3 mg/dL (ref 0.3–1.2)
Total Protein: 6.5 g/dL (ref 6.5–8.1)

## 2023-02-27 LAB — LIPASE, BLOOD: Lipase: 25 U/L (ref 11–51)

## 2023-02-27 LAB — CBC
HCT: 34.1 % — ABNORMAL LOW (ref 36.0–46.0)
Hemoglobin: 12 g/dL (ref 12.0–15.0)
MCH: 32.5 pg (ref 26.0–34.0)
MCHC: 35.2 g/dL (ref 30.0–36.0)
MCV: 92.4 fL (ref 80.0–100.0)
Platelets: 185 10*3/uL (ref 150–400)
RBC: 3.69 MIL/uL — ABNORMAL LOW (ref 3.87–5.11)
RDW: 12.9 % (ref 11.5–15.5)
WBC: 10.9 10*3/uL — ABNORMAL HIGH (ref 4.0–10.5)
nRBC: 0 % (ref 0.0–0.2)

## 2023-02-27 LAB — D-DIMER, QUANTITATIVE: D-Dimer, Quant: <0.27 ug{FEU}/mL (ref 0.00–0.50)

## 2023-02-27 MED ORDER — LACTATED RINGERS IV BOLUS
1000.0000 mL | Freq: Once | INTRAVENOUS | Status: AC
  Administered 2023-02-27: 1000 mL via INTRAVENOUS

## 2023-02-27 MED ORDER — METOCLOPRAMIDE HCL 5 MG/ML IJ SOLN
10.0000 mg | Freq: Once | INTRAMUSCULAR | Status: AC
  Administered 2023-02-27: 10 mg via INTRAVENOUS
  Filled 2023-02-27: qty 2

## 2023-02-27 NOTE — ED Provider Notes (Signed)
[place] EMERGENCY DEPARTMENT AT [facility] Provider Note   CSN: [id number] Arrival date & time: 02/27/23  1215     History  Chief Complaint  Patient presents with   Chest Pain    Barbara Lutz is a 27 y.o. female.  27 year old female G1, P0 at [redacted]w[redacted]d who presents to the emergency department with chest pain and abdominal pain.  Patient reports that this morning she woke up and had some nausea and vomiting which has been normal for her.  Reports that shortly afterwards starting having a tingling painful sensation of her upper chest and upper abdomen.  Worsened with exertion.  Not with breathing.  Has mild shortness of breath.  Did have 2 episodes of nonbloody nonbilious emesis this morning.  No fever, cough, lower extremity swelling, history of DVT or PE, recent surgery.  No personal history of cardiac or lung disease.       Home Medications Prior to Admission medications   Medication Sig Start Date End Date Taking? Authorizing Provider  cephALEXin (KEFLEX) 500 MG capsule Take 1 capsule (500 mg total) by mouth 2 (two) times daily. 02/25/23   Donato Schultz, DO  clotrimazole (CLOTRIMAZOLE 3) 2 % vaginal cream Place 1 Applicatorful vaginally at bedtime. 02/26/23   Seabron Spates R, DO  ondansetron (ZOFRAN-ODT) 4 MG disintegrating tablet Take 1 tablet (4 mg total) by mouth every 6 (six) hours as needed for nausea. 01/29/23   Levie Heritage, DO  Prenatal Vit-Fe Fumarate-FA (MULTIVITAMIN-PRENATAL) 27-0.8 MG TABS tablet Take 1 tablet by mouth daily at 12 noon.    [provider]      Allergies    Latex and Nickel    Review of Systems   Review of Systems  Physical Exam Updated Vital Signs BP 129/80 (BP Location: Right Arm)   Pulse 84   Temp 98.6 F (37 C) (Oral)   Resp 14   Ht 5\' 2"  (1.575 m)   Wt 74.8 kg   LMP 11/24/2022   SpO2 99%   BMI 30.18 kg/m  Physical Exam Vitals and nursing note reviewed.  Constitutional:      General: She is not  in acute distress.    Appearance: She is well-developed.  HENT:     Head: Normocephalic and atraumatic.     Right Ear: External ear normal.     Left Ear: External ear normal.     Nose: Nose normal.  Eyes:     Extraocular Movements: Extraocular movements intact.     Conjunctiva/sclera: Conjunctivae normal.     Pupils: Pupils are equal, round, and reactive to light.  Cardiovascular:     Rate and Rhythm: Normal rate and regular rhythm.     Heart sounds: No murmur heard. Pulmonary:     Effort: Pulmonary effort is normal. No respiratory distress.     Breath sounds: Normal breath sounds.  Abdominal:     General: Abdomen is flat. There is no distension.     Palpations: Abdomen is soft. There is no mass.     Tenderness: There is abdominal tenderness (Epigastrium). There is no guarding.  Musculoskeletal:     Cervical back: Normal range of motion and neck supple.     Right lower leg: No edema.     Left lower leg: No edema.  Skin:    General: Skin is warm and dry.  Neurological:     Mental Status: She is alert and oriented to person, place, and time. Mental status is at  baseline.  Psychiatric:        Mood and Affect: Mood normal.     ED Results / Procedures / Treatments   Labs (all labs ordered are listed, but only abnormal results are displayed) Labs Reviewed  CBC - Abnormal; Notable for the following components:      Result Value   WBC 10.9 (*)    RBC 3.69 (*)    HCT 34.1 (*)    All other components within normal limits  COMPREHENSIVE METABOLIC PANEL - Abnormal; Notable for the following components:   Alkaline Phosphatase 37 (*)    All other components within normal limits  D-DIMER, QUANTITATIVE  LIPASE, BLOOD  TROPONIN I (HIGH SENSITIVITY)  TROPONIN I (HIGH SENSITIVITY)    EKG EKG Interpretation Date/Time:  Thursday February 27 2023 12:22:02 EDT Ventricular Rate:  76 PR Interval:  140 QRS Duration:  87 QT Interval:  381 QTC Calculation: 429 R Axis:   76  Text  Interpretation: Sinus rhythm Confirmed by Vonita Moss [phone]) on 02/27/2023 1:24:14 PM  Radiology DG Chest 2 View  Result Date: 02/27/2023 CLINICAL DATA:  chest pain EXAM: CHEST - 2 VIEW COMPARISON:  October 2023 FINDINGS: The cardiomediastinal silhouette is within normal limits. No pleural effusion. No pneumothorax. No mass or consolidation. No acute osseous abnormality. IMPRESSION: No acute findings in the chest. Electronically Signed   By: Olive Bass M.D.   On: 02/27/2023 14:42    Procedures Procedures    Medications Ordered in ED Medications  lactated ringers bolus 1,000 mL ( Intravenous Stopped 02/27/23 1422)  metoCLOPramide (REGLAN) injection 10 mg (10 mg Intravenous Given 02/27/23 1313)    ED Course/ Medical Decision Making/ A&P Clinical Course as of 02/27/23 1912  Thu Feb 27, 2023  1545 Received sign out from Dr. Eloise Harman pending delta troponin. Presented with chest pain. D-dimer negative. CXR clear. [WS]  1638 Delta troponin is negative. Will discharge patient to home. All questions answered. Patient comfortable with plan of discharge. Return precautions discussed with patient and specified on the after visit summary.  [WS]    Clinical Course User Index [WS] Lonell Grandchild, MD                                 Medical Decision Making Amount and/or Complexity of Data Reviewed Labs: ordered. Radiology: ordered.  Risk Prescription drug management.   Barbara Lutz is a 27 y.o. female with comorbidities that complicate the patient evaluation including G1, P0 at [redacted]w[redacted]d who presents to the emergency department with chest pain and abdominal pain.   Initial Ddx:  Reflux, esophagitis, Boerhaave's, MI, PE, cholecystitis, biliary colic, pneumonia  MDM/Course:  Patient presents to the emergency department with tingling and painful sensation of her upper chest and abdomen that started this morning.  In the setting of her nausea and vomiting could potentially represent  esophagitis.  Given her overall clinical appearance low concern for Boerhaave's.  With her shortness of breath, chest pain, and the fact that she is pregnant did send off a D-dimer which was WNL.  EKG and first troponin did not show any evidence of acute MI.  Chest x-ray without pneumonia or other acute findings.  Not having any significant tenderness to palpation in the right upper quadrant that would suggest cholecystitis.  Upon re-evaluation after receiving Reglan and fluids not have any additional nausea and repeated abdominal exam not show any acute findings.  Signed out to the  oncoming physician (Dr Suezanne Jacquet) awaiting repeat troponin.  This patient presents to the ED for concern of complaints listed in HPI, this involves an extensive number of treatment options, and is a complaint that carries with it a high risk of complications and morbidity. Disposition including potential need for admission considered.   Dispo: Pending remainder of workup  Records reviewed Outpatient Clinic Notes The following labs were independently interpreted: Chemistry and show no acute abnormality I independently reviewed the following imaging with scope of interpretation limited to determining acute life threatening conditions related to emergency care: Chest x-ray and agree with the radiologist interpretation with the following exceptions: none I personally reviewed and interpreted cardiac monitoring: normal sinus rhythm  I personally reviewed and interpreted the pt's EKG: see above for interpretation  I have reviewed the patients home medications and made adjustments as needed   Final Clinical Impression(s) / ED Diagnoses Final diagnoses:  Chest pain, unspecified type  Shortness of breath    Rx / DC Orders ED Discharge Orders     None         Rondel Baton, MD 02/27/23 1912

## 2023-02-27 NOTE — ED Notes (Signed)
Patient verbalizes understanding of discharge instructions. Opportunity for questioning and answers were provided. Armband removed by staff, pt discharged from ED.  

## 2023-02-27 NOTE — ED Triage Notes (Signed)
Pt presents with sudden onset of bilateral upper CP that is described as sharp and tingling that is also present in upper abd and radiates to lower back with waxing and waning symptoms. Pt reports associated ShOB and nausea at onset. Pt is [redacted] weeks pregnant with regular OB follow up.

## 2023-02-27 NOTE — Discharge Instructions (Addendum)
You were seen for your chest pain in the emergency department.   At home, take tylenol for your pain.   Follow-up with your primary doctor in 2-3 days regarding your visit.  Please also follow-up with your OB/GYN.  Return immediately to the emergency department if you experience any of the following: Worsening pain, difficulty breathing, unexplained vomiting or sweating, or any other concerning symptoms.    Thank you for visiting our Emergency Department. It was a pleasure taking care of you today.

## 2023-02-27 NOTE — ED Provider Notes (Signed)
   ED Course / MDM   Clinical Course as of 02/27/23 1638  Thu Feb 27, 2023  1545 Received sign out from Dr. Eloise Harman pending delta troponin. Presented with chest pain. D-dimer negative. CXR clear. [WS]  1638 Delta troponin is negative. Will discharge patient to home. All questions answered. Patient comfortable with plan of discharge. Return precautions discussed with patient and specified on the after visit summary.  [WS]    Clinical Course User Index [WS] Lonell Grandchild, MD   Medical Decision Making Amount and/or Complexity of Data Reviewed Labs: ordered. Radiology: ordered.  Risk Prescription drug management.          Lonell Grandchild, MD 02/27/23 [phone]

## 2023-03-06 ENCOUNTER — Telehealth (INDEPENDENT_AMBULATORY_CARE_PROVIDER_SITE_OTHER): Payer: 59 | Admitting: Family Medicine

## 2023-03-06 VITALS — Wt 166.0 lb

## 2023-03-06 DIAGNOSIS — Z3402 Encounter for supervision of normal first pregnancy, second trimester: Secondary | ICD-10-CM

## 2023-03-06 DIAGNOSIS — Z3A14 14 weeks gestation of pregnancy: Secondary | ICD-10-CM

## 2023-03-06 DIAGNOSIS — Z3400 Encounter for supervision of normal first pregnancy, unspecified trimester: Secondary | ICD-10-CM

## 2023-03-06 NOTE — Progress Notes (Signed)
   OBSTETRICS PRENATAL VIRTUAL VISIT ENCOUNTER NOTE  Provider location: [facility] for [facility]   Patient location: Home  I connected with Barbara Lutz on 03/06/23 at  3:10 PM EDT by MyChart Video Encounter and verified that I am speaking with the correct person using two identifiers. I discussed the limitations, risks, security and privacy concerns of performing an evaluation and management service virtually and the availability of in person appointments. I also discussed with the patient that there may be a patient responsible charge related to this service. The patient expressed understanding and agreed to proceed. Subjective:  Barbara Lutz is a 27 y.o. G1P0000 at [redacted]w[redacted]d being seen today for ongoing prenatal care.  She is currently monitored for the following issues for this low-risk pregnancy and has History of pyelonephritis; Nicotine dependence, cigarettes, uncomplicated; Obesity (BMI 30-39.9); GAD (generalized anxiety disorder); Abnormal urine cytology; Dysmenorrhea; and Supervision of normal first pregnancy, antepartum on their problem list.  Patient reports no complaints.  Contractions: Not present. Vag. Bleeding: None.  Movement: Absent. Denies any leaking of fluid.   The following portions of the patient's history were reviewed and updated as appropriate: allergies, current medications, past family history, past medical history, past social history, past surgical history and problem list.   Objective:   Vitals:   03/06/23 1505  Weight: 166 lb (75.3 kg)    Fetal Status:     Movement: Absent     General:  Alert, oriented and cooperative. Patient is in no acute distress.  Respiratory: Normal respiratory effort, no problems with respiration noted  Mental Status: Normal mood and affect. Normal behavior. Normal judgment and thought content.  Rest of physical exam deferred due to type of encounter  Imaging: DG Chest 2 View  Result Date:  02/27/2023 CLINICAL DATA:  chest pain EXAM: CHEST - 2 VIEW COMPARISON:  October 2023 FINDINGS: The cardiomediastinal silhouette is within normal limits. No pleural effusion. No pneumothorax. No mass or consolidation. No acute osseous abnormality. IMPRESSION: No acute findings in the chest. Electronically Signed   By: Olive Bass M.D.   On: 02/27/2023 14:42    Assessment and Plan:  Pregnancy: G1P0000 at [redacted]w[redacted]d 1. Supervision of normal first pregnancy, antepartum Has COVID - use mucinex for congestion.  Preterm labor symptoms and general obstetric precautions including but not limited to vaginal bleeding, contractions, leaking of fluid and fetal movement were reviewed in detail with the patient. I discussed the assessment and treatment plan with the patient. The patient was provided an opportunity to ask questions and all were answered. The patient agreed with the plan and demonstrated an understanding of the instructions. The patient was advised to call back or seek an in-person office evaluation/go to [facility] at [facility] for any urgent or concerning symptoms. Please refer to After Visit Summary for other counseling recommendations.   I provided 6 minutes of face-to-face time during this encounter.  No follow-ups on file.  Future Appointments  Date Time Provider Department Center  03/10/2023  8:30 AM [facility] NURSE [facility] None  03/24/2023  5:00 PM Bauert, Rella Larve, LCSW [facility] None  03/31/2023  8:15 AM Adam Phenix, MD [facility] None  04/07/2023  9:15 AM [facility] NURSE [facility]  04/07/2023  9:30 AM [facility] US1 [facility]    Levie Heritage, DO Center for [employer], [facility]

## 2023-03-10 ENCOUNTER — Ambulatory Visit: Payer: 59

## 2023-03-10 VITALS — BP 106/68 | HR 90 | Wt 164.0 lb

## 2023-03-10 DIAGNOSIS — Z3400 Encounter for supervision of normal first pregnancy, unspecified trimester: Secondary | ICD-10-CM

## 2023-03-10 NOTE — Progress Notes (Addendum)
Pt presents for heat tones. Pt recently had COVID and she had some concerns because her last visit was virtual. Baby's heart rate was140. Shakora Nordquist l Kerianne Gurr, [name]

## 2023-03-24 ENCOUNTER — Ambulatory Visit (INDEPENDENT_AMBULATORY_CARE_PROVIDER_SITE_OTHER): Payer: 59 | Admitting: Psychology

## 2023-03-24 DIAGNOSIS — F4323 Adjustment disorder with mixed anxiety and depressed mood: Secondary | ICD-10-CM

## 2023-03-24 NOTE — Progress Notes (Signed)
[facility] Counselor/Therapist Progress Note  Patient ID: Barbara Lutz, MRN: [id number],    Date: 03/24/2023  Time Spent: 5:00pm-5:55pm   55 minutes   Treatment Type: Individual Therapy  Reported Symptoms: stress  Mental Status Exam: Appearance:  Casual     Behavior: Appropriate  Motor: Normal  Speech/Language:  Normal Rate  Affect: Appropriate  Mood: normal  Thought process: normal  Thought content:   WNL  Sensory/Perceptual disturbances:   WNL  Orientation: oriented to person, place, time/date, and situation  Attention: Good  Concentration: Good  Memory: WNL  Fund of knowledge:  Good  Insight:   Good  Judgment:  Good  Impulse Control: Good   Risk Assessment: Danger to Self:  No Self-injurious Behavior: No Danger to Others: No Duty to Warn:no Physical Aggression / Violence:No  Access to Firearms a concern: No  Gang Involvement:No   Subjective: Pt present for face-to-face individual therapy via video.  Pt consents to telehealth video session and is aware of limitations and benefits of virtual session.  Location of pt: home Location of therapist: home office.   Pt talked about the stress of the last month.  Pt had her court date on October 2nd.  Pt is off probation and no longer has to pay money.  Pt is so relieved.    She had her final exam for school October 3rd and passed and did very well.   Pt was offered jobs at all 3 she applied for.   She chose her first choice and is now working as a Clinical biochemist at [employer].   Pt talked about her pregnancy.   The baby's father is still not talking to her.   Pt has been dealing with the disappointment of how he has treated her.  Helped pt process her feelings and relationship dynamics.   Worked on self care strategies. Provided supportive therapy.   Interventions: Cognitive Behavioral Therapy and Insight-Oriented  Diagnosis: F43.23  Plan of Care: Recommend ongoing therapy.   Pt participated in setting treatment  goals.   Pt wants to improve coping skills and cope without using weed or getting angry.   Pt wants to manage stress better.    Plan to meet monthly.   Pt agrees with treatment plan.    Treatment Plan (Treatment Plan Target Date:  02/20/2024) Client Abilities/Strengths  Pt is bright, engaging, and motivated for therapy.  Client Treatment Preferences  Individual therapy.  Client Statement of Needs  Improve copings skills and understand herself better. Improve self esteem.  Symptoms  Depressed or irritable mood. Excessive and/or unrealistic worry that is difficult to control occurring more days than not for at least 6 months about a number of events or activities. Hypervigilance (e.g., feeling constantly on edge, experiencing concentration difficulties, having trouble falling or staying asleep, exhibiting a general state of irritability). Low self-esteem. Problems Addressed  Unipolar Depression, Anxiety Goals 1. Alleviate depressive symptoms and return to previous level of effective functioning. 2. Appropriately grieve the loss in order to normalize mood and to return to previously adaptive level of functioning. Objective Learn and implement behavioral strategies to overcome depression. Target Date: 2024-02-20 Frequency: Monthly  Progress: 30 Modality: individual  Related Interventions Assist the client in developing skills that increase the likelihood of deriving pleasure from behavioral activation (e.g., assertiveness skills, developing an exercise plan, less internal/more external focus, increased social involvement); reinforce success. Engage the client in "behavioral activation," increasing his/her activity level and contact with sources of reward, while identifying processes  that inhibit activation. use behavioral techniques such as instruction, rehearsal, role-playing, role reversal, as needed, to facilitate activity in the client's daily life; reinforce success. 3. Develop healthy  interpersonal relationships that lead to the alleviation and help prevent the relapse of depression. 4. Develop healthy thinking patterns and beliefs about self, others, and the world that lead to the alleviation and help prevent the relapse of depression. 5. Enhance ability to effectively cope with the full variety of life's worries and anxieties. 6. Learn and implement coping skills that result in a reduction of anxiety and worry, and improved daily functioning. Objective Learn and implement problem-solving strategies for realistically addressing worries. Target Date: 2024-02-20 Frequency: Monthly  Progress: 30 Modality: individual  Related Interventions Assign the client a homework exercise in which he/she problem-solves a current problem (see Mastery of Your Anxiety and Worry: Workbook by Elenora Fender and Filbert Schilder or Generalized Anxiety Disorder by Elesa Hacker, and Filbert Schilder); review, reinforce success, and provide corrective feedback toward improvement. Teach the client problem-solving strategies involving specifically defining a problem, generating options for addressing it, evaluating the pros and cons of each option, selecting and implementing an optional action, and reevaluating and refining the action. Objective Learn and implement calming skills to reduce overall anxiety and manage anxiety symptoms. Target Date: 2024-02-20 Frequency: Monthly  Progress: 30 Modality: individual  Related Interventions Assign the client to read about progressive muscle relaxation and other calming strategies in relevant books or treatment manuals (e.g., Progressive Relaxation Training by Twana First; Mastery of Your Anxiety and Worry: Workbook by Earlie Counts). Assign the client homework each session in which he/she practices relaxation exercises daily, gradually applying them progressively from non-anxiety-provoking to anxiety-provoking situations; review and reinforce success while providing  corrective feedback toward improvement. Teach the client calming/relaxation skills (e.g., applied relaxation, progressive muscle relaxation, cue controlled relaxation; mindful breathing; biofeedback) and how to discriminate better between relaxation and tension; teach the client how to apply these skills to his/her daily life. 7. Recognize, accept, and cope with feelings of depression. 8. Reduce overall frequency, intensity, and duration of the anxiety so that daily functioning is not impaired. 9. Resolve the core conflict that is the source of anxiety. 10. Stabilize anxiety level while increasing ability to function on a daily basis. Diagnosis F43.23 Conditions For Discharge Achievement of treatment goals and objectives   Salomon Fick, LCSW

## 2023-03-31 ENCOUNTER — Ambulatory Visit (INDEPENDENT_AMBULATORY_CARE_PROVIDER_SITE_OTHER): Payer: 59 | Admitting: Obstetrics & Gynecology

## 2023-03-31 VITALS — BP 103/62 | HR 83 | Wt 170.0 lb

## 2023-03-31 DIAGNOSIS — Z3A18 18 weeks gestation of pregnancy: Secondary | ICD-10-CM | POA: Diagnosis not present

## 2023-03-31 DIAGNOSIS — E669 Obesity, unspecified: Secondary | ICD-10-CM

## 2023-03-31 DIAGNOSIS — F17210 Nicotine dependence, cigarettes, uncomplicated: Secondary | ICD-10-CM

## 2023-03-31 DIAGNOSIS — Z3400 Encounter for supervision of normal first pregnancy, unspecified trimester: Secondary | ICD-10-CM

## 2023-03-31 DIAGNOSIS — Z87448 Personal history of other diseases of urinary system: Secondary | ICD-10-CM

## 2023-03-31 DIAGNOSIS — F411 Generalized anxiety disorder: Secondary | ICD-10-CM

## 2023-03-31 NOTE — Progress Notes (Signed)
   PRENATAL VISIT NOTE  Subjective:  Barbara Lutz is a 27 y.o. G1P0000 at [redacted]w[redacted]d being seen today for ongoing prenatal care.  She is currently monitored for the following issues for this low-risk pregnancy and has History of pyelonephritis; Nicotine dependence, cigarettes, uncomplicated; Obesity (BMI 30-39.9)-pre-G BMI 29.81; GAD (generalized anxiety disorder); Abnormal urine cytology; Dysmenorrhea; and Supervision of normal first pregnancy, antepartum on their problem list.  Patient reports no complaints.  Contractions: Not present. Vag. Bleeding: None.  Movement: Absent. Denies leaking of fluid.   The following portions of the patient's history were reviewed and updated as appropriate: allergies, current medications, past family history, past medical history, past social history, past surgical history and problem list.   Objective:   Vitals:   03/31/23 0807  BP: 103/62  Pulse: 83  Weight: 170 lb (77.1 kg)    Fetal Status: Fetal Heart Rate (bpm): 140   Movement: Absent     General:  Alert, oriented and cooperative. Patient is in no acute distress.  Skin: Skin is warm and dry. No rash noted.   Cardiovascular: Normal heart rate noted  Respiratory: Normal respiratory effort, no problems with respiration noted  Abdomen: Soft, gravid, appropriate for gestational age.  Pain/Pressure: Present     Pelvic: Cervical exam deferred        Extremities: Normal range of motion.  Edema: None  Mental Status: Normal mood and affect. Normal behavior. Normal judgment and thought content.   Assessment and Plan:  Pregnancy: G1P0000 at [redacted]w[redacted]d 1. [redacted] weeks gestation of pregnancy  - AFP, Serum, Open Spina Bifida  2. Obesity (BMI 30-39.9)-pre-G BMI 29.81 Body mass index is 31.09 kg/m.   3. Supervision of normal first pregnancy, antepartum  - AFP, Serum, Open Spina Bifida  4. History of pyelonephritis   5. GAD (generalized anxiety disorder)   6. Nicotine dependence, cigarettes,  uncomplicated   Preterm labor symptoms and general obstetric precautions including but not limited to vaginal bleeding, contractions, leaking of fluid and fetal movement were reviewed in detail with the patient. Please refer to After Visit Summary for other counseling recommendations.   Return in about 4 weeks (around 04/28/2023).  Future Appointments  Date Time Provider Department Center  04/07/2023  9:15 AM [facility] NURSE [facility]  04/07/2023  9:30 AM [facility] US1 [facility]  05/14/2023 10:15 AM Levie Heritage, DO [facility] None  05/19/2023  5:00 PM Bauert, Rella Larve, LCSW [facility] None    Scheryl Darter, MD

## 2023-04-02 LAB — AFP, SERUM, OPEN SPINA BIFIDA
AFP MoM: 1.4
AFP Value: 55.4 ng/mL
Gest. Age on Collection Date: 18 wk
Maternal Age At EDD: 27.5 a
OSBR Risk 1 IN: 3600
Test Results:: NEGATIVE
Weight: 170 [lb_av]

## 2023-04-07 ENCOUNTER — Ambulatory Visit: Payer: 59 | Admitting: *Deleted

## 2023-04-07 ENCOUNTER — Other Ambulatory Visit: Payer: Self-pay | Admitting: *Deleted

## 2023-04-07 ENCOUNTER — Other Ambulatory Visit: Payer: Self-pay

## 2023-04-07 ENCOUNTER — Encounter: Payer: Self-pay | Admitting: *Deleted

## 2023-04-07 ENCOUNTER — Ambulatory Visit: Payer: 59 | Attending: Family Medicine

## 2023-04-07 VITALS — BP 129/63 | HR 101

## 2023-04-07 DIAGNOSIS — Z3689 Encounter for other specified antenatal screening: Secondary | ICD-10-CM | POA: Insufficient documentation

## 2023-04-07 DIAGNOSIS — O3662X0 Maternal care for excessive fetal growth, second trimester, not applicable or unspecified: Secondary | ICD-10-CM | POA: Diagnosis not present

## 2023-04-07 DIAGNOSIS — Z3400 Encounter for supervision of normal first pregnancy, unspecified trimester: Secondary | ICD-10-CM | POA: Diagnosis not present

## 2023-04-07 DIAGNOSIS — Z3A19 19 weeks gestation of pregnancy: Secondary | ICD-10-CM | POA: Insufficient documentation

## 2023-04-07 DIAGNOSIS — Z363 Encounter for antenatal screening for malformations: Secondary | ICD-10-CM | POA: Diagnosis not present

## 2023-04-07 DIAGNOSIS — Z362 Encounter for other antenatal screening follow-up: Secondary | ICD-10-CM

## 2023-04-07 DIAGNOSIS — O321XX0 Maternal care for breech presentation, not applicable or unspecified: Secondary | ICD-10-CM | POA: Insufficient documentation

## 2023-04-15 ENCOUNTER — Other Ambulatory Visit (INDEPENDENT_AMBULATORY_CARE_PROVIDER_SITE_OTHER): Payer: 59

## 2023-04-15 ENCOUNTER — Other Ambulatory Visit: Payer: Self-pay

## 2023-04-15 ENCOUNTER — Other Ambulatory Visit (HOSPITAL_COMMUNITY)
Admission: RE | Admit: 2023-04-15 | Discharge: 2023-04-15 | Disposition: A | Discharge status: Home / Self Care | Payer: 59 | Source: Ambulatory Visit | Attending: Family Medicine | Admitting: Family Medicine

## 2023-04-15 DIAGNOSIS — N898 Other specified noninflammatory disorders of vagina: Secondary | ICD-10-CM

## 2023-04-15 DIAGNOSIS — R8290 Unspecified abnormal findings in urine: Secondary | ICD-10-CM

## 2023-04-15 LAB — POC URINALSYSI DIPSTICK (AUTOMATED)
Bilirubin, UA: NEGATIVE
Blood, UA: NEGATIVE
Glucose, UA: NEGATIVE
Ketones, UA: NEGATIVE
Nitrite, UA: NEGATIVE
Protein, UA: NEGATIVE
Spec Grav, UA: 1.01 (ref 1.010–1.025)
Urobilinogen, UA: 0.2 U/dL
pH, UA: 6 (ref 5.0–8.0)

## 2023-04-15 NOTE — Addendum Note (Signed)
Addended by: Mervin Kung A on: 04/15/2023 03:56 PM   Modules accepted: Orders

## 2023-04-15 NOTE — Progress Notes (Signed)
Specimen drop off

## 2023-04-15 NOTE — Addendum Note (Signed)
Addended by: Roxanne Gates on: 04/15/2023 03:33 PM   Modules accepted: Orders

## 2023-04-16 LAB — URINE CULTURE
MICRO NUMBER:: 15661397
Result:: NO GROWTH
SPECIMEN QUALITY:: ADEQUATE

## 2023-04-17 LAB — CERVICOVAGINAL ANCILLARY ONLY
Bacterial Vaginitis (gardnerella): NEGATIVE
Candida Glabrata: NEGATIVE
Candida Vaginitis: POSITIVE — AB
Chlamydia: NEGATIVE
Comment: NEGATIVE
Comment: NEGATIVE
Comment: NEGATIVE
Comment: NEGATIVE
Comment: NEGATIVE
Comment: NORMAL
Neisseria Gonorrhea: NEGATIVE
Trichomonas: NEGATIVE

## 2023-04-22 ENCOUNTER — Encounter: Payer: Self-pay | Admitting: Family Medicine

## 2023-04-22 ENCOUNTER — Telehealth: Payer: 59 | Admitting: Family Medicine

## 2023-04-22 VITALS — Temp 98.9°F

## 2023-04-22 DIAGNOSIS — K529 Noninfective gastroenteritis and colitis, unspecified: Secondary | ICD-10-CM | POA: Diagnosis not present

## 2023-04-22 HISTORY — DX: Noninfective gastroenteritis and colitis, unspecified: K52.9

## 2023-04-22 MED ORDER — PROMETHAZINE HCL 50 MG/ML IJ SOLN
50.0000 mg | Freq: Once | INTRAMUSCULAR | Status: AC
Start: 2023-04-22 — End: 2023-04-22
  Administered 2023-04-22: 50 mg via INTRAMUSCULAR

## 2023-04-22 NOTE — Progress Notes (Signed)
MyChart Video Visit    Virtual Visit via Video Note   This patient is at least at moderate risk for complications without adequate follow up. This format is felt to be most appropriate for this patient at this time. Physical exam was limited by quality of the video and audio technology used for the visit. Herbert Seta was able to get the patient set up on a video visit.  Patient location: home Patient and provider in visit Provider location: Office  I discussed the limitations of evaluation and management by telemedicine and the availability of in person appointments. The patient expressed understanding and agreed to proceed.  Visit Date: 04/22/2023  Today's healthcare provider: Donato Schultz, DO     Subjective:    Patient ID: Barbara Lutz, female    DOB: Sep 15, 1995, 27 y.o.   MRN: [id number]  Chief Complaint  Patient presents with   GI Problem    Pt states states having nausea/vomiting last night. Pt states having diarrhea and bubble guts.     HPI Patient is in today for nausea, vomiting and diarrhea=--  [redacted]weeks pregnant Discussed the use of AI scribe software for clinical note transcription with the patient, who gave verbal consent to proceed.  History of Present Illness   The patient, who is pregnant, presents with acute onset of vomiting and diarrhea. She reports that the symptoms began the previous night and continued until early morning. The vomiting was severe, progressing to the point of vomiting acid. Despite taking Zofran, the patient felt that she was unable to retain the medication due to the severity of the vomiting. She also reports a sensation of her stomach 'bubbling' and ongoing diarrhea. She has been able to tolerate a bowl of cereal without vomiting, but continues to feel unwell. She denies fever but reports sweating. She has been drinking water to stay hydrated.       Past Medical History:  Diagnosis Date   Anxiety    COVID-19    Depression     Depression, major, recurrent, moderate (HCC) 05/25/2012   History of pyelonephritis 07/23/2018   Mucocele of lip 01/2020   Obesity (BMI 30-39.9) 07/23/2018   [facility]   Pityriasis versicolor 03/19/2021   Recurrent UTI 07/23/2018   Thyroid disorder     Past Surgical History:  Procedure Laterality Date   ORAL MUCOCELE EXCISION      Family History  Problem Relation Age of Onset   COPD Mother    Hypertension Mother    Heart attack Father    High Cholesterol Father    Depression Sister    Diabetes Maternal Grandfather    Hearing loss Maternal Grandfather    Heart attack Maternal Grandfather    Heart disease Maternal Grandfather    Hypertension Maternal Grandfather    Hyperlipidemia Maternal Grandfather    ADD / ADHD Paternal Grandfather    Alcohol abuse Paternal Grandfather    COPD Paternal Grandfather    Diabetes Paternal Grandfather    Hearing loss Paternal Grandfather    Hyperlipidemia Paternal Grandfather    Hypertension Paternal Grandfather    Stroke Paternal Grandfather    Asthma Sister    Depression Sister    Drug abuse Sister    Breast cancer Maternal Grandmother    Pancreatic cancer Paternal Grandmother    Arthritis Paternal Grandmother    Hypertension Paternal Grandmother     Social History   Socioeconomic History   Marital status: Single    Spouse name: Not  on file   Number of children: Not on file   Years of education: Not on file   Highest education level: GED or equivalent  Occupational History   Not on file  Tobacco Use   Smoking status: Former    Types: Cigarettes   Smokeless tobacco: Never  Vaping Use   Vaping status: Every Day   Substances: Nicotine  Substance and Sexual Activity   Alcohol use: Not Currently    Comment: social   Drug use: No   Sexual activity: Yes    Partners: Male    Birth control/protection: None  Other Topics Concern   Not on file  Social History Narrative   Marital status/children/pets: Single.    Education/employment: GED.  Employed. Manager.   Safety:      -smoke alarm in the home:Yes     - wears seatbelt: Yes     - Feels safe in their relationships: Yes   Social Determinants of Health   Financial Resource Strain: Low Risk  (12/26/2022)   Overall Financial Resource Strain (CARDIA)    Difficulty of Paying Living Expenses: Not very hard  Food Insecurity: No Food Insecurity (12/26/2022)   Hunger Vital Sign    Worried About Running Out of Food in the Last Year: Never true    Ran Out of Food in the Last Year: Never true  Transportation Needs: No Transportation Needs (12/26/2022)   PRAPARE - Administrator, Civil Service (Medical): No    Lack of Transportation (Non-Medical): No  Physical Activity: Insufficiently Active (12/26/2022)   Exercise Vital Sign    Days of Exercise per Week: 3 days    Minutes of Exercise per Session: 30 min  Stress: No Stress Concern Present (12/26/2022)   [employer] of Occupational Health - Occupational Stress Questionnaire    Feeling of Stress : Only a little  Social Connections: Moderately Isolated (12/26/2022)   Social Connection and Isolation Panel [NHANES]    Frequency of Communication with Friends and Family: More than three times a week    Frequency of Social Gatherings with Friends and Family: More than three times a week    Attends Religious Services: More than 4 times per year    Active Member of [employer] or Organizations: No    Attends Engineer, structural: Not on file    Marital Status: Never married  Intimate Partner Violence: Unknown (09/18/2021)   Received from [employer], [facility]   HITS    Physically Hurt: Not on file    Insult or Talk Down To: Not on file    Threaten Physical Harm: Not on file    Scream or Curse: Not on file    Outpatient Medications Prior to Visit  Medication Sig Dispense Refill   ondansetron (ZOFRAN-ODT) 4 MG disintegrating tablet Take 1 tablet (4 mg total) by mouth every 6 (six)  hours as needed for nausea. 30 tablet 3   Prenatal Vit-Fe Fumarate-FA (MULTIVITAMIN-PRENATAL) 27-0.8 MG TABS tablet Take 1 tablet by mouth daily at 12 noon.     No facility-administered medications prior to visit.    Allergies  Allergen Reactions   Latex Rash   Nickel Dermatitis    Review of Systems  Constitutional:  Negative for fever and malaise/fatigue.  HENT:  Negative for congestion.   Eyes:  Negative for blurred vision.  Respiratory:  Negative for cough and shortness of breath.   Cardiovascular:  Negative for chest pain, palpitations and leg swelling.  Gastrointestinal:  Positive  for diarrhea, nausea and vomiting. Negative for abdominal pain.  Musculoskeletal:  Negative for back pain.  Skin:  Negative for rash.  Neurological:  Negative for loss of consciousness and headaches.       Objective:    Physical Exam Vitals and nursing note reviewed.  Constitutional:      General: She is not in acute distress. Pulmonary:     Effort: Pulmonary effort is normal.     Temp 98.9 F (37.2 C)   LMP 11/24/2022  Wt Readings from Last 3 Encounters:  03/31/23 170 lb (77.1 kg)  03/10/23 164 lb (74.4 kg)  03/06/23 166 lb (75.3 kg)       Assessment & Plan:  Gastroenteritis Assessment & Plan: Phenergan 50 mg IM to be given later today Zofran odt after that To er if symptoms do not subside Pedialyte   Orders: -     Promethazine HCl   Assessment and Plan    Gastroenteritis Acute onset of vomiting and diarrhea. No fever. Zofran ineffective due to vomiting. Patient is pregnant, limiting medication options. -Administer Phenergan shot in office today. -Advise BRAT diet (bananas, rice, applesauce, toast) and clear liquids. -Recommend Pedialyte for electrolyte replacement. -Continue Zofran as tolerated after Phenergan shot.        I discussed the assessment and treatment plan with the patient. The patient was provided an opportunity to ask questions and all were answered.  The patient agreed with the plan and demonstrated an understanding of the instructions.   The patient was advised to call back or seek an in-person evaluation if the symptoms worsen or if the condition fails to improve as anticipated.  Donato Schultz, DO [facility] at [facility] [phone] (phone) [phone] (fax)  [facility]

## 2023-04-22 NOTE — Assessment & Plan Note (Signed)
Phenergan 50 mg IM to be given later today Zofran odt after that To er if symptoms do not subside Pedialyte

## 2023-05-05 DIAGNOSIS — O3660X0 Maternal care for excessive fetal growth, unspecified trimester, not applicable or unspecified: Secondary | ICD-10-CM

## 2023-05-05 HISTORY — DX: Maternal care for excessive fetal growth, unspecified trimester, not applicable or unspecified: O36.60X0

## 2023-05-06 ENCOUNTER — Telehealth: Payer: 59 | Admitting: Family Medicine

## 2023-05-06 ENCOUNTER — Other Ambulatory Visit: Payer: 59

## 2023-05-06 ENCOUNTER — Ambulatory Visit (INDEPENDENT_AMBULATORY_CARE_PROVIDER_SITE_OTHER): Payer: 59

## 2023-05-06 ENCOUNTER — Telehealth (INDEPENDENT_AMBULATORY_CARE_PROVIDER_SITE_OTHER): Payer: 59 | Admitting: Family

## 2023-05-06 DIAGNOSIS — J029 Acute pharyngitis, unspecified: Secondary | ICD-10-CM

## 2023-05-06 HISTORY — DX: Acute pharyngitis, unspecified: J02.9

## 2023-05-06 LAB — POCT RAPID STREP A (OFFICE): Rapid Strep A Screen: NEGATIVE

## 2023-05-06 NOTE — Addendum Note (Signed)
Addended by: Mervin Kung A on: 05/06/2023 10:46 AM   Modules accepted: Orders

## 2023-05-06 NOTE — Progress Notes (Signed)
MyChart Video Visit    Virtual Visit via Video Note    Patient location: Home. Patient and provider in visit Provider location: Office  I discussed the limitations of evaluation and management by telemedicine and the availability of in person appointments. The patient expressed understanding and agreed to proceed.  Visit Date: 05/06/2023  Today's healthcare provider: Lemont Fillers, NP     Subjective:    Patient ID: Barbara Lutz, female    DOB: [birth year], 27 y.o.   MRN: [id number]  Chief Complaint  Patient presents with   fever, sore throat    Sxs started yesterday. Tem has reached 100.1 this morning. She has taken Tylenol. At home covid/flu test was negative    HPI   Patient is a 27 yr old female who presents today with c/o fever/sore throat.  She states that she woke at 2:30 AM this AM with sore throat.  Temp was 100.1.  Had a covid/flu A/Flu B test at home and all 3 were negative.  Notes a burning sensation through her sinuses/throat area and bad taste in her throat.  Throat is scratchy. She does not see any exudates on her tonsils.  She is currently [redacted] weeks pregnant.  Past Medical History:  Diagnosis Date   Anxiety    COVID-19    Depression    Depression, major, recurrent, moderate (HCC) 05/25/2012   History of pyelonephritis 07/23/2018   Mucocele of lip 01/2020   Obesity (BMI 30-39.9) 07/23/2018   [facility]   Pityriasis versicolor 03/19/2021   Recurrent UTI 07/23/2018   Thyroid disorder     Past Surgical History:  Procedure Laterality Date   ORAL MUCOCELE EXCISION      Family History  Problem Relation Age of Onset   COPD Mother    Hypertension Mother    Heart attack Father    High Cholesterol Father    Depression Sister    Diabetes Maternal Grandfather    Hearing loss Maternal Grandfather    Heart attack Maternal Grandfather    Heart disease Maternal Grandfather    Hypertension Maternal Grandfather    Hyperlipidemia  Maternal Grandfather    ADD / ADHD Paternal Grandfather    Alcohol abuse Paternal Grandfather    COPD Paternal Grandfather    Diabetes Paternal Grandfather    Hearing loss Paternal Grandfather    Hyperlipidemia Paternal Grandfather    Hypertension Paternal Grandfather    Stroke Paternal Grandfather    Asthma Sister    Depression Sister    Drug abuse Sister    Breast cancer Maternal Grandmother    Pancreatic cancer Paternal Grandmother    Arthritis Paternal Grandmother    Hypertension Paternal Grandmother     Social History   Socioeconomic History   Marital status: Single    Spouse name: Not on file   Number of children: Not on file   Years of education: Not on file   Highest education level: GED or equivalent  Occupational History   Not on file  Tobacco Use   Smoking status: Former    Types: Cigarettes   Smokeless tobacco: Never  Vaping Use   Vaping status: Every Day   Substances: Nicotine  Substance and Sexual Activity   Alcohol use: Not Currently    Comment: social   Drug use: No   Sexual activity: Yes    Partners: Male    Birth control/protection: None  Other Topics Concern   Not on file  Social History Narrative  Marital status/children/pets: Single.   Education/employment: GED.  Employed. Manager.   Safety:      -smoke alarm in the home:Yes     - wears seatbelt: Yes     - Feels safe in their relationships: Yes   Social Determinants of Health   Financial Resource Strain: Low Risk  (12/26/2022)   Overall Financial Resource Strain (CARDIA)    Difficulty of Paying Living Expenses: Not very hard  Food Insecurity: No Food Insecurity (12/26/2022)   Hunger Vital Sign    Worried About Running Out of Food in the Last Year: Never true    Ran Out of Food in the Last Year: Never true  Transportation Needs: No Transportation Needs (12/26/2022)   PRAPARE - Administrator, Civil Service (Medical): No    Lack of Transportation (Non-Medical): No  Physical  Activity: Insufficiently Active (12/26/2022)   Exercise Vital Sign    Days of Exercise per Week: 3 days    Minutes of Exercise per Session: 30 min  Stress: No Stress Concern Present (12/26/2022)   [employer] of Occupational Health - Occupational Stress Questionnaire    Feeling of Stress : Only a little  Social Connections: Moderately Isolated (12/26/2022)   Social Connection and Isolation Panel [NHANES]    Frequency of Communication with Friends and Family: More than three times a week    Frequency of Social Gatherings with Friends and Family: More than three times a week    Attends Religious Services: More than 4 times per year    Active Member of [employer] or Organizations: No    Attends Engineer, structural: Not on file    Marital Status: Never married  Intimate Partner Violence: Unknown (09/18/2021)   Received from [employer], [facility]   HITS    Physically Hurt: Not on file    Insult or Talk Down To: Not on file    Threaten Physical Harm: Not on file    Scream or Curse: Not on file    Outpatient Medications Prior to Visit  Medication Sig Dispense Refill   ondansetron (ZOFRAN-ODT) 4 MG disintegrating tablet Take 1 tablet (4 mg total) by mouth every 6 (six) hours as needed for nausea. 30 tablet 3   Prenatal Vit-Fe Fumarate-FA (MULTIVITAMIN-PRENATAL) 27-0.8 MG TABS tablet Take 1 tablet by mouth daily at 12 noon.     No facility-administered medications prior to visit.    Allergies  Allergen Reactions   Latex Rash   Nickel Dermatitis    ROS See HPI    Objective:    Physical Exam  LMP 11/24/2022  Wt Readings from Last 3 Encounters:  03/31/23 170 lb (77.1 kg)  03/10/23 164 lb (74.4 kg)  03/06/23 166 lb (75.3 kg)   Gen: Awake, alert, no acute distress- appears tired Resp: Breathing is even and non-labored Psych: calm/pleasant demeanor Neuro: Alert and Oriented x 3, + facial symmetry, speech is clear.      Assessment & Plan:   Problem List Items  Addressed This Visit       Unprioritized   Sore throat - Primary    Will bring patient in for rapid strep test and plan a send out if negative.  If positive with plan rx with antibiotics. I did recommend that she retest for Covid in 2-3 days.    Recommended rest, hydration, tylenol prn, salt water gargles, lozenges prn comfort. Call if symptoms worsen or if symptoms do not improve.       Addendum:  Rapid strep negative, swab will be sent for PCR.  Continue supportive measures as above.   I am having [facility] maintain her multivitamin-prenatal and ondansetron.  No orders of the defined types were placed in this encounter.   I discussed the assessment and treatment plan with the patient. The patient was provided an opportunity to ask questions and all were answered. The patient agreed with the plan and demonstrated an understanding of the instructions.   The patient was advised to call back or seek an in-person evaluation if the symptoms worsen or if the condition fails to improve as anticipated.  Lemont Fillers, NP [facility] [phone] (phone) [phone] (fax)  [facility]

## 2023-05-06 NOTE — Progress Notes (Signed)
Pt had a Virtual visit today with Sandford Craze.  She then ordered a POCT strep test.   POCT strep was negative. Culture was sent out.   Wellington Hampshire, RMA

## 2023-05-06 NOTE — Assessment & Plan Note (Signed)
Will bring patient in for rapid strep test and plan a send out if negative.  If positive with plan rx with antibiotics. I did recommend that she retest for Covid in 2-3 days.    Recommended rest, hydration, tylenol prn, salt water gargles, lozenges prn comfort. Call if symptoms worsen or if symptoms do not improve.

## 2023-05-06 NOTE — Progress Notes (Signed)
Throat swab

## 2023-05-08 ENCOUNTER — Encounter: Payer: Self-pay | Admitting: Family Medicine

## 2023-05-08 LAB — CULTURE, GROUP A STREP
Micro Number: 15750258
SPECIMEN QUALITY:: ADEQUATE

## 2023-05-14 ENCOUNTER — Ambulatory Visit: Payer: 59 | Attending: Obstetrics and Gynecology

## 2023-05-14 ENCOUNTER — Ambulatory Visit (INDEPENDENT_AMBULATORY_CARE_PROVIDER_SITE_OTHER): Payer: 59 | Admitting: Family Medicine

## 2023-05-14 ENCOUNTER — Other Ambulatory Visit (HOSPITAL_BASED_OUTPATIENT_CLINIC_OR_DEPARTMENT_OTHER): Payer: Self-pay

## 2023-05-14 VITALS — BP 134/78 | HR 99 | Wt 178.0 lb

## 2023-05-14 DIAGNOSIS — O99332 Smoking (tobacco) complicating pregnancy, second trimester: Secondary | ICD-10-CM | POA: Diagnosis not present

## 2023-05-14 DIAGNOSIS — O3662X0 Maternal care for excessive fetal growth, second trimester, not applicable or unspecified: Secondary | ICD-10-CM | POA: Diagnosis not present

## 2023-05-14 DIAGNOSIS — Z3A24 24 weeks gestation of pregnancy: Secondary | ICD-10-CM | POA: Diagnosis not present

## 2023-05-14 DIAGNOSIS — F17210 Nicotine dependence, cigarettes, uncomplicated: Secondary | ICD-10-CM

## 2023-05-14 DIAGNOSIS — Z362 Encounter for other antenatal screening follow-up: Secondary | ICD-10-CM | POA: Insufficient documentation

## 2023-05-14 DIAGNOSIS — Z3400 Encounter for supervision of normal first pregnancy, unspecified trimester: Secondary | ICD-10-CM

## 2023-05-14 DIAGNOSIS — F411 Generalized anxiety disorder: Secondary | ICD-10-CM

## 2023-05-14 MED ORDER — CITALOPRAM HYDROBROMIDE 20 MG PO TABS
20.0000 mg | ORAL_TABLET | Freq: Every day | ORAL | 3 refills | Status: DC
  Filled 2023-05-14: qty 90, 90d supply, fill #0

## 2023-05-14 NOTE — Progress Notes (Signed)
   PRENATAL VISIT NOTE  Subjective:  Barbara Lutz is a 27 y.o. G1P0000 at [redacted]w[redacted]d being seen today for ongoing prenatal care.  She is currently monitored for the following issues for this low-risk pregnancy and has History of pyelonephritis; Nicotine dependence, cigarettes, uncomplicated; Obesity (BMI 30-39.9)-pre-G BMI 29.81; GAD (generalized anxiety disorder); Abnormal urine cytology; Dysmenorrhea; Supervision of normal first pregnancy, antepartum; Gastroenteritis; LGA (large for gestational age) fetus affecting management of mother; and Sore throat on their problem list.  Patient reports  increased anxiety/depression. Was on celexa 20mg  before and did well on that. Would like to go back on it .  Contractions: Not present. Vag. Bleeding: None.  Movement: Present. Denies leaking of fluid.   The following portions of the patient's history were reviewed and updated as appropriate: allergies, current medications, past family history, past medical history, past social history, past surgical history and problem list.   Objective:   Vitals:   05/14/23 1022  BP: 134/78  Pulse: 99  Weight: 178 lb (80.7 kg)    Fetal Status: Fetal Heart Rate (bpm): 134   Movement: Present     General:  Alert, oriented and cooperative. Patient is in no acute distress.  Skin: Skin is warm and dry. No rash noted.   Cardiovascular: Normal heart rate noted  Respiratory: Normal respiratory effort, no problems with respiration noted  Abdomen: Soft, gravid, appropriate for gestational age.  Pain/Pressure: Absent     Pelvic: Cervical exam deferred        Extremities: Normal range of motion.  Edema: None  Mental Status: Normal mood and affect. Normal behavior. Normal judgment and thought content.   Assessment and Plan:  Pregnancy: G1P0000 at [redacted]w[redacted]d 1. [redacted] weeks gestation of pregnancy  2. Supervision of normal first pregnancy, antepartum FHT and FH normal  3. GAD (generalized anxiety disorder) Studies show improved  psychosocial development in babies when their depression is controlled. Patient okay with   4. Excessive fetal growth affecting management of pregnancy in second trimester, single or unspecified fetus Has growth scan later today   Preterm labor symptoms and general obstetric precautions including but not limited to vaginal bleeding, contractions, leaking of fluid and fetal movement were reviewed in detail with the patient. Please refer to After Visit Summary for other counseling recommendations.   No follow-ups on file.  Future Appointments  Date Time Provider Department Center  05/14/2023 12:30 PM [facility] US5 [facility]  05/19/2023  5:00 PM Bauert, Rella Larve, LCSW [facility] None  06/03/2023  8:35 AM Gerrit Heck, CNM [facility] None  06/19/2023  5:00 PM Bauert, Rella Larve, LCSW [facility] None  07/03/2023  8:15 AM Levie Heritage, DO [facility] None  07/14/2023  8:15 AM Lorriane Shire, MD [facility] None    Levie Heritage, DO

## 2023-05-19 ENCOUNTER — Ambulatory Visit (INDEPENDENT_AMBULATORY_CARE_PROVIDER_SITE_OTHER): Payer: 59 | Admitting: Psychology

## 2023-05-19 DIAGNOSIS — F4323 Adjustment disorder with mixed anxiety and depressed mood: Secondary | ICD-10-CM | POA: Diagnosis not present

## 2023-05-19 NOTE — Progress Notes (Signed)
[facility] Counselor/Therapist Progress Note  Patient ID: Jaret Bonanni, MRN: [id number],    Date: 05/19/2023  Time Spent: 5:00pm-5:55pm   55 minutes   Treatment Type: Individual Therapy  Reported Symptoms: stress  Mental Status Exam: Appearance:  Casual     Behavior: Appropriate  Motor: Normal  Speech/Language:  Normal Rate  Affect: Appropriate  Mood: normal  Thought process: normal  Thought content:   WNL  Sensory/Perceptual disturbances:   WNL  Orientation: oriented to person, place, time/date, and situation  Attention: Good  Concentration: Good  Memory: WNL  Fund of knowledge:  Good  Insight:   Good  Judgment:  Good  Impulse Control: Good   Risk Assessment: Danger to Self:  No Self-injurious Behavior: No Danger to Others: No Duty to Warn:no Physical Aggression / Violence:No  Access to Firearms a concern: No  Gang Involvement:No   Subjective: Pt present for face-to-face individual therapy via video.  Pt consents to telehealth video session and is aware of limitations and benefits of virtual session.  Location of pt: home Location of therapist: home office.   Pt talked about work.   She states work has been stressful.  Addressed the work issues and how they impacted pt.  Worked on Optician, dispensing.  Pt talked about her pregnancy.   Pt had the anatomy scan and the baby is healthy.  He is a baby boy and pt has named him Building control surveyor. Pt has talked with her baby's father Vonna Kotyk. It was not a very pleasant call and he yelled at pt.  The may need to go to court once the baby is born in March.    Pt is upset about this.  Helped pt process her feelings and relationship dynamics.  Pt is worried about how she and Vonna Kotyk will co-parent their baby.   Pt has been put back on Celexa for anxiety and depression.   Pt states it has been a difficult time for her and she feels she needed to get back on the medication.   Pt is trying to rely on her family and friends who are  supportive of her.  They are giving pt a baby shower on Jan. 11th.   Worked on self care strategies. Provided supportive therapy.   Interventions: Cognitive Behavioral Therapy and Insight-Oriented  Diagnosis: F43.23  Plan of Care: Recommend ongoing therapy.   Pt participated in setting treatment goals.   Pt wants to improve coping skills and cope without using weed or getting angry.   Pt wants to manage stress better.    Plan to meet monthly.   Pt agrees with treatment plan.    Treatment Plan (Treatment Plan Target Date:  02/20/2024) Client Abilities/Strengths  Pt is bright, engaging, and motivated for therapy.  Client Treatment Preferences  Individual therapy.  Client Statement of Needs  Improve copings skills and understand herself better. Improve self esteem.  Symptoms  Depressed or irritable mood. Excessive and/or unrealistic worry that is difficult to control occurring more days than not for at least 6 months about a number of events or activities. Hypervigilance (e.g., feeling constantly on edge, experiencing concentration difficulties, having trouble falling or staying asleep, exhibiting a general state of irritability). Low self-esteem. Problems Addressed  Unipolar Depression, Anxiety Goals 1. Alleviate depressive symptoms and return to previous level of effective functioning. 2. Appropriately grieve the loss in order to normalize mood and to return to previously adaptive level of functioning. Objective Learn and implement behavioral strategies to overcome depression. Target Date:  2024-02-20 Frequency: Monthly  Progress: 30 Modality: individual  Related Interventions Assist the client in developing skills that increase the likelihood of deriving pleasure from behavioral activation (e.g., assertiveness skills, developing an exercise plan, less internal/more external focus, increased social involvement); reinforce success. Engage the client in "behavioral activation," increasing  his/her activity level and contact with sources of reward, while identifying processes that inhibit activation. use behavioral techniques such as instruction, rehearsal, role-playing, role reversal, as needed, to facilitate activity in the client's daily life; reinforce success. 3. Develop healthy interpersonal relationships that lead to the alleviation and help prevent the relapse of depression. 4. Develop healthy thinking patterns and beliefs about self, others, and the world that lead to the alleviation and help prevent the relapse of depression. 5. Enhance ability to effectively cope with the full variety of life's worries and anxieties. 6. Learn and implement coping skills that result in a reduction of anxiety and worry, and improved daily functioning. Objective Learn and implement problem-solving strategies for realistically addressing worries. Target Date: 2024-02-20 Frequency: Monthly  Progress: 30 Modality: individual  Related Interventions Assign the client a homework exercise in which he/she problem-solves a current problem (see Mastery of Your Anxiety and Worry: Workbook by Elenora Fender and Filbert Schilder or Generalized Anxiety Disorder by Elesa Hacker, and Filbert Schilder); review, reinforce success, and provide corrective feedback toward improvement. Teach the client problem-solving strategies involving specifically defining a problem, generating options for addressing it, evaluating the pros and cons of each option, selecting and implementing an optional action, and reevaluating and refining the action. Objective Learn and implement calming skills to reduce overall anxiety and manage anxiety symptoms. Target Date: 2024-02-20 Frequency: Monthly  Progress: 30 Modality: individual  Related Interventions Assign the client to read about progressive muscle relaxation and other calming strategies in relevant books or treatment manuals (e.g., Progressive Relaxation Training by Twana First; Mastery of  Your Anxiety and Worry: Workbook by Earlie Counts). Assign the client homework each session in which he/she practices relaxation exercises daily, gradually applying them progressively from non-anxiety-provoking to anxiety-provoking situations; review and reinforce success while providing corrective feedback toward improvement. Teach the client calming/relaxation skills (e.g., applied relaxation, progressive muscle relaxation, cue controlled relaxation; mindful breathing; biofeedback) and how to discriminate better between relaxation and tension; teach the client how to apply these skills to his/her daily life. 7. Recognize, accept, and cope with feelings of depression. 8. Reduce overall frequency, intensity, and duration of the anxiety so that daily functioning is not impaired. 9. Resolve the core conflict that is the source of anxiety. 10. Stabilize anxiety level while increasing ability to function on a daily basis. Diagnosis F43.23 Conditions For Discharge Achievement of treatment goals and objectives   Salomon Fick, LCSW

## 2023-06-03 ENCOUNTER — Ambulatory Visit (INDEPENDENT_AMBULATORY_CARE_PROVIDER_SITE_OTHER): Payer: 59

## 2023-06-03 ENCOUNTER — Other Ambulatory Visit (HOSPITAL_BASED_OUTPATIENT_CLINIC_OR_DEPARTMENT_OTHER): Payer: Self-pay

## 2023-06-03 ENCOUNTER — Other Ambulatory Visit: Payer: Self-pay

## 2023-06-03 VITALS — BP 121/69 | HR 86 | Wt 183.0 lb

## 2023-06-03 DIAGNOSIS — O99342 Other mental disorders complicating pregnancy, second trimester: Secondary | ICD-10-CM

## 2023-06-03 DIAGNOSIS — O2242 Hemorrhoids in pregnancy, second trimester: Secondary | ICD-10-CM

## 2023-06-03 DIAGNOSIS — Z3A27 27 weeks gestation of pregnancy: Secondary | ICD-10-CM | POA: Diagnosis not present

## 2023-06-03 DIAGNOSIS — O26892 Other specified pregnancy related conditions, second trimester: Secondary | ICD-10-CM | POA: Diagnosis not present

## 2023-06-03 DIAGNOSIS — Z3490 Encounter for supervision of normal pregnancy, unspecified, unspecified trimester: Secondary | ICD-10-CM

## 2023-06-03 DIAGNOSIS — N898 Other specified noninflammatory disorders of vagina: Secondary | ICD-10-CM

## 2023-06-03 DIAGNOSIS — Z3492 Encounter for supervision of normal pregnancy, unspecified, second trimester: Secondary | ICD-10-CM | POA: Diagnosis not present

## 2023-06-03 DIAGNOSIS — Z3400 Encounter for supervision of normal first pregnancy, unspecified trimester: Secondary | ICD-10-CM

## 2023-06-03 DIAGNOSIS — Z1339 Encounter for screening examination for other mental health and behavioral disorders: Secondary | ICD-10-CM

## 2023-06-03 DIAGNOSIS — F411 Generalized anxiety disorder: Secondary | ICD-10-CM

## 2023-06-03 MED ORDER — LIDOCAINE-MENTHOL (SPRAY) 4-1 % EX LIQD
1.0000 | Freq: Two times a day (BID) | CUTANEOUS | 0 refills | Status: DC | PRN
  Filled 2023-06-03: qty 118, fill #0

## 2023-06-03 MED ORDER — HYDROCORTISONE (PERIANAL) 2.5 % EX CREA
1.0000 | TOPICAL_CREAM | Freq: Two times a day (BID) | CUTANEOUS | 0 refills | Status: DC
  Filled 2023-06-03: qty 30, 10d supply, fill #0

## 2023-06-03 MED ORDER — TERCONAZOLE 0.4 % VA CREA
1.0000 | TOPICAL_CREAM | Freq: Every day | VAGINAL | 0 refills | Status: DC
  Filled 2023-06-03: qty 45, 30d supply, fill #0

## 2023-06-03 NOTE — Patient Instructions (Addendum)
  Considering Waterbirth? Guide for patients at Center for [facility]) Why consider waterbirth? Gentle birth for babies  Less pain medicine used in labor  May allow for passive descent/less pushing  May reduce perineal tears  More mobility and instinctive maternal position changes  Increased maternal relaxation   Is waterbirth safe? What are the risks of infection, drowning or other complications? Infection:  Very low risk (3.7 % for tub vs 4.8% for bed)  7 in 8000 waterbirths with documented infection  Poorly cleaned equipment most common cause  Slightly lower group B strep transmission rate  Drowning  Maternal:  Very low risk  Related to seizures or fainting  Newborn:  Very low risk. No evidence of increased risk of respiratory problems in multiple large studies  Physiological protection from breathing under water  Avoid underwater birth if there are any fetal complications  Once baby's head is out of the water, keep it out.  Birth complication  Some reports of cord trauma, but risk decreased by bringing baby to surface gradually  No evidence of increased risk of shoulder dystocia. Mothers can usually change positions faster in water than in a bed, possibly aiding the maneuvers to free the shoulder.   There are 2 things you MUST do to have a waterbirth with [facility]: Attend a waterbirth class at [facility]   3rd Wednesday of every month from 7-9 pm (virtual during COVID) [employer] at [email or web address] or [email or web address] or by calling [phone] Bring [place] the certificate from the class to your prenatal appointment or send via MyChart Meet with a midwife at 36 weeks* to see if you can still plan a waterbirth.   *We also recommend that you schedule as many of your prenatal visits with a midwife as possible.    Helpful information: You may want to bring a bathing suit top to the hospital to wear during labor but  this is optional.  All other supplies are provided by the hospital. Please arrive at the hospital with signs of active labor, and do not wait at home until late in labor. It takes 45 min- 1 hour for fetal monitoring, and check in to your room to take place, plus transport and filling of the waterbirth tub.    Things that would prevent you from having a waterbirth: Premature, <37wks  Previous cesarean birth  Presence of thick meconium-stained fluid  Multiple gestation (Twins, triplets, etc.)  Uncontrolled diabetes or gestational diabetes requiring medication  Hypertension diagnosed in pregnancy or preexisting hypertension (gestational hypertension, preeclampsia, or chronic hypertension) Fetal growth restriction (your baby measures less than 10th percentile on ultrasound) Heavy vaginal bleeding  Non-reassuring fetal heart rate  Active infection (MRSA, etc.). Group B Strep is NOT a contraindication for waterbirth.  If your labor has to be induced and induction method requires continuous monitoring of the baby's heart rate  Other risks/issues identified by your obstetrical provider   Please remember that birth is unpredictable. Under certain unforeseeable circumstances your provider may advise against giving birth in the tub. These decisions will be made on a case-by-case basis and with the safety of you and your baby as our highest priority.    Updated 09/19/21

## 2023-06-03 NOTE — Progress Notes (Signed)
LOW-RISK PREGNANCY OFFICE VISIT  Patient name: Barbara Lutz MRN [id number]  Date of birth: April 10, 1996 Chief Complaint:   Routine Prenatal Visit  Subjective:   Barbara Lutz is a 27 y.o. G30P0000 female at [redacted]w[redacted]d with an Estimated Date of Delivery: 08/31/23 being seen today for ongoing management of a low-risk pregnancy aeb has History of pyelonephritis; Nicotine dependence, cigarettes, uncomplicated; Obesity (BMI 30-39.9)-pre-G BMI 29.81; GAD (generalized anxiety disorder); Abnormal urine cytology; Dysmenorrhea; Supervision of normal first pregnancy, antepartum; Gastroenteritis; LGA (large for gestational age) fetus affecting management of mother; and Sore throat on their problem list.  Patient presents today, alone, in a wonderful, bright mood!  She reports improved mood with initiation of Celexa. She states she feels the medication has "calmed my mind."  She also reports improved sleep, which was an issue initially.  Patient endorses fetal movement. Patient reports some abdominal cramping that she feels are BH contractions.  Patient reports ongoing vaginal discharge that she feels is "yeasty" and reports she had similar issue prior to pregnancy. She states she treated one month ago with OTC medication. She denies other vaginal concerns including leaking of fluid and bleeding. No issues with urination, constipation, or diarrhea. However, she reports hemorrhoids that she states are not painful, but would like to treat.     Contractions: Irritability. Vag. Bleeding: None.  Movement: Present.  Reviewed past medical,surgical, social, obstetrical and family history as well as problem list, medications and allergies.  Objective   Vitals:   06/03/23 0843  BP: 121/69  Pulse: 86  Weight: 183 lb (83 kg)  Body mass index is 33.47 kg/m.  Total Weight Gain:20 lb (9.072 kg)         Physical Examination:   General appearance: Well appearing, and in no distress  Mental status: Alert, oriented to  person, place, and time  Skin: Warm & dry  Cardiovascular: Normal heart rate noted  Respiratory: Normal respiratory effort, no distress  Abdomen: Soft, gravid, nontender, LGA with Fundal Height: 31 cm  Pelvic: Cervical exam deferred           Extremities: Edema: None  Fetal Status:    Movement: Present   No results found for this or any previous visit (from the past 24 hours).  Assessment & Plan:  High-risk pregnancy of a 27 y.o., G1P0000 at [redacted]w[redacted]d with an Estimated Date of Delivery: 08/31/23   1. Supervision of normal first pregnancy, antepartum -Anticipatory guidance for upcoming appts. -Patient to schedule next appt in 2-3 weeks for an in-person visit. -Reviewed problem list notable for smoking.  Patient reports she has not smoked cigarettes x 3 years, but does vape occasionally.   2. [redacted] weeks gestation of pregnancy -Doing well. -Reviewed complaints. -Glucola appt completed today.  -Reviewed blood draw procedures and labs which also include check of iron/HgB level, RPR, and HIV *Informed that repeat RPR/HIV are for pediatric records/compliance.  -Discussed how results of GTT are handled including diabetic education and BS testing for abnormal results and routine care for normal results.   3. GAD (generalized anxiety disorder) -GAD8, PHQ9 6 -Continue Celexa as ordered  4. Fundal height high for dates -FH 57 today.  -Order placed for growth [place] in 3-4 weeks.  5. Vaginal discharge during pregnancy in second trimester -Discussed treatment with Terazol cream. Patient agreeable. -Will send 7 day and patient to use as needed for yeast.   6. Hemorrhoids during pregnancy in second trimester -Discussed Preparation H type cream for hemorrhoids as well as spray. -Cautioned  that some pharmacies may not have lidocaine spray.     Meds: No orders of the defined types were placed in this encounter.  Labs/procedures today:  Lab Orders         CBC         Glucose Tolerance, 2 Hours w/1  Hour         HIV Antibody (routine testing w rflx)         RPR      Reviewed: Preterm labor symptoms and general obstetric precautions including but not limited to vaginal bleeding, contractions, leaking of fluid and fetal movement were reviewed in detail with the patient.  All questions were answered.  Follow-up: No follow-ups on file.  Orders Placed This Encounter  Procedures   CBC   Glucose Tolerance, 2 Hours w/1 Hour   HIV Antibody (routine testing w rflx)   RPR   Cherre Robins MSN, CNM 06/03/2023

## 2023-06-04 LAB — CBC
Hematocrit: 34.6 % (ref 34.0–46.6)
Hemoglobin: 11.5 g/dL (ref 11.1–15.9)
MCH: 32 pg (ref 26.6–33.0)
MCHC: 33.2 g/dL (ref 31.5–35.7)
MCV: 96 fL (ref 79–97)
Platelets: 198 10*3/uL (ref 150–450)
RBC: 3.59 x10E6/uL — ABNORMAL LOW (ref 3.77–5.28)
RDW: 12.1 % (ref 11.7–15.4)
WBC: 9.9 10*3/uL (ref 3.4–10.8)

## 2023-06-04 LAB — HIV ANTIBODY (ROUTINE TESTING W REFLEX): HIV Screen 4th Generation wRfx: NONREACTIVE

## 2023-06-04 LAB — GLUCOSE TOLERANCE, 2 HOURS W/ 1HR
Glucose, 1 hour: 135 mg/dL (ref 70–179)
Glucose, 2 hour: 124 mg/dL (ref 70–152)
Glucose, Fasting: 78 mg/dL (ref 70–91)

## 2023-06-04 LAB — RPR: RPR Ser Ql: NONREACTIVE

## 2023-06-19 ENCOUNTER — Ambulatory Visit: Payer: Commercial Managed Care - PPO | Admitting: Psychology

## 2023-06-19 DIAGNOSIS — F4323 Adjustment disorder with mixed anxiety and depressed mood: Secondary | ICD-10-CM | POA: Diagnosis not present

## 2023-06-19 NOTE — Progress Notes (Signed)
 [facility] Counselor/Therapist Progress Note  Patient ID: Barbara Lutz, MRN: [id number],    Date: 06/19/2023  Time Spent: 5:00pm-5:50pm   50 minutes   Treatment Type: Individual Therapy  Reported Symptoms: stress  Mental Status Exam: Appearance:  Casual     Behavior: Appropriate  Motor: Normal  Speech/Language:  Normal Rate  Affect: Appropriate  Mood: normal  Thought process: normal  Thought content:   WNL  Sensory/Perceptual disturbances:   WNL  Orientation: oriented to person, place, time/date, and situation  Attention: Good  Concentration: Good  Memory: WNL  Fund of knowledge:  Good  Insight:   Good  Judgment:  Good  Impulse Control: Good   Risk Assessment: Danger to Self:  No Self-injurious Behavior: No Danger to Others: No Duty to Warn:no Physical Aggression / Violence:No  Access to Firearms a concern: No  Gang Involvement:No   Subjective: Pt present for face-to-face individual therapy via video.  Pt consents to telehealth video session and is aware of limitations and benefits of virtual session.  Location of pt: home Location of therapist: home office.   Pt talked about work.  Pt's colleagues have been very supportive of her regarding her pregnancy.   Pt talked about her pregnancy.   Pt is [redacted] weeks pregnant and is excited about the birth.   She is making healthy decisions to take care of herself.   Pt has not had much contact with the baby's father.   She is keeping boundaries to decrease stress.    Pt has been put back on Celexa  for anxiety and depression.   Pt states the medication is really helping her.  Pt is working on keeping a positive mindset as well.  Pt is trying to rely on her family and friends who are supportive of her.  They are giving pt a baby shower on Jan. 11th.   Worked on self care strategies. Provided supportive therapy.   Interventions: Cognitive Behavioral Therapy and Insight-Oriented  Diagnosis: F43.23  Plan of Care:  Recommend ongoing therapy.   Pt participated in setting treatment goals.   Pt wants to improve coping skills and cope without using weed or getting angry.   Pt wants to manage stress better.    Plan to meet monthly.   Pt agrees with treatment plan.    Treatment Plan (Treatment Plan Target Date:  02/20/2024) Client Abilities/Strengths  Pt is bright, engaging, and motivated for therapy.  Client Treatment Preferences  Individual therapy.  Client Statement of Needs  Improve copings skills and understand herself better. Improve self esteem.  Symptoms  Depressed or irritable mood. Excessive and/or unrealistic worry that is difficult to control occurring more days than not for at least 6 months about a number of events or activities. Hypervigilance (e.g., feeling constantly on edge, experiencing concentration difficulties, having trouble falling or staying asleep, exhibiting a general state of irritability). Low self-esteem. Problems Addressed  Unipolar Depression, Anxiety Goals 1. Alleviate depressive symptoms and return to previous level of effective functioning. 2. Appropriately grieve the loss in order to normalize mood and to return to previously adaptive level of functioning. Objective Learn and implement behavioral strategies to overcome depression. Target Date: 2024-02-20 Frequency: Monthly  Progress: 30 Modality: individual  Related Interventions Assist the client in developing skills that increase the likelihood of deriving pleasure from behavioral activation (e.g., assertiveness skills, developing an exercise plan, less internal/more external focus, increased social involvement); reinforce success. Engage the client in behavioral activation, increasing his/her activity level and contact  with sources of reward, while identifying processes that inhibit activation. use behavioral techniques such as instruction, rehearsal, role-playing, role reversal, as needed, to facilitate activity in the  client's daily life; reinforce success. 3. Develop healthy interpersonal relationships that lead to the alleviation and help prevent the relapse of depression. 4. Develop healthy thinking patterns and beliefs about self, others, and the world that lead to the alleviation and help prevent the relapse of depression. 5. Enhance ability to effectively cope with the full variety of life's worries and anxieties. 6. Learn and implement coping skills that result in a reduction of anxiety and worry, and improved daily functioning. Objective Learn and implement problem-solving strategies for realistically addressing worries. Target Date: 2024-02-20 Frequency: Monthly  Progress: 30 Modality: individual  Related Interventions Assign the client a homework exercise in which he/she problem-solves a current problem (see Mastery of Your Anxiety and Worry: Workbook by Richarda and Jonne or Generalized Anxiety Disorder by Delores Filler, and Jonne); review, reinforce success, and provide corrective feedback toward improvement. Teach the client problem-solving strategies involving specifically defining a problem, generating options for addressing it, evaluating the pros and cons of each option, selecting and implementing an optional action, and reevaluating and refining the action. Objective Learn and implement calming skills to reduce overall anxiety and manage anxiety symptoms. Target Date: 2024-02-20 Frequency: Monthly  Progress: 30 Modality: individual  Related Interventions Assign the client to read about progressive muscle relaxation and other calming strategies in relevant books or treatment manuals (e.g., Progressive Relaxation Training by Thornell armin Collier; Mastery of Your Anxiety and Worry: Workbook by Richarda armin Jonne). Assign the client homework each session in which he/she practices relaxation exercises daily, gradually applying them progressively from non-anxiety-provoking to anxiety-provoking  situations; review and reinforce success while providing corrective feedback toward improvement. Teach the client calming/relaxation skills (e.g., applied relaxation, progressive muscle relaxation, cue controlled relaxation; mindful breathing; biofeedback) and how to discriminate better between relaxation and tension; teach the client how to apply these skills to his/her daily life. 7. Recognize, accept, and cope with feelings of depression. 8. Reduce overall frequency, intensity, and duration of the anxiety so that daily functioning is not impaired. 9. Resolve the core conflict that is the source of anxiety. 10. Stabilize anxiety level while increasing ability to function on a daily basis. Diagnosis F43.23 Conditions For Discharge Achievement of treatment goals and objectives   Veva Alma, LCSW

## 2023-07-03 ENCOUNTER — Ambulatory Visit: Payer: Commercial Managed Care - PPO | Attending: Maternal & Fetal Medicine | Admitting: Maternal & Fetal Medicine

## 2023-07-03 ENCOUNTER — Ambulatory Visit: Payer: Commercial Managed Care - PPO | Admitting: Family Medicine

## 2023-07-03 ENCOUNTER — Ambulatory Visit: Payer: Commercial Managed Care - PPO | Attending: Obstetrics and Gynecology

## 2023-07-03 ENCOUNTER — Other Ambulatory Visit: Payer: Self-pay | Admitting: *Deleted

## 2023-07-03 VITALS — BP 117/67 | HR 90 | Wt 199.0 lb

## 2023-07-03 DIAGNOSIS — Z3A31 31 weeks gestation of pregnancy: Secondary | ICD-10-CM | POA: Insufficient documentation

## 2023-07-03 DIAGNOSIS — O99343 Other mental disorders complicating pregnancy, third trimester: Secondary | ICD-10-CM

## 2023-07-03 DIAGNOSIS — Z23 Encounter for immunization: Secondary | ICD-10-CM

## 2023-07-03 DIAGNOSIS — Z3490 Encounter for supervision of normal pregnancy, unspecified, unspecified trimester: Secondary | ICD-10-CM | POA: Diagnosis present

## 2023-07-03 DIAGNOSIS — O403XX0 Polyhydramnios, third trimester, not applicable or unspecified: Secondary | ICD-10-CM | POA: Diagnosis not present

## 2023-07-03 DIAGNOSIS — F411 Generalized anxiety disorder: Secondary | ICD-10-CM

## 2023-07-03 DIAGNOSIS — O409XX0 Polyhydramnios, unspecified trimester, not applicable or unspecified: Secondary | ICD-10-CM | POA: Diagnosis not present

## 2023-07-03 DIAGNOSIS — O99213 Obesity complicating pregnancy, third trimester: Secondary | ICD-10-CM | POA: Diagnosis not present

## 2023-07-03 DIAGNOSIS — O0993 Supervision of high risk pregnancy, unspecified, third trimester: Secondary | ICD-10-CM | POA: Diagnosis not present

## 2023-07-03 DIAGNOSIS — Z3400 Encounter for supervision of normal first pregnancy, unspecified trimester: Secondary | ICD-10-CM

## 2023-07-03 DIAGNOSIS — O3663X0 Maternal care for excessive fetal growth, third trimester, not applicable or unspecified: Secondary | ICD-10-CM | POA: Diagnosis not present

## 2023-07-03 NOTE — Progress Notes (Signed)
   PRENATAL VISIT NOTE  Subjective:  Barbara Lutz is a 28 y.o. G1P0000 at [redacted]w[redacted]d being seen today for ongoing prenatal care.  She is currently monitored for the following issues for this low-risk pregnancy and has History of pyelonephritis; Nicotine dependence, cigarettes, uncomplicated; Obesity (BMI 30-39.9)-pre-G BMI 29.81; GAD (generalized anxiety disorder); Abnormal urine cytology; Dysmenorrhea; Supervision of normal first pregnancy, antepartum; Gastroenteritis; LGA (large for gestational age) fetus affecting management of mother; and Sore throat on their problem list.  Patient reports  anxiety improved on celexa. Mild back and rib pain .  Contractions: Not present. Vag. Bleeding: None.  Movement: Present. Denies leaking of fluid.   The following portions of the patient's history were reviewed and updated as appropriate: allergies, current medications, past family history, past medical history, past social history, past surgical history and problem list.   Objective:   Vitals:   07/03/23 0810  BP: 117/67  Pulse: 90  Weight: 199 lb (90.3 kg)    Fetal Status: Fetal Heart Rate (bpm): 125   Movement: Present     General:  Alert, oriented and cooperative. Patient is in no acute distress.  Skin: Skin is warm and dry. No rash noted.   Cardiovascular: Normal heart rate noted  Respiratory: Normal respiratory effort, no problems with respiration noted  Abdomen: Soft, gravid, appropriate for gestational age.  Pain/Pressure: Present     Pelvic: Cervical exam deferred        Extremities: Normal range of motion.  Edema: None  Mental Status: Normal mood and affect. Normal behavior. Normal judgment and thought content.   Assessment and Plan:  Pregnancy: G1P0000 at [redacted]w[redacted]d 1. [redacted] weeks gestation of pregnancy (Primary) - Tdap vaccine greater than or equal to 7yo IM  2. Supervision of normal first pregnancy, antepartum FHT normal  3. GAD (generalized anxiety disorder) Continue celexa. Will  consider increasing just prior to delivery  4. Need for Tdap vaccination - Tdap vaccine greater than or equal to 7yo IM  5. Excessive fetal growth affecting management of pregnancy in third trimester, single or unspecified fetus Has [place] today  Preterm labor symptoms and general obstetric precautions including but not limited to vaginal bleeding, contractions, leaking of fluid and fetal movement were reviewed in detail with the patient. Please refer to After Visit Summary for other counseling recommendations.   No follow-ups on file.  Future Appointments  Date Time Provider Department Center  07/03/2023 11:30 AM [facility] US2 [facility]  07/14/2023  8:15 AM Lorriane Shire, MD [facility] None  07/29/2023  8:15 AM Gerrit Heck, CNM [facility] None  08/05/2023  5:00 PM Bauert, Rella Larve, LCSW [facility] None  08/12/2023  1:10 PM Gerrit Heck, CNM [facility] None  09/08/2023 11:00 AM Bauert, Rella Larve, LCSW [facility] None    Levie Heritage, DO

## 2023-07-03 NOTE — Progress Notes (Addendum)
   Patient information  Patient Name: Barbara Lutz  Patient MRN:   [id number]  Referring practice: [facility]FM Referring Provider: [facility] ([facility])  MFM CONSULT  RUBAB LAUDANO is a 28 y.o. G1P0000 at [redacted]w[redacted]d here for ultrasound and consultation. Patient Active Problem List   Diagnosis Date Noted   Polyhydramnios affecting pregnancy 07/03/2023   Supervision of high risk pregnancy, antepartum, third trimester 01/29/2023   Abnormal urine cytology 02/12/2022   GAD (generalized anxiety disorder) 03/19/2021   Obesity (BMI 30-39.9)-pre-G BMI 29.81 07/23/2018   RE polyhydramnios: I discussed the findings of moderate polyhydramnios with the patient.  The most likely cause is idiopathic followed by maternal diabetes and then much less commonly fetal anomalies such as a tracheoesophageal fistula or placental abnormalities such as a chorioangioma.  There is no evidence of fetal or placental structural abnormalities.  I discussed that with moderate polyhydramnios antenatal testing can be considered as early as 32 weeks.  We also discussed the need to repeat the ultrasound to confirm the diagnosis in the future.  Patient reports she passed her glucose testing but would like to check blood sugars for 1 week just to ensure there is not a false negative test.  She will discuss this with her OB provider.  I discussed the parameters for blood sugars in pregnancy.  Depending on her clinical course will recommend delivery likely around 39 to 40 weeks.  Sonographic findings Single intrauterine pregnancy at 31w 3d.  Fetal cardiac activity:  Observed and appears normal. Presentation: Cephalic. Interval fetal anatomy appears normal. Fetal biometry shows the estimated fetal weight at the 83 percentile. Amniotic fluid volume: Polyhydramnios. MVP: 9.38 cm. Placenta: Anterior.  Recommendations -EDD is Estimated Date of Delivery: 08/31/23. -Weekly antenatal testing as long as the fluid is above 30 cm.  Mild  polyhydramnios does not need antenatal testing. -For point blood sugar assessment to rule out false negative GTT -Growth ultrasounds every 4 to 6 weeks -Delivery around 39 to 40 weeks or sooner if clinically indicated  Review of Systems: A review of systems was performed and was negative except per HPI   Vitals and Physical Exam    07/03/2023    8:10 AM 06/03/2023    8:43 AM 05/14/2023   10:22 AM  Vitals with BMI  Weight 199 lbs 183 lbs 178 lbs  Systolic 117 121 409  Diastolic 67 69 78  Pulse 90 86 99  Sitting comfortably on the sonogram table Nonlabored breathing Normal rate and rhythm Abdomen is nontender  Past pregnancies OB History  Gravida Para Term Preterm AB Living  1 0 0 0 0 0  SAB IAB Ectopic Multiple Live Births  0 0 0 0 0    # Outcome Date GA Lbr Len/2nd Weight Sex Type Anes PTL Lv  1 Current             I spent 30 minutes reviewing the patients chart, including labs and images as well as counseling the patient about her medical conditions. Greater than 50% of the time was spent in direct face-to-face patient counseling.  Braxton Feathers  MFM, [place]   07/03/2023  1:41 PM

## 2023-07-04 ENCOUNTER — Encounter: Payer: Self-pay | Admitting: Family Medicine

## 2023-07-04 ENCOUNTER — Ambulatory Visit: Payer: Commercial Managed Care - PPO

## 2023-07-04 DIAGNOSIS — Z3493 Encounter for supervision of normal pregnancy, unspecified, third trimester: Secondary | ICD-10-CM

## 2023-07-04 DIAGNOSIS — Z3A31 31 weeks gestation of pregnancy: Secondary | ICD-10-CM

## 2023-07-04 DIAGNOSIS — Z3483 Encounter for supervision of other normal pregnancy, third trimester: Secondary | ICD-10-CM

## 2023-07-04 NOTE — Progress Notes (Signed)
Patient presents to the office for fetal heart tones after a fall this morning due to ice in patients yard. Patient endorses fetal movement at this time. Patient denies vaginal bleeding or leakage of fluids.  Fetal Heart Rate: 148 Preterm labor symptoms and general obstetric precautions reviewed in detail with patient and patient voices understanding.

## 2023-07-10 ENCOUNTER — Encounter: Payer: Self-pay | Admitting: Family Medicine

## 2023-07-11 ENCOUNTER — Other Ambulatory Visit (HOSPITAL_BASED_OUTPATIENT_CLINIC_OR_DEPARTMENT_OTHER): Payer: Self-pay

## 2023-07-11 ENCOUNTER — Ambulatory Visit: Payer: Commercial Managed Care - PPO | Attending: Obstetrics | Admitting: Obstetrics

## 2023-07-11 ENCOUNTER — Ambulatory Visit: Payer: Commercial Managed Care - PPO | Attending: Maternal & Fetal Medicine | Admitting: *Deleted

## 2023-07-11 ENCOUNTER — Other Ambulatory Visit: Payer: Self-pay | Admitting: *Deleted

## 2023-07-11 ENCOUNTER — Ambulatory Visit (HOSPITAL_BASED_OUTPATIENT_CLINIC_OR_DEPARTMENT_OTHER): Payer: Commercial Managed Care - PPO

## 2023-07-11 VITALS — BP 127/72 | HR 88

## 2023-07-11 DIAGNOSIS — Z3A32 32 weeks gestation of pregnancy: Secondary | ICD-10-CM

## 2023-07-11 DIAGNOSIS — O403XX0 Polyhydramnios, third trimester, not applicable or unspecified: Secondary | ICD-10-CM

## 2023-07-11 DIAGNOSIS — O0993 Supervision of high risk pregnancy, unspecified, third trimester: Secondary | ICD-10-CM

## 2023-07-11 DIAGNOSIS — O3663X0 Maternal care for excessive fetal growth, third trimester, not applicable or unspecified: Secondary | ICD-10-CM

## 2023-07-11 DIAGNOSIS — O409XX0 Polyhydramnios, unspecified trimester, not applicable or unspecified: Secondary | ICD-10-CM

## 2023-07-11 MED ORDER — CITALOPRAM HYDROBROMIDE 40 MG PO TABS
40.0000 mg | ORAL_TABLET | Freq: Every day | ORAL | 3 refills | Status: DC
  Filled 2023-07-11: qty 90, 90d supply, fill #0

## 2023-07-11 NOTE — Progress Notes (Signed)
MFM Consult Note  Barbara Lutz is currently at 32 weeks and 5 days.  She has been followed due to polyhydramnios and a large for gestational age fetus.    Although she screened negative for gestational diabetes, due to polyhydramnios noted last week, she has been monitoring her fingerstick values over the past week.  I reviewed her fingerstick logs and they were within normal limits.    A biophysical profile performed today was 8/8.  Polyhydramnios with a total AFI of 28.9 cm continues to be noted today.  The amniotic fluid level has decreased slightly as compared to last week.  As her fingerstick values have been within normal limits and as she has screened negative for gestational diabetes,  she she was advised that she does not have to perform daily fingersticks any longer.  She will return in 1 week for another BPP.    Due to polyhydramnios, she should continue to be followed with weekly NSTs in the [facility] office after her BPP next week.  The patient stated that all of her questions were answered today.  A total of 20 minutes was spent counseling and coordinating the care for this patient.  Greater than 50% of the time was spent in direct face-to-face contact.

## 2023-07-14 ENCOUNTER — Ambulatory Visit (INDEPENDENT_AMBULATORY_CARE_PROVIDER_SITE_OTHER): Payer: Commercial Managed Care - PPO | Admitting: Obstetrics and Gynecology

## 2023-07-14 VITALS — BP 126/75 | HR 81 | Wt 202.0 lb

## 2023-07-14 DIAGNOSIS — Z3400 Encounter for supervision of normal first pregnancy, unspecified trimester: Secondary | ICD-10-CM

## 2023-07-14 DIAGNOSIS — O409XX0 Polyhydramnios, unspecified trimester, not applicable or unspecified: Secondary | ICD-10-CM

## 2023-07-14 DIAGNOSIS — O99343 Other mental disorders complicating pregnancy, third trimester: Secondary | ICD-10-CM

## 2023-07-14 DIAGNOSIS — Z3A33 33 weeks gestation of pregnancy: Secondary | ICD-10-CM

## 2023-07-14 DIAGNOSIS — F411 Generalized anxiety disorder: Secondary | ICD-10-CM

## 2023-07-14 NOTE — Progress Notes (Signed)
   PRENATAL VISIT NOTE  Subjective:  Barbara Lutz is a 28 y.o. G1P0000 at [redacted]w[redacted]d being seen today for ongoing prenatal care.  She is currently monitored for the following issues for this low-risk pregnancy and has Obesity (BMI 30-39.9)-pre-G BMI 29.81; GAD (generalized anxiety disorder); Abnormal urine cytology; Supervision of high risk pregnancy, antepartum, third trimester; and Polyhydramnios affecting pregnancy on their problem list.  Patient reports no complaints.  Contractions: Irritability. Vag. Bleeding: None.  Movement: Present. Denies leaking of fluid.   The following portions of the patient's history were reviewed and updated as appropriate: allergies, current medications, past family history, past medical history, past social history, past surgical history and problem list.   Objective:   Vitals:   07/14/23 0808  BP: 126/75  Pulse: 81  Weight: 202 lb (91.6 kg)    Fetal Status: Fetal Heart Rate (bpm): 140   Movement: Present     General:  Alert, oriented and cooperative. Patient is in no acute distress.  Skin: Skin is warm and dry. No rash noted.   Cardiovascular: Normal heart rate noted  Respiratory: Normal respiratory effort, no problems with respiration noted  Abdomen: Soft, gravid, appropriate for gestational age.  Pain/Pressure: Present     Pelvic: Cervical exam deferred        Extremities: Normal range of motion.  Edema: None  Mental Status: Normal mood and affect. Normal behavior. Normal judgment and thought content.   Assessment and Plan:  Pregnancy: G1P0000 at [redacted]w[redacted]d 1. [redacted] weeks gestation of pregnancy (Primary) Doing well    2. Supervision of normal first pregnancy, antepartum FHR wnl   3. GAD (generalized anxiety disorder) Continue celexa  4. Polyhydramnios affecting pregnancy Per MFM, FW wnl, continue weekly NSTs Last AFI 28.9  Preterm labor symptoms and general obstetric precautions including but not limited to vaginal bleeding, contractions, leaking  of fluid and fetal movement were reviewed in detail with the patient. Please refer to After Visit Summary for other counseling recommendations.   No follow-ups on file.  Future Appointments  Date Time Provider Department Center  07/18/2023  3:15 PM [facility] NURSE [facility]  07/18/2023  3:30 PM [facility] US3 [facility]  07/29/2023  8:15 AM Gerrit Heck, CNM [facility] None  07/30/2023 11:30 AM [facility] US6 [facility]  08/05/2023  5:00 PM Bauert, Rella Larve, LCSW [facility] None  08/12/2023  1:10 PM Gerrit Heck, CNM [facility] None  08/21/2023  8:15 AM Levie Heritage, DO [facility] None  08/27/2023  8:15 AM Levie Heritage, DO [facility] None  09/08/2023 11:00 AM Bauert, Rella Larve, LCSW [facility] None    Lorriane Shire, MD

## 2023-07-18 ENCOUNTER — Other Ambulatory Visit: Payer: Self-pay | Admitting: *Deleted

## 2023-07-18 ENCOUNTER — Ambulatory Visit: Payer: Commercial Managed Care - PPO | Admitting: *Deleted

## 2023-07-18 ENCOUNTER — Ambulatory Visit: Payer: Commercial Managed Care - PPO | Attending: Maternal & Fetal Medicine

## 2023-07-18 VITALS — BP 136/65 | HR 75

## 2023-07-18 DIAGNOSIS — Z3A33 33 weeks gestation of pregnancy: Secondary | ICD-10-CM

## 2023-07-18 DIAGNOSIS — O403XX0 Polyhydramnios, third trimester, not applicable or unspecified: Secondary | ICD-10-CM | POA: Insufficient documentation

## 2023-07-18 DIAGNOSIS — O0993 Supervision of high risk pregnancy, unspecified, third trimester: Secondary | ICD-10-CM | POA: Insufficient documentation

## 2023-07-18 DIAGNOSIS — O3660X0 Maternal care for excessive fetal growth, unspecified trimester, not applicable or unspecified: Secondary | ICD-10-CM

## 2023-07-18 DIAGNOSIS — O409XX0 Polyhydramnios, unspecified trimester, not applicable or unspecified: Secondary | ICD-10-CM

## 2023-07-29 ENCOUNTER — Ambulatory Visit: Payer: Commercial Managed Care - PPO

## 2023-07-29 VITALS — BP 112/76 | HR 84 | Wt 204.0 lb

## 2023-07-29 DIAGNOSIS — O0993 Supervision of high risk pregnancy, unspecified, third trimester: Secondary | ICD-10-CM

## 2023-07-29 DIAGNOSIS — O409XX0 Polyhydramnios, unspecified trimester, not applicable or unspecified: Secondary | ICD-10-CM

## 2023-07-29 DIAGNOSIS — Z3A35 35 weeks gestation of pregnancy: Secondary | ICD-10-CM | POA: Diagnosis not present

## 2023-07-29 NOTE — Progress Notes (Addendum)
HIGH-RISK PREGNANCY OFFICE VISIT  Patient name: Barbara Lutz MRN [id number]  Date of birth: 10-11-1995 Chief Complaint:   Routine Prenatal Visit  Subjective:   Barbara Lutz is a 28 y.o. G16P0000 female at [redacted]w[redacted]d with an Estimated Date of Delivery: 08/31/23 being seen today for ongoing management of a high-risk pregnancy aeb has Obesity (BMI 30-39.9)-pre-G BMI 29.81; GAD (generalized anxiety disorder); Abnormal urine cytology; Supervision of high risk pregnancy, antepartum, third trimester; and Polyhydramnios affecting pregnancy on their problem list.  Patient presents today, alone, with no complaints.  She is in a pleasant mood. Patient endorses fetal movement. Patient reports abdominal cramping and some occasional contractions as well as lightening crotch.  Patient denies vaginal concerns including abnormal discharge, leaking of fluid, and bleeding, but reports increased discharge. No issues with urination or diarrhea. She does report constipation and hemorrhoids that have improved with increased Fiber and rectal cream with relief.   Contractions: Irregular.  .  Movement: Present.  Reviewed past medical,surgical, social, obstetrical and family history as well as problem list, medications and allergies.  Objective   Vitals:   07/29/23 1226  BP: 112/76  Pulse: 84  Weight: 204 lb (92.5 kg)  There is no height or weight on file to calculate BMI.  Total Weight Gain:39 lb (17.7 kg)         Physical Examination:   General appearance: Well appearing, and in no distress  Mental status: Alert, oriented to person, place, and time  Skin: Warm & dry  Cardiovascular: Normal heart rate noted  Respiratory: Normal respiratory effort, no distress  Abdomen: Soft, gravid, nontender, AGA with Fundal Height: 38 cm  Pelvic: Cervical exam deferred           Extremities:    Fetal Status: Fetal Heart Rate (bpm): 120  Movement: Present   NST   Fetal Heart Rate A   Mode External  Baseline Rate (A)  120 bpm  Variability 6-25 BPM  Accelerations 15 x 15  Decelerations None  Multiple birth? No  Interpretation (Fetal Testing)   Nonstress Test Interpretation Reactive  Overall Impression Reassuring for gestational age  Uterine Activity   Mode Toco  Contraction Frequency (min) IrregularContraction Frequency (min). Irregular. Has comment. Taken on 07/29/23 0858(Irritability)  Contraction Quality Mild  Resting Tone Palpated Relaxed  Resting Time Adequate   No results found for this or any previous visit (from the past 24 hours).  Assessment & Plan:  H-risk pregnancy of a 28 y.o., G1P0000 at [redacted]w[redacted]d with an Estimated Date of Delivery: 08/31/23   1. Supervision of high risk pregnancy, antepartum, third trimester -Anticipatory guidance for upcoming appts. -Patient to schedule next appt in 1 weeks for an in-person visit. -Reviewed next appt and cultures to be collected.  -Educated on GBS bacteria including what it is, why we test, and how and when we treat if needed. -Discussed collection method.  -Informed that GC/CT will also be collected.  -Discussed option of cervical exam, at visit, if desired.    2. [redacted] weeks gestation of pregnancy -Doing well -Discussed usage of OTC stool softener for constipation and decrease incidents of hemorrhoids.  -Reassured that increased discharge is normal.   3. Polyhydramnios affecting pregnancy -AFI 27.62 on 07/18/2023 -Discussed IOL b/t 39-40 weeks. -Cautioned that these dates may change based on MFM recommendation. -Reassured that poly does not constitute automatic c/s. -Repeat US scheduled for 2/28       Meds: No orders of the defined types were placed in this encounter.  Labs/procedures today:  Lab Orders  No laboratory test(s) ordered today     Reviewed: Preterm labor symptoms and general obstetric precautions including but not limited to vaginal bleeding, contractions, leaking of fluid and fetal movement were reviewed in detail with the  patient.  All questions were answered.  Follow-up: Return in about 1 week (around 08/05/2023).  No orders of the defined types were placed in this encounter.  Cherre Robins MSN, CNM 07/29/2023

## 2023-07-30 ENCOUNTER — Encounter (HOSPITAL_COMMUNITY): Payer: Self-pay | Admitting: Obstetrics and Gynecology

## 2023-07-30 ENCOUNTER — Inpatient Hospital Stay (HOSPITAL_COMMUNITY)
Admission: AD | Admit: 2023-07-30 | Discharge: 2023-07-30 | Disposition: A | Discharge status: Home / Self Care | Payer: Commercial Managed Care - PPO | Attending: Obstetrics and Gynecology | Admitting: Obstetrics and Gynecology

## 2023-07-30 ENCOUNTER — Ambulatory Visit: Payer: Commercial Managed Care - PPO

## 2023-07-30 DIAGNOSIS — O479 False labor, unspecified: Secondary | ICD-10-CM | POA: Diagnosis not present

## 2023-07-30 DIAGNOSIS — E669 Obesity, unspecified: Secondary | ICD-10-CM

## 2023-07-30 DIAGNOSIS — O99213 Obesity complicating pregnancy, third trimester: Secondary | ICD-10-CM | POA: Insufficient documentation

## 2023-07-30 DIAGNOSIS — O0993 Supervision of high risk pregnancy, unspecified, third trimester: Secondary | ICD-10-CM | POA: Insufficient documentation

## 2023-07-30 DIAGNOSIS — Z3493 Encounter for supervision of normal pregnancy, unspecified, third trimester: Secondary | ICD-10-CM

## 2023-07-30 DIAGNOSIS — Z3A35 35 weeks gestation of pregnancy: Secondary | ICD-10-CM

## 2023-07-30 DIAGNOSIS — R102 Pelvic and perineal pain: Secondary | ICD-10-CM | POA: Diagnosis present

## 2023-07-30 DIAGNOSIS — Z0371 Encounter for suspected problem with amniotic cavity and membrane ruled out: Secondary | ICD-10-CM | POA: Diagnosis not present

## 2023-07-30 DIAGNOSIS — O4703 False labor before 37 completed weeks of gestation, third trimester: Secondary | ICD-10-CM | POA: Diagnosis not present

## 2023-07-30 LAB — URINALYSIS, ROUTINE W REFLEX MICROSCOPIC
Bilirubin Urine: NEGATIVE
Glucose, UA: NEGATIVE mg/dL
Hgb urine dipstick: NEGATIVE
Ketones, ur: NEGATIVE mg/dL
Leukocytes,Ua: NEGATIVE
Nitrite: NEGATIVE
Protein, ur: 30 mg/dL — AB
Specific Gravity, Urine: 1.018 (ref 1.005–1.030)
pH: 7 (ref 5.0–8.0)

## 2023-07-30 LAB — POCT FERN TEST: POCT Fern Test: NEGATIVE

## 2023-07-30 LAB — WET PREP, GENITAL
Clue Cells Wet Prep HPF POC: NONE SEEN
Sperm: NONE SEEN
Trich, Wet Prep: NONE SEEN
WBC, Wet Prep HPF POC: >=10 — AB (ref <10)
Yeast Wet Prep HPF POC: NONE SEEN

## 2023-07-30 NOTE — MAU Provider Note (Signed)
History     CSN: [id number]  Arrival date and time: 07/30/23 1323   Event Date/Time   First Provider Initiated Contact with Patient 07/30/23 1421      Chief Complaint  Patient presents with   Contractions   [facility] , a  28 y.o. G1P0000 at [redacted]w[redacted]d presents to MAU with complaints of abdominal cramping and leaking of fluid. Patient states that she has been having contractions since last night. Reports that they feel like period cramps and states that they last a few minutes then ease off. She also noted abdominal tightening. She also endorses for the last few days an increase in vaginal discharge. She is unsure if it is urine or if she is leaking. She states that she has been experecxing some incontinence throughout the week but states that last night she noted a gush and has been feeling trickles throughout the day today. She denies vaginal bleeding and endorses positive fetal movement.          OB History     Gravida  1   Para  0   Term  0   Preterm  0   AB  0   Living  0      SAB  0   IAB  0   Ectopic  0   Multiple  0   Live Births  0           Past Medical History:  Diagnosis Date   Anxiety    COVID-19    Depression    Depression, major, recurrent, moderate (HCC) 05/25/2012   Dysmenorrhea 03/12/2022   Gastroenteritis 04/22/2023   History of pyelonephritis 07/23/2018   History of pyelonephritis 07/23/2018   LGA (large for gestational age) fetus affecting management of mother 05/05/2023   Mucocele of lip 01/2020   Nicotine dependence, cigarettes, uncomplicated 07/23/2018   Obesity (BMI 30-39.9) 07/23/2018   [facility] b[facility]   Pityriasis versicolor 03/19/2021   Recurrent UTI 07/23/2018   Sore throat 05/06/2023   Thyroid disorder     Past Surgical History:  Procedure Laterality Date   ORAL MUCOCELE EXCISION      Family History  Problem Relation Age of Onset   COPD Mother    Hypertension Mother    Hypertension Father     Heart attack Father    High Cholesterol Father    Depression Sister    Asthma Sister    Depression Sister    Drug abuse Sister    Breast cancer Maternal Grandmother    Diabetes Maternal Grandfather    Hearing loss Maternal Grandfather    Heart attack Maternal Grandfather    Heart disease Maternal Grandfather    Hypertension Maternal Grandfather    Hyperlipidemia Maternal Grandfather    Pancreatic cancer Paternal Grandmother    Arthritis Paternal Grandmother    Hypertension Paternal Grandmother    ADD / ADHD Paternal Grandfather    Alcohol abuse Paternal Grandfather    COPD Paternal Grandfather    Diabetes Paternal Grandfather    Hearing loss Paternal Grandfather    Hyperlipidemia Paternal Grandfather    Hypertension Paternal Grandfather    Stroke Paternal Grandfather     Social History   Tobacco Use   Smoking status: Former    Types: Cigarettes   Smokeless tobacco: Never  Vaping Use   Vaping status: Some Days   Substances: Nicotine  Substance Use Topics   Alcohol use: Not Currently    Comment: social   Drug use: No  Allergies:  Allergies  Allergen Reactions   Latex Rash   Nickel Dermatitis    Medications Prior to Admission  Medication Sig Dispense Refill Last Dose/Taking   citalopram (CELEXA) 40 MG tablet Take 1 tablet (40 mg total) by mouth daily. 90 tablet 3 07/30/2023   Prenatal Vit-Fe Fumarate-FA (MULTIVITAMIN-PRENATAL) 27-0.8 MG TABS tablet Take 1 tablet by mouth daily at 12 noon.   07/30/2023   hydrocortisone (ANUSOL-HC) 2.5 % rectal cream Place 1 Application rectally 2 (two) times daily. (Patient not taking: Reported on 07/11/2023) 30 g 0    ondansetron (ZOFRAN-ODT) 4 MG disintegrating tablet Take 1 tablet (4 mg total) by mouth every 6 (six) hours as needed for nausea. (Patient not taking: Reported on 07/29/2023) 30 tablet 3     Review of Systems  Constitutional:  Negative for chills, fatigue and fever.  Eyes:  Negative for pain and visual disturbance.   Respiratory:  Negative for apnea, shortness of breath and wheezing.   Cardiovascular:  Negative for chest pain and palpitations.  Gastrointestinal:  Positive for abdominal pain. Negative for constipation, diarrhea, nausea and vomiting.  Genitourinary:  Positive for pelvic pain and vaginal discharge. Negative for difficulty urinating, dysuria, vaginal bleeding and vaginal pain.  Musculoskeletal:  Negative for back pain.  Neurological:  Negative for seizures, weakness and headaches.  Psychiatric/Behavioral:  Negative for suicidal ideas.    Physical Exam   Blood pressure 137/85, pulse 87, temperature 98.2 F (36.8 C), resp. rate 18, height 5' 2.5" (1.588 m), weight 94.3 kg, last menstrual period 11/24/2022, SpO2 100%.  Physical Exam Vitals and nursing note reviewed. Exam conducted with a chaperone present.  Constitutional:      General: She is not in acute distress.    Appearance: Normal appearance.  HENT:     Head: Normocephalic.  Pulmonary:     Effort: Pulmonary effort is normal.  Abdominal:     Palpations: Abdomen is soft.     Tenderness: There is no abdominal tenderness.     Comments: Gravid uterus   Genitourinary:    Comments: Normal White vaginal discharge noted. Negative for pooling on spec exam.   Dilation: Fingertip Exam by:: Rhunette Croft, CNM  Musculoskeletal:     Cervical back: Normal range of motion.  Skin:    General: Skin is warm and dry.  Neurological:     Mental Status: She is alert and oriented to person, place, and time.  Psychiatric:        Mood and Affect: Mood normal.    FHT: 130bpm with moderate variability 15x15 accels no decles  Toco UI   MAU Course  Procedures Orders Placed This Encounter  Procedures   Wet prep, genital   Urinalysis, Routine w reflex microscopic -Urine, Clean Catch   Fern Test   Results for orders placed or performed during the hospital encounter of 07/30/23 (from the past 24 hours)  Urinalysis, Routine w reflex microscopic  -Urine, Clean Catch     Status: Abnormal   Collection Time: 07/30/23  2:11 PM  Result Value Ref Range   Color, Urine YELLOW YELLOW   APPearance CLEAR CLEAR   Specific Gravity, Urine 1.018 1.005 - 1.030   pH 7.0 5.0 - 8.0   Glucose, UA NEGATIVE NEGATIVE mg/dL   Hgb urine dipstick NEGATIVE NEGATIVE   Bilirubin Urine NEGATIVE NEGATIVE   Ketones, ur NEGATIVE NEGATIVE mg/dL   Protein, ur 30 (A) NEGATIVE mg/dL   Nitrite NEGATIVE NEGATIVE   Leukocytes,Ua NEGATIVE NEGATIVE   RBC / HPF  0-5 0 - 5 RBC/hpf   WBC, UA 0-5 0 - 5 WBC/hpf   Bacteria, UA RARE (A) NONE SEEN   Squamous Epithelial / HPF 0-5 0 - 5 /HPF   Mucus PRESENT   Fern Test     Status: None   Collection Time: 07/30/23  2:53 PM  Result Value Ref Range   POCT Fern Test Negative = intact amniotic membranes     MDM - Negative fern negative pooling low suspicion for SROM.  - Wet prep  - UA notable for bacteria and mucus otherwise normal . - Low suspicion for PTL.  - plan for discharge.   Assessment and Plan   1. Supervision of high risk pregnancy, antepartum, third trimester   2. False labor   3. Intact amniotic membranes during pregnancy in third trimester   4. [redacted] weeks gestation of pregnancy   5. Obesity (BMI 30-39.9)-pre-G BMI 29.81    - Discussed signs and symptoms of labor.  - Reviewed worsening signs and return precautions.  - FHT appropriate for gestational age at time of discharge.  - Patient discharged home in stable condition and may return to MAU as needed.   Claudette Head, MSN CNM  07/30/2023, 2:22 PM

## 2023-07-30 NOTE — MAU Note (Signed)
.  Barbara Lutz is a 28 y.o. at [redacted]w[redacted]d here in MAU reporting: reports she started feeling period like cramping this morning . Every few minutes. Good fetal movement felt. Denies any vag bleeding but does feel like she is leaking somethin.   LMP:  Onset of complaint: this morning Pain score: 6-7 Vitals:   07/30/23 1350  BP: 130/74  Pulse: 99  Resp: 18  Temp: 98.2 F (36.8 C)     FHT: 132  Lab orders placed from triage: u/a

## 2023-08-05 ENCOUNTER — Ambulatory Visit: Payer: Commercial Managed Care - PPO | Admitting: Psychology

## 2023-08-05 ENCOUNTER — Other Ambulatory Visit: Payer: Commercial Managed Care - PPO

## 2023-08-06 ENCOUNTER — Ambulatory Visit (INDEPENDENT_AMBULATORY_CARE_PROVIDER_SITE_OTHER): Payer: Commercial Managed Care - PPO | Admitting: Family Medicine

## 2023-08-06 ENCOUNTER — Ambulatory Visit: Payer: Commercial Managed Care - PPO | Admitting: Psychology

## 2023-08-06 ENCOUNTER — Other Ambulatory Visit (HOSPITAL_COMMUNITY)
Admission: RE | Admit: 2023-08-06 | Discharge: 2023-08-06 | Disposition: A | Discharge status: Home / Self Care | Payer: Commercial Managed Care - PPO | Source: Ambulatory Visit | Attending: Family Medicine | Admitting: Family Medicine

## 2023-08-06 VITALS — BP 125/79 | HR 101 | Wt 207.0 lb

## 2023-08-06 DIAGNOSIS — O409XX0 Polyhydramnios, unspecified trimester, not applicable or unspecified: Secondary | ICD-10-CM | POA: Diagnosis not present

## 2023-08-06 DIAGNOSIS — O0993 Supervision of high risk pregnancy, unspecified, third trimester: Secondary | ICD-10-CM | POA: Diagnosis not present

## 2023-08-06 DIAGNOSIS — F411 Generalized anxiety disorder: Secondary | ICD-10-CM | POA: Diagnosis not present

## 2023-08-06 DIAGNOSIS — E669 Obesity, unspecified: Secondary | ICD-10-CM

## 2023-08-06 DIAGNOSIS — F4323 Adjustment disorder with mixed anxiety and depressed mood: Secondary | ICD-10-CM | POA: Diagnosis not present

## 2023-08-06 NOTE — Progress Notes (Signed)
[facility] Counselor/Therapist Progress Note  Patient ID: Austine Kelsay, MRN: [id number],    Date: 08/06/2023  Time Spent: 4:00pm-4:50pm   50 minutes   Treatment Type: Individual Therapy  Reported Symptoms: stress  Mental Status Exam: Appearance:  Casual     Behavior: Appropriate  Motor: Normal  Speech/Language:  Normal Rate  Affect: Appropriate  Mood: normal  Thought process: normal  Thought content:   WNL  Sensory/Perceptual disturbances:   WNL  Orientation: oriented to person, place, time/date, and situation  Attention: Good  Concentration: Good  Memory: WNL  Fund of knowledge:  Good  Insight:   Good  Judgment:  Good  Impulse Control: Good   Risk Assessment: Danger to Self:  No Self-injurious Behavior: No Danger to Others: No Duty to Warn:no Physical Aggression / Violence:No  Access to Firearms a concern: No  Gang Involvement:No   Subjective: Pt present for face-to-face individual therapy via video.  Pt consents to telehealth video session and is aware of limitations and benefits of virtual session.  Location of pt: home Location of therapist: home office.   Pt talked about her pregnancy.   Pt is [redacted] weeks pregnant and will be induced at 39 weeks. Pt is excited but also nervous about the labor experience.   Pt has not had contact with the baby's father Vonna Kotyk.   Vonna Kotyk is wanting a paternity test.   Pt does not think that he will be very involved with the baby even though he is the father.  Pt is sad their relationship is not like it use to be when they were close.      Pt is on Celexa for anxiety and depression.  The dose was recently increased and pt states the medication is really helping her.  Pt is working on keeping a positive mindset as well.  Pt is trying to rely on her family and friends who are supportive of her.  Pt had a baby shower that went very well and she has everything she needs for the baby.   Pt talked about work.  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  Pt will get 12 weeks maternity leave.    Worked on self care strategies. Provided supportive therapy.   Interventions: Cognitive Behavioral Therapy and Insight-Oriented  Diagnosis: F43.23  Plan of Care: Recommend ongoing therapy.   Pt participated in setting treatment goals.   Pt wants to improve coping skills and cope without using weed or getting angry.   Pt wants to manage stress better.    Plan to meet monthly.   Pt agrees with treatment plan.    Treatment Plan (Treatment Plan Target Date:  02/20/2024) Client Abilities/Strengths  Pt is bright, engaging, and motivated for therapy.  Client Treatment Preferences  Individual therapy.  Client Statement of Needs  Improve copings skills and understand herself better. Improve self esteem.  Symptoms  Depressed or irritable mood. Excessive and/or unrealistic worry that is difficult to control occurring more days than not for at least 6 months about a number of events or activities. Hypervigilance (e.g., feeling constantly on edge, experiencing concentration difficulties, having trouble falling or staying asleep, exhibiting a general state of irritability). Low self-esteem. Problems Addressed  Unipolar Depression, Anxiety Goals 1. Alleviate depressive symptoms and return to previous level of effective functioning. 2. Appropriately grieve the loss in order to normalize mood and to return to previously adaptive level of functioning. Objective Learn and implement behavioral strategies to overcome  depression. Target Date: 2024-02-20 Frequency: Monthly  Progress: 30 Modality: individual  Related Interventions Assist the client in developing skills that increase the likelihood of deriving pleasure from behavioral activation (e.g., assertiveness skills, developing an exercise plan, less internal/more external focus, increased social involvement); reinforce success. Engage the client in "behavioral activation,"  increasing his/her activity level and contact with sources of reward, while identifying processes that inhibit activation. use behavioral techniques such as instruction, rehearsal, role-playing, role reversal, as needed, to facilitate activity in the client's daily life; reinforce success. 3. Develop healthy interpersonal relationships that lead to the alleviation and help prevent the relapse of depression. 4. Develop healthy thinking patterns and beliefs about self, others, and the world that lead to the alleviation and help prevent the relapse of depression. 5. Enhance ability to effectively cope with the full variety of life's worries and anxieties. 6. Learn and implement coping skills that result in a reduction of anxiety and worry, and improved daily functioning. Objective Learn and implement problem-solving strategies for realistically addressing worries. Target Date: 2024-02-20 Frequency: Monthly  Progress: 30 Modality: individual  Related Interventions Assign the client a homework exercise in which he/she problem-solves a current problem (see Mastery of Your Anxiety and Worry: Workbook by Elenora Fender and Filbert Schilder or Generalized Anxiety Disorder by Elesa Hacker, and Filbert Schilder); review, reinforce success, and provide corrective feedback toward improvement. Teach the client problem-solving strategies involving specifically defining a problem, generating options for addressing it, evaluating the pros and cons of each option, selecting and implementing an optional action, and reevaluating and refining the action. Objective Learn and implement calming skills to reduce overall anxiety and manage anxiety symptoms. Target Date: 2024-02-20 Frequency: Monthly  Progress: 30 Modality: individual  Related Interventions Assign the client to read about progressive muscle relaxation and other calming strategies in relevant books or treatment manuals (e.g., Progressive Relaxation Training by Twana First;  Mastery of Your Anxiety and Worry: Workbook by Earlie Counts). Assign the client homework each session in which he/she practices relaxation exercises daily, gradually applying them progressively from non-anxiety-provoking to anxiety-provoking situations; review and reinforce success while providing corrective feedback toward improvement. Teach the client calming/relaxation skills (e.g., applied relaxation, progressive muscle relaxation, cue controlled relaxation; mindful breathing; biofeedback) and how to discriminate better between relaxation and tension; teach the client how to apply these skills to his/her daily life. 7. Recognize, accept, and cope with feelings of depression. 8. Reduce overall frequency, intensity, and duration of the anxiety so that daily functioning is not impaired. 9. Resolve the core conflict that is the source of anxiety. 10. Stabilize anxiety level while increasing ability to function on a daily basis. Diagnosis F43.23 Conditions For Discharge Achievement of treatment goals and objectives   Salomon Fick, LCSW

## 2023-08-06 NOTE — Addendum Note (Signed)
Addended by: Billey Chang A on: 08/06/2023 10:34 AM   Modules accepted: Orders

## 2023-08-06 NOTE — Progress Notes (Signed)
   PRENATAL VISIT NOTE  Subjective:  Barbara Lutz is a 28 y.o. G1P0000 at [redacted]w[redacted]d being seen today for ongoing prenatal care.  She is currently monitored for the following issues for this high-risk pregnancy and has Obesity (BMI 30-39.9)-pre-G BMI 29.81; GAD (generalized anxiety disorder); Abnormal urine cytology; Supervision of high risk pregnancy, antepartum, third trimester; and Polyhydramnios affecting pregnancy on their problem list.  Patient reports occasional contractions.  Contractions: Irregular. Vag. Bleeding: None.  Movement: Present. Denies leaking of fluid.   The following portions of the patient's history were reviewed and updated as appropriate: allergies, current medications, past family history, past medical history, past social history, past surgical history and problem list.   Objective:   Vitals:   08/06/23 0809  BP: 125/79  Pulse: (!) 101  Weight: 207 lb (93.9 kg)    Fetal Status: Fetal Heart Rate (bpm): 124   Movement: Present     General:  Alert, oriented and cooperative. Patient is in no acute distress.  Skin: Skin is warm and dry. No rash noted.   Cardiovascular: Normal heart rate noted  Respiratory: Normal respiratory effort, no problems with respiration noted  Abdomen: Soft, gravid, appropriate for gestational age.  Pain/Pressure: Present     Pelvic: Cervical exam performed in the presence of a chaperone        Extremities: Normal range of motion.  Edema: Mild pitting, slight indentation  Mental Status: Normal mood and affect. Normal behavior. Normal judgment and thought content.   Assessment and Plan:  Pregnancy: G1P0000 at [redacted]w[redacted]d 1. Supervision of high risk pregnancy, antepartum, third trimester (Primary) FHT normal  2. Polyhydramnios affecting pregnancy Induction at 39 weeks Continue weekly testing   3. GAD (generalized anxiety disorder)  4. Obesity (BMI 30-39.9)-pre-G BMI 29.81   Preterm labor symptoms and general obstetric precautions  including but not limited to vaginal bleeding, contractions, leaking of fluid and fetal movement were reviewed in detail with the patient. Please refer to After Visit Summary for other counseling recommendations.   No follow-ups on file.  Future Appointments  Date Time Provider Department Center  08/06/2023  4:00 PM Marlinda Mike, [place] [facility] None  08/12/2023  1:10 PM Gerrit Heck, CNM [facility] None  08/15/2023  3:30 PM [facility]  08/21/2023  8:15 AM Levie Heritage, DO [facility] None  08/27/2023  8:15 AM Levie Heritage, DO [facility] None  09/08/2023 11:00 AM Bauert, Rella Larve, LCSW [facility] None    Levie Heritage, DO

## 2023-08-07 LAB — CERVICOVAGINAL ANCILLARY ONLY
Chlamydia: NEGATIVE
Comment: NEGATIVE
Comment: NORMAL
Neisseria Gonorrhea: NEGATIVE

## 2023-08-08 LAB — STREP GP B NAA: Strep Gp B NAA: NEGATIVE

## 2023-08-11 ENCOUNTER — Inpatient Hospital Stay (HOSPITAL_COMMUNITY)
Admission: AD | Admit: 2023-08-11 | Discharge: 2023-08-11 | Disposition: A | Discharge status: Home / Self Care | Payer: Commercial Managed Care - PPO | Attending: Obstetrics and Gynecology | Admitting: Obstetrics and Gynecology

## 2023-08-11 ENCOUNTER — Encounter (HOSPITAL_COMMUNITY): Payer: Self-pay | Admitting: Obstetrics and Gynecology

## 2023-08-11 DIAGNOSIS — Z3A37 37 weeks gestation of pregnancy: Secondary | ICD-10-CM

## 2023-08-11 DIAGNOSIS — O479 False labor, unspecified: Secondary | ICD-10-CM | POA: Diagnosis not present

## 2023-08-11 DIAGNOSIS — O471 False labor at or after 37 completed weeks of gestation: Secondary | ICD-10-CM | POA: Diagnosis not present

## 2023-08-11 NOTE — MAU Note (Signed)
.  Barbara Lutz is a 28 y.o. at [redacted]w[redacted]d here in MAU reporting: CTX's since 0700 this morning that are currently five minutes apart. Denies VB or LOF. +FM.   GBS-. Poly. She reports she was 2.5cm last week in office.   Onset of complaint: 0700 Pain score: 8/10 lower abdomen  Vitals:   08/11/23 1152  BP: 124/82  Pulse: 87  Resp: 16  Temp: 98 F (36.7 C)  SpO2: 100%      FHT: 145 initial external Lab orders placed from triage: MAU Labor Eval

## 2023-08-11 NOTE — MAU Provider Note (Signed)
 Barbara Lutz is a 28 y.o. G1P0000 female at [redacted]w[redacted]d  RN Labor check, not seen by provider SVE by RN: Dilation: 1.5 Effacement (%): 50 Station: Ballotable Exam by:: jolynn NST: FHR baseline 135 bpm, Variability: moderate, Accelerations:present, Decelerations:  Absent= Cat 1/Reactive Toco: irregular, every 5-7 minutes  D/C home  Claudette Head CNM,  08/11/2023 1:39 PM

## 2023-08-12 ENCOUNTER — Ambulatory Visit (INDEPENDENT_AMBULATORY_CARE_PROVIDER_SITE_OTHER): Payer: Commercial Managed Care - PPO | Admitting: Family Medicine

## 2023-08-12 VITALS — BP 129/86 | HR 92 | Wt 208.0 lb

## 2023-08-12 DIAGNOSIS — Z3A37 37 weeks gestation of pregnancy: Secondary | ICD-10-CM

## 2023-08-12 DIAGNOSIS — F411 Generalized anxiety disorder: Secondary | ICD-10-CM

## 2023-08-12 DIAGNOSIS — O409XX0 Polyhydramnios, unspecified trimester, not applicable or unspecified: Secondary | ICD-10-CM

## 2023-08-12 DIAGNOSIS — E669 Obesity, unspecified: Secondary | ICD-10-CM

## 2023-08-12 DIAGNOSIS — O0993 Supervision of high risk pregnancy, unspecified, third trimester: Secondary | ICD-10-CM

## 2023-08-12 NOTE — Progress Notes (Signed)
   PRENATAL VISIT NOTE  Subjective:  Barbara Lutz is a 28 y.o. G1P0000 at [redacted]w[redacted]d being seen today for ongoing prenatal care.  She is currently monitored for the following issues for this high-risk pregnancy and has Obesity (BMI 30-39.9)-pre-G BMI 29.81; GAD (generalized anxiety disorder); Abnormal urine cytology; Supervision of high risk pregnancy, antepartum, third trimester; and Polyhydramnios affecting pregnancy on their problem list.  Patient reports no bleeding, no cramping, and no leaking.  Contractions: Irregular. Vag. Bleeding: None.  Movement: Present. Denies leaking of fluid.   The following portions of the patient's history were reviewed and updated as appropriate: allergies, current medications, past family history, past medical history, past social history, past surgical history and problem list.   Objective:   Vitals:   08/12/23 1302  BP: 129/86  Pulse: 92  Weight: 208 lb (94.3 kg)    Fetal Status: Fetal Heart Rate (bpm): 134   Movement: Present     General:  Alert, oriented and cooperative. Patient is in no acute distress.  Skin: Skin is warm and dry. No rash noted.   Cardiovascular: Normal heart rate noted  Respiratory: Normal respiratory effort, no problems with respiration noted  Abdomen: Soft, gravid, appropriate for gestational age.  Pain/Pressure: Absent     Pelvic: Cervical exam deferred        Extremities: Normal range of motion.  Edema: Mild pitting, slight indentation  Mental Status: Normal mood and affect. Normal behavior. Normal judgment and thought content.   Assessment and Plan:  Pregnancy: G1P0000 at [redacted]w[redacted]d 1. Supervision of high risk pregnancy, antepartum, third trimester (Primary) FHR and BP appropriate today  2. Polyhydramnios affecting pregnancy AFI 27.60 with MVP 9.54 at last ultrasound.  Scheduled for repeat ultrasound on 2/28. Planning induction around 39 weeks  3. GAD (generalized anxiety disorder)  4. Obesity (BMI 30-39.9)-pre-G BMI  29.81  5. [redacted] weeks gestation of pregnancy  Term labor symptoms and general obstetric precautions including but not limited to vaginal bleeding, contractions, leaking of fluid and fetal movement were reviewed in detail with the patient. Please refer to After Visit Summary for other counseling recommendations.   No follow-ups on file.  Future Appointments  Date Time Provider Department Center  08/15/2023  3:30 PM [facility] US3 [facility]  08/21/2023  8:15 AM Levie Heritage, DO [facility] None  08/27/2023  8:15 AM Levie Heritage, DO [facility] None  09/08/2023 11:00 AM Bauert, Rella Larve, LCSW [facility] None  10/10/2023  4:00 PM Bauert, Rella Larve, LCSW [facility] None    Celedonio Savage, MD

## 2023-08-15 ENCOUNTER — Other Ambulatory Visit: Payer: Self-pay | Admitting: Maternal & Fetal Medicine

## 2023-08-15 ENCOUNTER — Ambulatory Visit (HOSPITAL_BASED_OUTPATIENT_CLINIC_OR_DEPARTMENT_OTHER): Payer: Commercial Managed Care - PPO

## 2023-08-15 ENCOUNTER — Ambulatory Visit: Payer: Commercial Managed Care - PPO | Attending: Obstetrics and Gynecology | Admitting: Obstetrics and Gynecology

## 2023-08-15 VITALS — BP 132/84

## 2023-08-15 DIAGNOSIS — O403XX0 Polyhydramnios, third trimester, not applicable or unspecified: Secondary | ICD-10-CM

## 2023-08-15 DIAGNOSIS — Z3A37 37 weeks gestation of pregnancy: Secondary | ICD-10-CM

## 2023-08-15 DIAGNOSIS — O409XX0 Polyhydramnios, unspecified trimester, not applicable or unspecified: Secondary | ICD-10-CM | POA: Insufficient documentation

## 2023-08-15 DIAGNOSIS — O3663X0 Maternal care for excessive fetal growth, third trimester, not applicable or unspecified: Secondary | ICD-10-CM | POA: Diagnosis not present

## 2023-08-15 DIAGNOSIS — O0993 Supervision of high risk pregnancy, unspecified, third trimester: Secondary | ICD-10-CM | POA: Insufficient documentation

## 2023-08-15 NOTE — Progress Notes (Signed)
 Maternal-Fetal Medicine Consultation I had the pleasure of seeing Ms. Barbara L[facility] today at [facility]. She is G1 P0 at 37w 4d gestation and is here for fetal growth assessment and antenatal testing.  Polyhydramnios was seen at previous ultrasound. Patient reports she has mild discomfort on polyhydramnios.  Ultrasound On today's ultrasound, severe polyhydramnios is seen (AFI 32 cm).  Cephalic presentation.  Fetal growth is appropriate for gestational age.  Antenatal testing is reassuring.  BPP 8/8.  I explained the finding of polyhydramnios.  If patient is symptomatic, I recommend delivery at [redacted] weeks gestation.  Polyhydramnios can lead to rupture of membranes and cord prolapse.  Recommendations -Delivery at [redacted] weeks gestation. -Message sent to Dr. Nobie Putnam.  Consultation including face-to-face (more than 50%) counseling 10 minutes.

## 2023-08-17 ENCOUNTER — Encounter (HOSPITAL_COMMUNITY): Payer: Self-pay | Admitting: Obstetrics & Gynecology

## 2023-08-17 ENCOUNTER — Inpatient Hospital Stay (HOSPITAL_COMMUNITY)
Admission: RE | Admit: 2023-08-17 | Discharge: 2023-08-21 | DRG: 787 | Disposition: A | Discharge status: Home / Self Care | Payer: Commercial Managed Care - PPO | Attending: Family Medicine | Admitting: Family Medicine

## 2023-08-17 ENCOUNTER — Other Ambulatory Visit: Payer: Self-pay

## 2023-08-17 ENCOUNTER — Inpatient Hospital Stay (HOSPITAL_COMMUNITY): Payer: Commercial Managed Care - PPO

## 2023-08-17 DIAGNOSIS — Z8249 Family history of ischemic heart disease and other diseases of the circulatory system: Secondary | ICD-10-CM

## 2023-08-17 DIAGNOSIS — O9081 Anemia of the puerperium: Secondary | ICD-10-CM | POA: Diagnosis not present

## 2023-08-17 DIAGNOSIS — O99824 Streptococcus B carrier state complicating childbirth: Secondary | ICD-10-CM | POA: Diagnosis present

## 2023-08-17 DIAGNOSIS — O4292 Full-term premature rupture of membranes, unspecified as to length of time between rupture and onset of labor: Secondary | ICD-10-CM | POA: Diagnosis present

## 2023-08-17 DIAGNOSIS — Z833 Family history of diabetes mellitus: Secondary | ICD-10-CM | POA: Diagnosis not present

## 2023-08-17 DIAGNOSIS — Z3490 Encounter for supervision of normal pregnancy, unspecified, unspecified trimester: Secondary | ICD-10-CM | POA: Insufficient documentation

## 2023-08-17 DIAGNOSIS — O99214 Obesity complicating childbirth: Secondary | ICD-10-CM | POA: Diagnosis present

## 2023-08-17 DIAGNOSIS — O409XX0 Polyhydramnios, unspecified trimester, not applicable or unspecified: Secondary | ICD-10-CM | POA: Diagnosis present

## 2023-08-17 DIAGNOSIS — Z98891 History of uterine scar from previous surgery: Secondary | ICD-10-CM

## 2023-08-17 DIAGNOSIS — Z8616 Personal history of COVID-19: Secondary | ICD-10-CM | POA: Diagnosis not present

## 2023-08-17 DIAGNOSIS — D62 Acute posthemorrhagic anemia: Secondary | ICD-10-CM | POA: Diagnosis not present

## 2023-08-17 DIAGNOSIS — Z3A38 38 weeks gestation of pregnancy: Secondary | ICD-10-CM | POA: Diagnosis not present

## 2023-08-17 DIAGNOSIS — Z3A00 Weeks of gestation of pregnancy not specified: Secondary | ICD-10-CM | POA: Diagnosis not present

## 2023-08-17 DIAGNOSIS — Z87891 Personal history of nicotine dependence: Secondary | ICD-10-CM

## 2023-08-17 DIAGNOSIS — O403XX0 Polyhydramnios, third trimester, not applicable or unspecified: Principal | ICD-10-CM | POA: Diagnosis present

## 2023-08-17 LAB — COMPREHENSIVE METABOLIC PANEL
ALT: 19 U/L (ref 0–44)
AST: 26 U/L (ref 15–41)
Albumin: 2.9 g/dL — ABNORMAL LOW (ref 3.5–5.0)
Alkaline Phosphatase: 182 U/L — ABNORMAL HIGH (ref 38–126)
Anion gap: 7 (ref 5–15)
BUN: 6 mg/dL (ref 6–20)
CO2: 22 mmol/L (ref 22–32)
Calcium: 8.7 mg/dL — ABNORMAL LOW (ref 8.9–10.3)
Chloride: 106 mmol/L (ref 98–111)
Creatinine, Ser: 0.77 mg/dL (ref 0.44–1.00)
GFR, Estimated: >60 mL/min (ref >60)
Glucose, Bld: 64 mg/dL — ABNORMAL LOW (ref 70–99)
Potassium: 3.6 mmol/L (ref 3.5–5.1)
Sodium: 135 mmol/L (ref 135–145)
Total Bilirubin: 0.4 mg/dL (ref 0.0–1.2)
Total Protein: 6.9 g/dL (ref 6.5–8.1)

## 2023-08-17 LAB — TYPE AND SCREEN
ABO/RH(D): B POS
Antibody Screen: NEGATIVE

## 2023-08-17 LAB — CBC
HCT: 34.8 % — ABNORMAL LOW (ref 36.0–46.0)
Hemoglobin: 11.4 g/dL — ABNORMAL LOW (ref 12.0–15.0)
MCH: 28.9 pg (ref 26.0–34.0)
MCHC: 32.8 g/dL (ref 30.0–36.0)
MCV: 88.3 fL (ref 80.0–100.0)
Platelets: 199 10*3/uL (ref 150–400)
RBC: 3.94 MIL/uL (ref 3.87–5.11)
RDW: 13.3 % (ref 11.5–15.5)
WBC: 10.9 10*3/uL — ABNORMAL HIGH (ref 4.0–10.5)
nRBC: 0 % (ref 0.0–0.2)

## 2023-08-17 MED ORDER — OXYCODONE-ACETAMINOPHEN 5-325 MG PO TABS: 2.0000 | ORAL_TABLET | ORAL | Status: DC | PRN

## 2023-08-17 MED ORDER — OXYTOCIN-SODIUM CHLORIDE 30-0.9 UT/500ML-% IV SOLN
1.0000 m[IU]/min | INTRAVENOUS | Status: DC
  Administered 2023-08-17: 8 m[IU]/min via INTRAVENOUS
  Administered 2023-08-17 – 2023-08-18 (×2): 2 m[IU]/min via INTRAVENOUS
  Filled 2023-08-17: qty 500

## 2023-08-17 MED ORDER — LACTATED RINGERS IV SOLN: INTRAVENOUS | Status: AC

## 2023-08-17 MED ORDER — OXYCODONE-ACETAMINOPHEN 5-325 MG PO TABS: 1.0000 | ORAL_TABLET | ORAL | Status: DC | PRN

## 2023-08-17 MED ORDER — OXYTOCIN BOLUS FROM INFUSION: 333.0000 mL | Freq: Once | INTRAVENOUS | Status: DC

## 2023-08-17 MED ORDER — LACTATED RINGERS IV SOLN: 500.0000 mL | INTRAVENOUS | Status: AC | PRN

## 2023-08-17 MED ORDER — FENTANYL CITRATE (PF) 100 MCG/2ML IJ SOLN
50.0000 ug | INTRAMUSCULAR | Status: DC | PRN
  Administered 2023-08-18: 100 ug via INTRAVENOUS
  Filled 2023-08-17: qty 2

## 2023-08-17 MED ORDER — LIDOCAINE HCL (PF) 1 % IJ SOLN: 30.0000 mL | INTRAMUSCULAR | Status: DC | PRN

## 2023-08-17 MED ORDER — OXYTOCIN-SODIUM CHLORIDE 30-0.9 UT/500ML-% IV SOLN
2.5000 [IU]/h | INTRAVENOUS | Status: DC
  Filled 2023-08-17: qty 500

## 2023-08-17 MED ORDER — SOD CITRATE-CITRIC ACID 500-334 MG/5ML PO SOLN
30.0000 mL | ORAL | Status: DC | PRN
  Filled 2023-08-17: qty 30

## 2023-08-17 MED ORDER — TERBUTALINE SULFATE 1 MG/ML IJ SOLN: 0.2500 mg | Freq: Once | INTRAMUSCULAR | Status: DC | PRN

## 2023-08-17 MED ORDER — ONDANSETRON HCL 4 MG/2ML IJ SOLN
4.0000 mg | Freq: Four times a day (QID) | INTRAMUSCULAR | Status: DC | PRN
  Administered 2023-08-18: 4 mg via INTRAVENOUS
  Filled 2023-08-17: qty 2

## 2023-08-17 MED ORDER — ACETAMINOPHEN 325 MG PO TABS: 650.0000 mg | ORAL_TABLET | ORAL | Status: DC | PRN

## 2023-08-17 NOTE — Progress Notes (Signed)
 Barbara Lutz is a 28 y.o. female presenting for IOL for severe polyhydramnios 32 cm. No GDM.   Coping well up on ball. Discusse role, risks and benefits of AROM; pt agreeable.   Blood pressure (!) 134/93, pulse 96, temperature 98.4 F (36.9 C), temperature source Oral, resp. rate 18, height 5\' 2"  (1.575 m), weight 95.9 kg, last menstrual period 11/24/2022.  Dilation: 3 Effacement (%): Thick Station: -2 Presentation: Vertex Exam by:: Dr Leanora Cover  EFM: baseline 135, accels, no decels, moderate variability TOCO: q2-66min contractions  AROM light mec Continue to titrate pit Anticipate vaginal delivery  Wyn Forster, MD FMOB Fellow, Faculty practice [facility]

## 2023-08-17 NOTE — H&P (Signed)
 Barbara Lutz is a 28 y.o. female presenting for IOL for severe polyhydramnios 32 cm. No GDM. Otherwise nml [place]. Denie sLOF, VB. Mild contractions. Nml fetal mvmt.    NURSING  PROVIDER  Office Location [place] Dating by LMP c/w U/S at 9 wks  [facility] Model Traditional Anatomy U/S normal  Initiated care at  [facility] [employer]  English              LAB RESULTS   Support Person Jason(FOB)  Genetics NIPS: LR female AFP:   *Screen Negative*      NT/IT (FT only)     Carrier Screen Horizon: neg  Rhogam  B/Positive/-- (08/14 1113) A1C/GTT Early:  Third trimester:  Glucose, Fasting 78  Glucose, 1 hour 135  Glucose, 2 hour 124    Flu Vaccine     TDaP Vaccine 07/03/23 Blood Type B/Positive/-- (08/14 1113)  Covid Vaccine  Antibody Negative (08/14 1113)    Rubella 2.67 (08/14 1113)  Feeding Plan breast RPR Non Reactive (08/14 1113)  Contraception Unsure  HBsAg Negative (08/14 1113)  Circumcision Yes, if boy  HIV Non Reactive (08/14 1113)  Pediatrician  Unsure  HCVAb Non Reactive (08/14 1113)  Prenatal Classes       Pap No results found for: "DIAGPAP"  BTLConsent  GC/CT Initial:   36wks:    VBAC  Consent  GBS neg       DME Rx [ ]  BP cuff [ ]  Weight Scale Waterbirth  [ ]  Class [ ]  Consent [ ]  CNM visit  PHQ9 & GAD7 [  ] new OB [  ] 28 weeks  [  ] 36 weeks Induction  [ ]  Orders Entered [ ] Foley Y/N    Patient Active Problem List   Diagnosis Date Noted   Pregnancy 08/17/2023   Polyhydramnios affecting pregnancy 07/03/2023   Supervision of high risk pregnancy, antepartum, third trimester 01/29/2023   Abnormal urine cytology 02/12/2022   GAD (generalized anxiety disorder) 03/19/2021   Obesity (BMI 30-39.9)-pre-G BMI 29.81 07/23/2018     OB History     Gravida  1   Para  0   Term  0   Preterm  0   AB  0   Living  0      SAB  0   IAB  0   Ectopic  0   Multiple  0   Live Births  0          Past Medical History:  Diagnosis Date   Anxiety    COVID-19     Depression    Depression, major, recurrent, moderate (HCC) 05/25/2012   Dysmenorrhea 03/12/2022   Gastroenteritis 04/22/2023   History of pyelonephritis 07/23/2018   History of pyelonephritis 07/23/2018   Mucocele of lip 01/2020   Nicotine dependence, cigarettes, uncomplicated 07/23/2018   Obesity (BMI 30-39.9) 07/23/2018   [facility] bariatric counseling   Pityriasis versicolor 03/19/2021   Recurrent UTI 07/23/2018   Sore throat 05/06/2023   Thyroid disorder    Past Surgical History:  Procedure Laterality Date   ORAL MUCOCELE EXCISION     Family History: family history includes ADD / ADHD in her paternal grandfather; Alcohol abuse in her paternal grandfather; Arthritis in her paternal grandmother; Asthma in her sister; Breast cancer in her maternal grandmother; COPD in her mother and paternal grandfather; Depression in her sister and sister; Diabetes in her maternal grandfather and paternal  grandfather; Drug abuse in her sister; Hearing loss in her maternal grandfather and paternal grandfather; Heart attack in her father and maternal grandfather; Heart disease in her maternal grandfather; High Cholesterol in her father; Hyperlipidemia in her maternal grandfather and paternal grandfather; Hypertension in her father, maternal grandfather, mother, paternal grandfather, and paternal grandmother; Pancreatic cancer in her paternal grandmother; Stroke in her paternal grandfather. Social History:  reports that she has quit smoking. Her smoking use included cigarettes. She has never used smokeless tobacco. She reports that she does not currently use alcohol. She reports that she does not use drugs.     Maternal Diabetes: No Genetic Screening: Normal Maternal Ultrasounds/Referrals: Other: Polyhydramnios Fetal Ultrasounds or other Referrals:  Referred to Materal Fetal Medicine  Maternal Substance Abuse:  No Significant Maternal Medications:  None Significant Maternal Lab Results:  Group B Strep  positive and Other:  Number of Prenatal Visits:greater than 3 verified prenatal visits Maternal Vaccinations:TDap Other Comments:  None  Review of Systems  Constitutional:  Negative for chills and fever.  Eyes:  Negative for visual disturbance.  Gastrointestinal:  Negative for abdominal pain, nausea and vomiting.  Genitourinary:  Negative for vaginal bleeding and vaginal discharge.  Neurological:  Negative for headaches.   Maternal Medical History:  Reason for admission: Nausea. IOL for Poly  Contractions: Frequency: rare.   Perceived severity is mild.   Fetal activity: Perceived fetal activity is normal.   Prenatal complications: Polyhydramnios.   No PIH or IUGR.   Prenatal Complications - Diabetes: none.     Height 5\' 2"  (1.575 m), weight 95.9 kg, last menstrual period 11/24/2022. Maternal Exam:  Uterine Assessment: Contraction strength is mild.  Contraction frequency is rare.  Abdomen: Patient reports no abdominal tenderness. Estimated fetal weight is 6lb 9 oz.   Fetal presentation: vertex Introitus: Normal vulva. Normal vagina.  Vagina is negative for discharge.  Pelvis: adequate for delivery.   Cervix: Cervix evaluated by digital exam.     Fetal Exam Fetal Monitor Review: Baseline rate: 125.  Variability: moderate (6-25 bpm).   Pattern: accelerations present and no decelerations.   Fetal State Assessment: Category I - tracings are normal.   Physical Exam Constitutional:      General: She is not in acute distress.    Appearance: She is well-developed. She is not ill-appearing or toxic-appearing.  HENT:     Head: Normocephalic.  Eyes:     Conjunctiva/sclera: Conjunctivae normal.  Cardiovascular:     Rate and Rhythm: Normal rate and regular rhythm.     Heart sounds: Normal heart sounds.  Pulmonary:     Effort: Pulmonary effort is normal. No respiratory distress.     Breath sounds: Normal breath sounds.  Abdominal:     Palpations: Abdomen is soft.      Tenderness: There is no abdominal tenderness.  Genitourinary:    General: Normal vulva.     Vagina: No vaginal discharge or bleeding.  Musculoskeletal:        General: Normal range of motion.     Cervical back: Normal range of motion and neck supple.     Right lower leg: No edema.     Left lower leg: No edema.  Skin:    General: Skin is warm and dry.  Neurological:     Mental Status: She is alert and oriented to person, place, and time.  Psychiatric:        Mood and Affect: Mood normal.    Dilation: 3 Effacement (%): 70 Station: -2  Presentation: Vertex Exam by:: Katrinka Blazing, CNM  Prenatal labs: ABO, Rh: B/Positive/-- (08/14 1113) Antibody: Negative (08/14 1113) Rubella: 2.67 (08/14 1113) RPR: Non Reactive (12/17 0820)  HBsAg: Negative (08/14 1113)  HIV: Non Reactive (12/17 0820)  GBS: Negative/-- (02/19 1035)   [place] 2/28:  AFI Sum(cm)     %Tile       Largest Pocket(cm)  31.66           > 97        10.64 Est. FW: 2974 gm 6 lb 9 oz 33 %   Assessment: 1. Labor: Early/IOL 2. Fetal Wellbeing: Category I  3. Pain Control: Hopes to avoid epidural 4. GBS: neg 5. 38.0 week IUP 6. Severe polyhydramnios unknown etiology. No GDM, Nml [place].   Plan:  1. Admit to BS per consult with MD 2. Routine L&D orders 3. Analgesia/anesthesia PRN  4. Start pitocin.  5. Discussed increased risk of cord prolapse with SROM or AROM which would most likely be indication for emergent C/S if it occurs. Will try to avoid AROM or wait until vtx is well-applied.   Dorathy Kinsman 08/17/2023, 3:46 PM

## 2023-08-18 ENCOUNTER — Inpatient Hospital Stay (HOSPITAL_COMMUNITY): Admitting: Anesthesiology

## 2023-08-18 DIAGNOSIS — Z3A38 38 weeks gestation of pregnancy: Secondary | ICD-10-CM

## 2023-08-18 LAB — RPR: RPR Ser Ql: NONREACTIVE

## 2023-08-18 MED ORDER — EPHEDRINE 5 MG/ML INJ: 10.0000 mg | INTRAVENOUS | Status: DC | PRN

## 2023-08-18 MED ORDER — DIPHENHYDRAMINE HCL 50 MG/ML IJ SOLN
12.5000 mg | INTRAMUSCULAR | Status: DC | PRN
  Administered 2023-08-18: 12.5 mg via INTRAVENOUS
  Filled 2023-08-18: qty 1

## 2023-08-18 MED ORDER — PHENYLEPHRINE 80 MCG/ML (10ML) SYRINGE FOR IV PUSH (FOR BLOOD PRESSURE SUPPORT): 80.0000 ug | PREFILLED_SYRINGE | INTRAVENOUS | Status: DC | PRN

## 2023-08-18 MED ORDER — CALCIUM CARBONATE ANTACID 500 MG PO CHEW
1.0000 | CHEWABLE_TABLET | Freq: Once | ORAL | Status: AC
  Administered 2023-08-18: 200 mg via ORAL
  Filled 2023-08-18: qty 1

## 2023-08-18 MED ORDER — BUPIVACAINE HCL (PF) 0.25 % IJ SOLN
INTRAMUSCULAR | Status: DC | PRN
  Administered 2023-08-18 (×2): 5 mL via EPIDURAL

## 2023-08-18 MED ORDER — LACTATED RINGERS IV SOLN
500.0000 mL | Freq: Once | INTRAVENOUS | Status: AC
  Administered 2023-08-18: 500 mL via INTRAVENOUS

## 2023-08-18 MED ORDER — LIDOCAINE-EPINEPHRINE (PF) 2 %-1:200000 IJ SOLN
INTRAMUSCULAR | Status: DC | PRN
  Administered 2023-08-18: 5 mL via EPIDURAL

## 2023-08-18 MED ORDER — HYDROXYZINE HCL 25 MG PO TABS
50.0000 mg | ORAL_TABLET | Freq: Three times a day (TID) | ORAL | Status: DC | PRN
  Administered 2023-08-18: 50 mg via ORAL
  Filled 2023-08-18: qty 1

## 2023-08-18 MED ORDER — FENTANYL-BUPIVACAINE-NACL 0.5-0.125-0.9 MG/250ML-% EP SOLN
12.0000 mL/h | EPIDURAL | Status: DC | PRN
  Administered 2023-08-18: 12 mL/h via EPIDURAL
  Filled 2023-08-18: qty 250

## 2023-08-18 NOTE — Progress Notes (Signed)
 Barbara Lutz is a 28 y.o. G1P0000 at [redacted]w[redacted]d by ultrasound admitted for induction of labor due to poly.  Subjective: Doing well. Continuing to leak fluid  Objective: BP (!) 128/91   Pulse 76   Temp 98.6 F (37 C) (Oral)   Resp 18   Ht 5\' 2"  (1.575 m)   Wt 95.9 kg   LMP 11/24/2022   BMI 38.66 kg/m  No intake/output data recorded. No intake/output data recorded.  FHT:  FHR: 140 bpm, variability: moderate,  accelerations:  Present,  decelerations:  Absent UC:   regular, every 2-3 minutes SVE:   Dilation: 5.5 Effacement (%): 70 Station: -1 Exam by:: Lindzey Zent MD  Labs: Lab Results  Component Value Date   WBC 10.9 (H) 08/17/2023   HGB 11.4 (L) 08/17/2023   HCT 34.8 (L) 08/17/2023   MCV 88.3 08/17/2023   PLT 199 08/17/2023    Assessment / Plan: Induction of labor due to poly,  progressing well on pitocin  Labor:  IUPC placed. Continue to titrate pitocin Preeclampsia:   n/a Fetal Wellbeing:  Category I Pain Control:   none I/D:   GBS neg Anticipated MOD:  NSVD  Levie Heritage, DO 08/18/2023, 10:46 AM

## 2023-08-18 NOTE — Anesthesia Preprocedure Evaluation (Signed)
 Anesthesia Evaluation  Patient identified by MRN, date of birth, ID band Patient awake    Reviewed: Allergy & Precautions, NPO status , Patient's Chart, lab work & pertinent test results, reviewed documented beta blocker date and time   History of Anesthesia Complications Negative for: history of anesthetic complications  Airway Mallampati: II  TM Distance: >3 FB     Dental no notable dental hx.    Pulmonary neg shortness of breath, neg COPD, former smoker   breath sounds clear to auscultation       Cardiovascular (-) hypertension(-) CAD, (-) Past MI, (-) Cardiac Stents and (-) CABG  Rhythm:Regular Rate:Normal     Neuro/Psych neg Seizures PSYCHIATRIC DISORDERS Anxiety Depression       GI/Hepatic ,neg GERD  ,,(+) neg Cirrhosis        Endo/Other    Renal/GU Renal disease     Musculoskeletal   Abdominal   Peds  Hematology   Anesthesia Other Findings   Reproductive/Obstetrics (+) Pregnancy                             Anesthesia Physical Anesthesia Plan  ASA: 2  Anesthesia Plan: Epidural   Post-op Pain Management:    Induction:   PONV Risk Score and Plan: 1 and Ondansetron  Airway Management Planned:   Additional Equipment:   Intra-op Plan:   Post-operative Plan:   Informed Consent: I have reviewed the patients History and Physical, chart, labs and discussed the procedure including the risks, benefits and alternatives for the proposed anesthesia with the patient or authorized representative who has indicated his/her understanding and acceptance.       Plan Discussed with:   Anesthesia Plan Comments:        Anesthesia Quick Evaluation

## 2023-08-18 NOTE — Anesthesia Procedure Notes (Signed)
 Epidural Patient location during procedure: OB Start time: 08/18/2023 11:51 AM End time: 08/18/2023 12:02 PM  Staffing Anesthesiologist: Mariann Barter, MD Performed: anesthesiologist   Preanesthetic Checklist Completed: patient identified, IV checked, site marked, risks and benefits discussed, surgical consent, monitors and equipment checked, pre-op evaluation and timeout performed  Epidural Patient position: sitting Prep: DuraPrep Patient monitoring: heart rate, continuous pulse ox and blood pressure Approach: midline Location: L4-L5 Injection technique: LOR saline  Needle:  Needle type: Tuohy  Needle gauge: 17 G Needle length: 9 cm Needle insertion depth: 5 cm Catheter type: closed end flexible Catheter size: 19 Gauge Catheter at skin depth: 10 cm Test dose: negative and 1.5% lidocaine with Epi 1:200 K  Assessment Events: blood not aspirated, no cerebrospinal fluid, injection not painful, no injection resistance, no paresthesia and negative IV test  Additional Notes Reason for block:procedure for pain

## 2023-08-18 NOTE — Progress Notes (Signed)
 Labor Progress Note Barbara Lutz is a 28 y.o. G1P0000 at [redacted]w[redacted]d presented for IOL for severe polyhydramnios S: Frustrated and tearful with lack of progress, and 24 hour ROM. Discussed indications for c-section in the setting of reassuring fetal tracing to include arrest of dilation, failure of induction. Patients understanding of prior discussions with the team expressed as, recommendation for primary c-section if cervix unchanged at 2130. Given no cervical change patient would like some additional time to consider this recommendation.   O:  BP (!) 113/59   Pulse 80   Temp 99.4 F (37.4 C) (Axillary)   Resp 18   Ht 5\' 2"  (1.575 m)   Wt 95.9 kg   LMP 11/24/2022   SpO2 100%   BMI 38.66 kg/m   EFM: baseline 135, accels, no decels, moderate variability TOCO: q26min contractions,   CVE: Dilation: 5.5 Effacement (%): 80 Cervical Position: Middle Station: -1 Presentation: Vertex Exam by:: Dr. Leanora Cover   A&P: 28 y.o. G1P0000 [redacted]w[redacted]d admitted for IOL #Labor: No cervical change since 1000. IUPC replaced, due to being in the vagina. MVUs 110. Will given an additional hour or so for consideration of recommendation for primary c-section given reassuring fetal status. Will continue frequent repositioning. Will continue to titrate pitocin towards adequate contractions. #Pain: Epidural #FWB: Cat I #GBS negative   Wyn Forster, MD 10:34 PM

## 2023-08-18 NOTE — Progress Notes (Signed)
 Patient ID: Barbara Lutz, female   DOB: July 08, 1995, 27 y.o.   MRN: [id number]  Initially had adequate MVUs, but now decreasing to 180. Has been on pitocin for about 1 hour. FHT category 1. Cervix with minimal change. Will do pitocin break for 2 hours, then restart.  Levie Heritage, DO

## 2023-08-19 ENCOUNTER — Encounter (HOSPITAL_COMMUNITY): Payer: Self-pay | Admitting: Obstetrics & Gynecology

## 2023-08-19 ENCOUNTER — Other Ambulatory Visit: Payer: Self-pay

## 2023-08-19 ENCOUNTER — Encounter (HOSPITAL_COMMUNITY)
Admission: RE | Disposition: A | Discharge status: Home / Self Care | Payer: Self-pay | Source: Home / Self Care | Attending: Family Medicine

## 2023-08-19 DIAGNOSIS — Z98891 History of uterine scar from previous surgery: Secondary | ICD-10-CM

## 2023-08-19 DIAGNOSIS — Z3A38 38 weeks gestation of pregnancy: Secondary | ICD-10-CM

## 2023-08-19 DIAGNOSIS — O403XX0 Polyhydramnios, third trimester, not applicable or unspecified: Secondary | ICD-10-CM | POA: Diagnosis not present

## 2023-08-19 SURGERY — Surgical Case
Anesthesia: Epidural | Site: Abdomen

## 2023-08-19 MED ORDER — SENNOSIDES-DOCUSATE SODIUM 8.6-50 MG PO TABS
2.0000 | ORAL_TABLET | Freq: Every day | ORAL | Status: DC
  Administered 2023-08-20 – 2023-08-21 (×2): 2 via ORAL
  Filled 2023-08-19 (×2): qty 2

## 2023-08-19 MED ORDER — SIMETHICONE 80 MG PO CHEW
80.0000 mg | CHEWABLE_TABLET | Freq: Three times a day (TID) | ORAL | Status: DC
  Administered 2023-08-19 – 2023-08-20 (×5): 80 mg via ORAL
  Filled 2023-08-19 (×6): qty 1

## 2023-08-19 MED ORDER — TRANEXAMIC ACID-NACL 1000-0.7 MG/100ML-% IV SOLN
INTRAVENOUS | Status: AC
  Filled 2023-08-19: qty 100

## 2023-08-19 MED ORDER — ONDANSETRON HCL 4 MG/2ML IJ SOLN
INTRAMUSCULAR | Status: DC | PRN
  Administered 2023-08-19: 4 mg via INTRAVENOUS

## 2023-08-19 MED ORDER — ACETAMINOPHEN 500 MG PO TABS
1000.0000 mg | ORAL_TABLET | Freq: Four times a day (QID) | ORAL | Status: DC
  Administered 2023-08-19 – 2023-08-21 (×8): 1000 mg via ORAL
  Filled 2023-08-19 (×8): qty 2

## 2023-08-19 MED ORDER — LIDOCAINE-EPINEPHRINE (PF) 2 %-1:200000 IJ SOLN
INTRAMUSCULAR | Status: AC
  Filled 2023-08-19: qty 20

## 2023-08-19 MED ORDER — MAGNESIUM HYDROXIDE 400 MG/5ML PO SUSP: 30.0000 mL | ORAL | Status: DC | PRN

## 2023-08-19 MED ORDER — DIPHENHYDRAMINE HCL 25 MG PO CAPS
25.0000 mg | ORAL_CAPSULE | Freq: Four times a day (QID) | ORAL | Status: DC | PRN
  Administered 2023-08-19: 25 mg via ORAL
  Filled 2023-08-19: qty 1

## 2023-08-19 MED ORDER — SCOPOLAMINE 1 MG/3DAYS TD PT72SCOPOLAMINE 1 MG/3DAYS
MEDICATED_PATCH | TRANSDERMAL | Status: DC | PRN
Start: 2023-08-19 — End: 2023-08-19
  Administered 2023-08-19: 1 via TRANSDERMAL

## 2023-08-19 MED ORDER — KETOROLAC TROMETHAMINE 30 MG/ML IJ SOLN
30.0000 mg | Freq: Once | INTRAMUSCULAR | Status: AC | PRN
  Administered 2023-08-19: 30 mg via INTRAVENOUS

## 2023-08-19 MED ORDER — SOD CITRATE-CITRIC ACID 500-334 MG/5ML PO SOLN
30.0000 mL | ORAL | Status: AC
  Administered 2023-08-19: 30 mL via ORAL

## 2023-08-19 MED ORDER — OXYCODONE HCL 5 MG/5ML PO SOLN: 5.0000 mg | Freq: Once | ORAL | Status: DC | PRN

## 2023-08-19 MED ORDER — OXYTOCIN-SODIUM CHLORIDE 30-0.9 UT/500ML-% IV SOLN
INTRAVENOUS | Status: DC | PRN
  Administered 2023-08-19: 30 mL via INTRAVENOUS

## 2023-08-19 MED ORDER — TETANUS-DIPHTH-ACELL PERTUSSIS 5-2.5-18.5 LF-MCG/0.5 IM SUSY: 0.5000 mL | PREFILLED_SYRINGE | Freq: Once | INTRAMUSCULAR | Status: DC

## 2023-08-19 MED ORDER — OXYCODONE HCL 5 MG PO TABS: 5.0000 mg | ORAL_TABLET | Freq: Once | ORAL | Status: DC | PRN

## 2023-08-19 MED ORDER — NALOXONE HCL 4 MG/10ML IJ SOLN: 1.0000 ug/kg/h | INTRAVENOUS | Status: DC | PRN

## 2023-08-19 MED ORDER — ONDANSETRON HCL 4 MG/2ML IJ SOLN
INTRAMUSCULAR | Status: AC
Start: 2023-08-19 — End: ?
  Filled 2023-08-19: qty 2

## 2023-08-19 MED ORDER — GABAPENTIN 100 MG PO CAPS
200.0000 mg | ORAL_CAPSULE | Freq: Every day | ORAL | Status: DC
  Administered 2023-08-19 – 2023-08-20 (×2): 200 mg via ORAL
  Filled 2023-08-19 (×2): qty 2

## 2023-08-19 MED ORDER — MEPERIDINE HCL 25 MG/ML IJ SOLN: 6.2500 mg | INTRAMUSCULAR | Status: DC | PRN

## 2023-08-19 MED ORDER — PRENATAL MULTIVITAMIN CH
1.0000 | ORAL_TABLET | Freq: Every day | ORAL | Status: DC
  Administered 2023-08-19 – 2023-08-20 (×2): 1 via ORAL
  Filled 2023-08-19 (×2): qty 1

## 2023-08-19 MED ORDER — DIPHENHYDRAMINE HCL 50 MG/ML IJ SOLN: 12.5000 mg | INTRAMUSCULAR | Status: DC | PRN

## 2023-08-19 MED ORDER — TRANEXAMIC ACID-NACL 1000-0.7 MG/100ML-% IV SOLN
1000.0000 mg | Freq: Once | INTRAVENOUS | Status: AC
  Administered 2023-08-19: 1000 mg via INTRAVENOUS

## 2023-08-19 MED ORDER — IBUPROFEN 600 MG PO TABS
600.0000 mg | ORAL_TABLET | Freq: Four times a day (QID) | ORAL | Status: DC
  Administered 2023-08-20 – 2023-08-21 (×5): 600 mg via ORAL
  Filled 2023-08-19 (×5): qty 1

## 2023-08-19 MED ORDER — MENTHOL 3 MG MT LOZG: 1.0000 | LOZENGE | OROMUCOSAL | Status: DC | PRN

## 2023-08-19 MED ORDER — ZOLPIDEM TARTRATE 5 MG PO TABS: 5.0000 mg | ORAL_TABLET | Freq: Every evening | ORAL | Status: DC | PRN

## 2023-08-19 MED ORDER — ONDANSETRON HCL 4 MG/2ML IJ SOLN: 4.0000 mg | Freq: Three times a day (TID) | INTRAMUSCULAR | Status: DC | PRN

## 2023-08-19 MED ORDER — NALOXONE HCL 0.4 MG/ML IJ SOLN: 0.4000 mg | INTRAMUSCULAR | Status: DC | PRN

## 2023-08-19 MED ORDER — KETOROLAC TROMETHAMINE 30 MG/ML IJ SOLN
30.0000 mg | Freq: Four times a day (QID) | INTRAMUSCULAR | Status: AC
  Administered 2023-08-19: 30 mg via INTRAVENOUS
  Filled 2023-08-19 (×2): qty 1

## 2023-08-19 MED ORDER — DEXMEDETOMIDINE HCL IN NACL 80 MCG/20ML IV SOLN
INTRAVENOUS | Status: AC
  Filled 2023-08-19: qty 20

## 2023-08-19 MED ORDER — CITALOPRAM HYDROBROMIDE 20 MG PO TABS
40.0000 mg | ORAL_TABLET | Freq: Every day | ORAL | Status: DC
  Administered 2023-08-19 – 2023-08-21 (×3): 40 mg via ORAL
  Filled 2023-08-19 (×3): qty 2

## 2023-08-19 MED ORDER — SCOPOLAMINE 1 MG/3DAYS TD PT72
MEDICATED_PATCH | TRANSDERMAL | Status: AC
  Filled 2023-08-19: qty 1

## 2023-08-19 MED ORDER — MORPHINE SULFATE (PF) 0.5 MG/ML IJ SOLN
INTRAMUSCULAR | Status: DC | PRN
  Administered 2023-08-19: 3 mg via EPIDURAL

## 2023-08-19 MED ORDER — CEFAZOLIN SODIUM-DEXTROSE 2-4 GM/100ML-% IV SOLN
INTRAVENOUS | Status: AC
  Filled 2023-08-19: qty 100

## 2023-08-19 MED ORDER — INFLUENZA VIRUS VACC SPLIT PF (FLUZONE) 0.5 ML IM SUSY
0.5000 mL | PREFILLED_SYRINGE | INTRAMUSCULAR | Status: DC | PRN
Start: 2023-08-19 — End: 2023-08-21

## 2023-08-19 MED ORDER — SODIUM CHLORIDE 0.9% FLUSH: 3.0000 mL | Freq: Two times a day (BID) | INTRAVENOUS | Status: DC

## 2023-08-19 MED ORDER — COCONUT OIL OIL
1.0000 | TOPICAL_OIL | Status: DC | PRN
  Administered 2023-08-20: 1 via TOPICAL

## 2023-08-19 MED ORDER — MEASLES, MUMPS & RUBELLA VAC IJ SOLR: 0.5000 mL | Freq: Once | INTRAMUSCULAR | Status: DC

## 2023-08-19 MED ORDER — MORPHINE SULFATE (PF) 0.5 MG/ML IJ SOLN
INTRAMUSCULAR | Status: AC
  Filled 2023-08-19: qty 10

## 2023-08-19 MED ORDER — WITCH HAZEL-GLYCERIN EX PADS: 1.0000 | MEDICATED_PAD | CUTANEOUS | Status: DC | PRN

## 2023-08-19 MED ORDER — FENTANYL CITRATE (PF) 100 MCG/2ML IJ SOLN
INTRAMUSCULAR | Status: DC | PRN
  Administered 2023-08-19: 100 ug via INTRAVENOUS

## 2023-08-19 MED ORDER — KETOROLAC TROMETHAMINE 30 MG/ML IJ SOLN
INTRAMUSCULAR | Status: AC
  Filled 2023-08-19: qty 1

## 2023-08-19 MED ORDER — SODIUM CHLORIDE 0.9 % IV SOLN
500.0000 mg | INTRAVENOUS | Status: AC
  Administered 2023-08-19: 500 mg via INTRAVENOUS

## 2023-08-19 MED ORDER — CEFAZOLIN SODIUM-DEXTROSE 2-4 GM/100ML-% IV SOLN
2.0000 g | INTRAVENOUS | Status: AC
  Administered 2023-08-19: 2 g via INTRAVENOUS

## 2023-08-19 MED ORDER — DIBUCAINE (PERIANAL) 1 % EX OINT: 1.0000 | TOPICAL_OINTMENT | CUTANEOUS | Status: DC | PRN

## 2023-08-19 MED ORDER — OXYTOCIN-SODIUM CHLORIDE 30-0.9 UT/500ML-% IV SOLN
INTRAVENOUS | Status: AC
  Filled 2023-08-19: qty 500

## 2023-08-19 MED ORDER — LACTATED RINGERS IV SOLN: INTRAVENOUS | Status: DC | PRN

## 2023-08-19 MED ORDER — PHENYLEPHRINE HCL (PRESSORS) 10 MG/ML IV SOLN
INTRAVENOUS | Status: DC | PRN
  Administered 2023-08-19: 160 ug via INTRAVENOUS

## 2023-08-19 MED ORDER — ENOXAPARIN SODIUM 60 MG/0.6ML IJ SOSY
50.0000 mg | PREFILLED_SYRINGE | INTRAMUSCULAR | Status: DC
  Administered 2023-08-19 – 2023-08-20 (×2): 50 mg via SUBCUTANEOUS
  Filled 2023-08-19 (×2): qty 0.6

## 2023-08-19 MED ORDER — OXYTOCIN-SODIUM CHLORIDE 30-0.9 UT/500ML-% IV SOLN: 2.5000 [IU]/h | INTRAVENOUS | Status: AC

## 2023-08-19 MED ORDER — ACETAMINOPHEN 10 MG/ML IV SOLN
INTRAVENOUS | Status: DC | PRN
  Administered 2023-08-19: 1000 mg via INTRAVENOUS

## 2023-08-19 MED ORDER — STERILE WATER FOR IRRIGATION IR SOLN
Status: DC | PRN
  Administered 2023-08-19: 1000 mL

## 2023-08-19 MED ORDER — ONDANSETRON HCL 4 MG/2ML IJ SOLN: 4.0000 mg | Freq: Once | INTRAMUSCULAR | Status: DC | PRN

## 2023-08-19 MED ORDER — DIPHENHYDRAMINE HCL 25 MG PO CAPS: 25.0000 mg | ORAL_CAPSULE | ORAL | Status: DC | PRN

## 2023-08-19 MED ORDER — SODIUM CHLORIDE 0.9 % IV SOLN: INTRAVENOUS | Status: DC | PRN

## 2023-08-19 MED ORDER — FENTANYL CITRATE (PF) 100 MCG/2ML IJ SOLN
INTRAMUSCULAR | Status: AC
  Filled 2023-08-19: qty 2

## 2023-08-19 MED ORDER — HYDROMORPHONE HCL 1 MG/ML IJ SOLN: 0.2500 mg | INTRAMUSCULAR | Status: DC | PRN

## 2023-08-19 MED ORDER — SODIUM CHLORIDE 0.9% FLUSH: 3.0000 mL | INTRAVENOUS | Status: DC | PRN

## 2023-08-19 MED ORDER — MEDROXYPROGESTERONE ACETATE 150 MG/ML IM SUSP: 150.0000 mg | INTRAMUSCULAR | Status: DC | PRN

## 2023-08-19 MED ORDER — DEXMEDETOMIDINE HCL IN NACL 80 MCG/20ML IV SOLN
INTRAVENOUS | Status: DC | PRN
  Administered 2023-08-19: 8 ug via INTRAVENOUS

## 2023-08-19 MED ORDER — SODIUM CHLORIDE 0.9 % IR SOLN
Status: DC | PRN
  Administered 2023-08-19: 1

## 2023-08-19 MED ORDER — SIMETHICONE 80 MG PO CHEW
80.0000 mg | CHEWABLE_TABLET | ORAL | Status: DC | PRN
  Administered 2023-08-21: 80 mg via ORAL

## 2023-08-19 MED ORDER — PHENYLEPHRINE 80 MCG/ML (10ML) SYRINGE FOR IV PUSH (FOR BLOOD PRESSURE SUPPORT)
PREFILLED_SYRINGE | INTRAVENOUS | Status: AC
  Filled 2023-08-19: qty 10

## 2023-08-19 MED ORDER — OXYCODONE HCL 5 MG PO TABS
5.0000 mg | ORAL_TABLET | ORAL | Status: DC | PRN
  Administered 2023-08-20: 5 mg via ORAL
  Administered 2023-08-20: 10 mg via ORAL
  Administered 2023-08-21: 5 mg via ORAL
  Filled 2023-08-19: qty 2
  Filled 2023-08-19 (×2): qty 1

## 2023-08-19 MED ORDER — SCOPOLAMINE 1 MG/3DAYS TD PT72
1.0000 | MEDICATED_PATCH | Freq: Once | TRANSDERMAL | Status: DC
Start: 2023-08-19 — End: 2023-08-19

## 2023-08-19 MED ORDER — ACETAMINOPHEN 10 MG/ML IV SOLN
INTRAVENOUS | Status: AC
  Filled 2023-08-19: qty 100

## 2023-08-19 MED ORDER — DEXAMETHASONE SODIUM PHOSPHATE 4 MG/ML IJ SOLN
INTRAMUSCULAR | Status: DC | PRN
Start: 2023-08-19 — End: 2023-08-19
  Administered 2023-08-19: 8 mg via INTRAVENOUS

## 2023-08-19 MED ORDER — SODIUM CHLORIDE 0.9% FLUSH
3.0000 mL | INTRAVENOUS | Status: DC | PRN
  Administered 2023-08-21: 3 mL via INTRAVENOUS

## 2023-08-19 MED ORDER — LIDOCAINE-EPINEPHRINE 2 %-1:100000 IJ SOLN
INTRAMUSCULAR | Status: DC | PRN
  Administered 2023-08-19 (×2): 5 mL via INTRADERMAL
  Administered 2023-08-19: 4 mL via INTRADERMAL

## 2023-08-19 MED ORDER — SODIUM CHLORIDE 0.9 % IV SOLN
INTRAVENOUS | Status: AC
  Filled 2023-08-19: qty 5

## 2023-08-19 SURGICAL SUPPLY — 32 items
BARRIER ADHS 3X4 INTERCEED (GAUZE/BANDAGES/DRESSINGS) IMPLANT
BENZOIN TINCTURE PRP APPL 2/3 (GAUZE/BANDAGES/DRESSINGS) IMPLANT
CHLORAPREP W/TINT 26 (MISCELLANEOUS) IMPLANT
CLAMP UMBILICAL CORD (MISCELLANEOUS) IMPLANT
CLOTH BEACON ORANGE TIMEOUT ST (SAFETY) IMPLANT
DRSG OPSITE POSTOP 4X10 (GAUZE/BANDAGES/DRESSINGS) IMPLANT
ELECT REM PT RETURN 9FT ADLT (ELECTROSURGICAL) ×1 IMPLANT
ELECTRODE REM PT RTRN 9FT ADLT (ELECTROSURGICAL) IMPLANT
EXTRACTOR VACUUM KIWI (MISCELLANEOUS) IMPLANT
GLOVE BIO SURGEON STRL SZ 6.5 (GLOVE) IMPLANT
GLOVE BIOGEL PI IND STRL 7.0 (GLOVE) IMPLANT
GOWN STRL REUS W/TWL LRG LVL3 (GOWN DISPOSABLE) IMPLANT
KIT ABG SYR 3ML LUER SLIP (SYRINGE) IMPLANT
MAT PREVALON FULL STRYKER (MISCELLANEOUS) IMPLANT
NDL HYPO 22X1.5 SAFETY MO (MISCELLANEOUS) IMPLANT
NDL HYPO 25X5/8 SAFETYGLIDE (NEEDLE) IMPLANT
NEEDLE HYPO 22X1.5 SAFETY MO (MISCELLANEOUS) ×1 IMPLANT
NEEDLE HYPO 25X5/8 SAFETYGLIDE (NEEDLE) ×1 IMPLANT
NS IRRIG 1000ML POUR BTL (IV SOLUTION) IMPLANT
PACK C SECTION WH (CUSTOM PROCEDURE TRAY) IMPLANT
PAD OB MATERNITY 11 LF (PERSONAL CARE ITEMS) IMPLANT
RETRACTOR WND ALEXIS 25 LRG (MISCELLANEOUS) IMPLANT
RTRCTR WOUND ALEXIS 25CM LRG (MISCELLANEOUS) ×1 IMPLANT
STRIP CLOSURE SKIN 1/2X4 (GAUZE/BANDAGES/DRESSINGS) IMPLANT
SUT VIC AB 0 CT1 36 (SUTURE) IMPLANT
SUT VIC AB 2-0 CT1 TAPERPNT 27 (SUTURE) IMPLANT
SUT VIC AB 4-0 PS2 27 (SUTURE) IMPLANT
SUTURE PLAIN GUT 2.0 ETHICON (SUTURE) IMPLANT
SYR CONTROL 10ML LL (SYRINGE) IMPLANT
TOWEL OR 17X24 6PK STRL BLUE (TOWEL DISPOSABLE) IMPLANT
TRAY FOLEY W/BAG SLVR 14FR LF (SET/KITS/TRAYS/PACK) IMPLANT
WATER STERILE IRR 1000ML POUR (IV SOLUTION) IMPLANT

## 2023-08-19 NOTE — Progress Notes (Signed)
 Epidural not present at assessment on MBU.  Site WDL.  Unknown of exact time or PACU staff who removed the catheter.

## 2023-08-19 NOTE — Lactation Note (Signed)
 This note was copied from a baby's chart. Lactation Consultation Note  Patient Name: Barbara Lutz [id number] Date: 08/19/2023 Age:28 hours  Reason for consult: Initial assessment;Mother's request;Primapara;1st time breastfeeding;Early term 37-38.6wks  P1, [redacted]w[redacted]d, C/S  Initial LC visit to see P1 mother and [name]. Baby is sleeping in his crib. Gentle stimulation and baby brought to the breast. Baby licked expressed colostrum then pulled back and was not interested in feeding at this time. Baby was fed 1 ml of colostrum by spoon.   Basic breastfeeding education regarding positioning in football hold, alignment to the breast and expressing colostrum prior to latch was reviewed. Mother reported that he did feed well after delivery and for 10 minutes at 1000.   Mother encouraged to latch baby with feeding cues, place baby skin to skin if not latching, and call for assistance with breastfeeding as needed. If baby is not showing feeding cues, discussed setting the DEBP for mother to stimulate her milk production.   Mom made aware of O/P services, breastfeeding support groups, community resources, and our phone # for post-discharge questions.    Maternal Data Has patient been taught Hand Expression?: Yes  Feeding Mother's Current Feeding Choice: Breast Milk  LATCH Score Latch: Too sleepy or reluctant, no latch achieved, no sucking elicited.  Audible Swallowing: None  Type of Nipple: Everted at rest and after stimulation  Comfort (Breast/Nipple): Soft / non-tender  Hold (Positioning): Assistance needed to correctly position infant at breast and maintain latch.  LATCH Score: 5     Interventions Interventions: Breast feeding basics reviewed;Assisted with latch;Hand express;Support pillows;Education;LC Services brochure;CDC milk storage guidelines;CDC Guidelines for Breast Pump Cleaning  Discharge Pump: DEBP;Personal  Consult Status Consult Status: Follow-up Date:  08/20/23 Follow-up type: In-patient    Christella Hartigan M 08/19/2023, 3:49 PM

## 2023-08-19 NOTE — Op Note (Signed)
 [facility] PROCEDURE DATE: 08/19/2023  PREOPERATIVE DIAGNOSES: Intrauterine pregnancy at [redacted]w[redacted]d weeks gestation; failure to progress: arrest of dilation  POSTOPERATIVE DIAGNOSES: The same  PROCEDURE: Primary Low Transverse Cesarean Section  SURGEON:  Dr. Scheryl Darter  ASSISTANT:  Dr. Wyn Forster An experienced assistant was required given the standard of surgical care given the complexity of the case.  This assistant was needed for exposure, dissection, suctioning, retraction, instrument exchange, and for overall help during the procedure.   ANESTHESIOLOGY TEAM: Anesthesiologist: Linton Rump, MD; Mariann Barter, MD; Trevor Iha, MD  INDICATIONS: Barbara Lutz is a 28 y.o. G1P1001 at [redacted]w[redacted]d here for cesarean section secondary to the indications listed under preoperative diagnoses; please see preoperative note for further details.  The risks of surgery were discussed with the patient including but were not limited to: bleeding which may require transfusion or reoperation; infection which may require antibiotics; injury to bowel, bladder, ureters or other surrounding organs; injury to the fetus; need for additional procedures including hysterectomy in the event of a life-threatening hemorrhage; formation of adhesions; placental abnormalities wth subsequent pregnancies; incisional problems; thromboembolic phenomenon and other postoperative/anesthesia complications.  The patient concurred with the proposed plan, giving informed written consent for the procedure.    FINDINGS:  Viable female infant in cephalic presentation.  Apgars 8 and 9.  Amniotic fluid: clear.  Intact placenta, three vessel cord.  Normal uterus, fallopian tubes and ovaries bilaterally.  ANESTHESIA: epidural INTRAVENOUS FLUIDS: 1000 ml   ESTIMATED BLOOD LOSS: 559 ml URINE OUTPUT:  100 ml SPECIMENS: Placenta sent to L&D  COMPLICATIONS: None immediate  PROCEDURE IN DETAIL:  The patient preoperatively  received intravenous antibiotics and had sequential compression devices applied to her lower extremities.  She was then taken to the operating room where the epidural was dosed up to a surgical level and found to be adequate. She was then placed in a dorsal supine position with a leftward tilt, and prepped and draped in a sterile manner.  A foley catheter was  was already in place.  After an adequate timeout was performed, a Pfannenstiel skin incision was made with scalpel and carried through to the underlying layer of fascia. The fascia was incised in the midline, and this incision was extended bluntly. The rectus muscles were separated in the midline and the peritoneum was entered sharply with Metzenbaum scissors.   The Alexis self-retaining retractor was introduced into the abdominal cavity.  Attention was turned to the lower uterine segment where a low transverse hysterotomy was made with a scalpel and extended bilaterally bluntly.  The infant was successfully delivered, the cord was clamped and cut after one minute, and the infant was handed over to the awaiting neonatology team. Uterine massage was then administered, and the placenta delivered intact with a three-vessel cord. The uterus was then cleared of clots and debris.  The hysterotomy was closed with 0 Vicryl in a running fashion. Figure-of-eight 0 Vicryl serosal stitches were placed to help with hemostasis.    The pelvis was cleared of all clot and debris. Hemostasis was confirmed on all surfaces.  The retractor was removed.  The peritoneum was closed with a 2-0 Vicryl running stitch. The fascia was then closed using 0 Vicryl in a running fashion.  The subcutaneous layer was irrigated, and was found to be hemostatic, any areas of bleeding were cauterized with the bovie, and was reapproximated with 2-0 plain gut in a running fashion. The skin was closed with a 4-0 monocryl subcuticular stitch.  The patient tolerated the procedure well. Sponge,  instrument and needle counts were correct x 3.  She was taken to the recovery room in stable condition.    Wyn Forster, MD FMOB Fellow, Faculty practice [facility], [facility]

## 2023-08-19 NOTE — Anesthesia Postprocedure Evaluation (Signed)
 Anesthesia Post Note  Patient: Nylia Gavina  Procedure(s) Performed: CESAREAN DELIVERY (Abdomen)     Patient location during evaluation: Mother Baby Anesthesia Type: Epidural Level of consciousness: oriented and awake and alert Pain management: pain level controlled Vital Signs Assessment: post-procedure vital signs reviewed and stable Respiratory status: spontaneous breathing and respiratory function stable Cardiovascular status: blood pressure returned to baseline and stable Postop Assessment: no headache, no backache, no apparent nausea or vomiting and able to ambulate Anesthetic complications: no  No notable events documented.  Last Vitals:  Vitals:   08/19/23 1430 08/19/23 1800  BP: 111/68 121/75  Pulse: 65 67  Resp: 18 18  Temp: 36.7 C 37.1 C  SpO2: 98% 99%    Last Pain:  Vitals:   08/19/23 1800  TempSrc: Oral  PainSc:    Pain Goal:                   Trevor Iha

## 2023-08-19 NOTE — Progress Notes (Signed)
 LABOR PROGRESS NOTE  Patient Name: Barbara Lutz, female   DOB: 1995-08-23, 28 y.o.  MRN: [id number]  The risks of cesarean section were discussed with the patient; including but not limited to: infection which may require antibiotics; bleeding which may require transfusion or re-operation; injury to bowel, bladder, ureters or other surrounding organs; injury to the fetus; need for additional procedures including hysterectomy in the event of a life-threatening hemorrhage; placental abnormalities wth subsequent pregnancies,  risk of needing c-sections in future pregnancies, incisional problems, thromboembolic phenomenon and other postoperative/anesthesia complications. Answered all questions. The patient verbalized understanding of the plan, giving informed consent for the procedure. She is agreeable to blood transfusion in the event of emergency.  Patient has been NPO since 3/2, she will remain NPO for procedure Anesthesia and OR aware Preoperative prophylactic antibiotics and SCDs ordered on call to the OR  To OR when ready  Wyn Forster, MD

## 2023-08-19 NOTE — Transfer of Care (Signed)
 Immediate Anesthesia Transfer of Care Note  Patient: Barbara Lutz  Procedure(s) Performed: CESAREAN SECTION (Abdomen)  Patient Location: PACU  Anesthesia Type:Epidural  Level of Consciousness: awake, alert , and oriented  Airway & Oxygen Therapy: Patient Spontanous Breathing  Post-op Assessment: Report given to RN and Post -op Vital signs reviewed and stable  Post vital signs: Reviewed and stable  Last Vitals:  Vitals Value Taken Time  BP    Temp    Pulse 84 08/19/23 0554  Resp 18 08/19/23 0554  SpO2 97 % 08/19/23 0554  Vitals shown include unfiled device data.  Last Pain:  Vitals:   08/19/23 0400  TempSrc:   PainSc: 0-No pain         Complications: No notable events documented.

## 2023-08-19 NOTE — Lactation Note (Signed)
 This note was copied from a baby's chart. Lactation Consultation Note  Patient Name: Boy Vennesa Bastedo [id number] Date: 08/19/2023 Age:28 hours  Reason for consult: Follow-up assessment;Primapara;1st time breastfeeding;Breastfeeding assistance  Returned to room to see if breastfeeding is progressing. Mother reports she has tried to latch him after his bath but he was sleepy. Mother was receptive to breastfeeding help. Baby was removed from his swaddle and  became more alert. Assisted mother with latching baby in football and cross cradle hold. Infant is rooting and latching briefly. Mother is demonstrating comfort with holding baby and getting him to latch. She is able to hand express colostrum.   Mother encouraged to latch baby with feeding cues, place baby skin to skin if not latching, and call for assistance with breastfeeding as needed. Anticipate as baby approaches 24 hours of age, baby will breastfeed more often 8-12 plus times in 24 hours and may "cluster feed".      LATCH Score Latch: Repeated attempts needed to sustain latch, nipple held in mouth throughout feeding, stimulation needed to elicit sucking reflex.  Audible Swallowing: A few with stimulation  Type of Nipple: Everted at rest and after stimulation  Comfort (Breast/Nipple): Soft / non-tender  Hold (Positioning): Assistance needed to correctly position infant at breast and maintain latch.  LATCH Score: 7      Interventions Interventions: Breast feeding basics reviewed;Assisted with latch;Adjust position;Support pillows;Education    Christella Hartigan M 08/19/2023, 7:04 PM

## 2023-08-19 NOTE — Discharge Summary (Signed)
 Postpartum Discharge Summary  Date of Service updated***     Patient Name: Barbara Lutz DOB: May 14, 1996 MRN: [id number]  Date of admission: 08/17/2023 Delivery date:08/19/2023 Delivering provider: Adam Phenix Date of discharge: 08/19/2023  Admitting diagnosis: Pregnancy [Z34.90] Intrauterine pregnancy: [redacted]w[redacted]d     Secondary diagnosis:  Active Problems:   Polyhydramnios affecting pregnancy   S/P primary low transverse C-section  Additional problems: Prolonged rupture of membranes    Discharge diagnosis: Term Pregnancy Delivered                                              Post partum procedures:{Postpartum procedures:23558} Augmentation: AROM and Pitocin Complications: ROM>24 hours  Hospital course: Induction of Labor With Cesarean Section   28 y.o. yo G1P1001 at [redacted]w[redacted]d was admitted to the hospital 08/17/2023 for induction of labor. Patient had a labor course significant for arrest of dilation at 5.5 cm. The patient went for cesarean section due to Arrest of Dilation. Delivery details are as follows: Membrane Rupture Time/Date: 9:16 PM,08/17/2023  Delivery Method:C-Section, Low Transverse Operative Delivery:N/A Details of operation can be found in separate operative Note.  Patient had a postpartum course complicated by***. She is ambulating, tolerating a regular diet, passing flatus, and urinating well.  Patient is discharged home in stable condition on 08/19/23.      Newborn Data: Birth date:08/19/2023 Birth time:4:42 AM Gender:Female Living status:Living Apgars:8 ,9  Weight:3270 g                               Magnesium Sulfate received: {Mag received:30440022} BMZ received: No Rhophylac:N/A MMR:Yes T-DaP:Given prenatally Flu: No RSV Vaccine received: No Transfusion:{Transfusion received:30440034}  Immunizations received: Immunization History  Administered Date(s) Administered   DTaP 03/19/1996, 05/17/1996, 08/03/1996, 08/19/1997, 09/25/2000   DTaP / HiB 03/19/1996,  05/17/1996, 08/03/1996, 02/01/1997   HIB, Unspecified 03/19/1996, 05/17/1996, 08/03/1996, 02/01/1997   HPV Quadrivalent 03/30/2009, 03/07/2010, 07/03/2010   Hepatitis A, Ped/Adol-2 Dose 03/10/2008, 03/30/2009   Hepatitis B, PED/ADOLESCENT 09-25-1995, 03/19/1996, 11/25/1996   IPV 03/19/1996, 05/17/1996, 08/19/1997, 09/25/2000   Influenza Inj Mdck Quad Pf 02/15/2018   Influenza, Seasonal, Injecte, Preservative Fre 04/19/2016   Influenza,inj,Quad PF,6+ Mos 04/19/2016, 04/19/2016, 03/18/2017, 03/22/2019, 03/06/2022   Influenza,inj,quad, With Preservative 03/10/2008, 03/30/2009, 03/07/2010   Influenza-Unspecified 03/10/2008, 03/30/2009, 03/07/2010, 04/19/2016, 03/18/2017, 03/22/2019, 03/12/2021   MMR 02/01/1997, 09/25/2000   Meningococcal B, Unspecified 03/10/2008   Meningococcal Conjugate 03/10/2008, 02/14/2014   Moderna Sars-Covid-2 Vaccination 02/05/2020, 03/04/2020   Pneumococcal-Unspecified 07/09/1999   Td 02/06/2007, 03/18/2017   Tdap 02/06/2007, 07/03/2023   Varicella 02/01/1997, 03/10/2008    Physical exam  Vitals:   08/19/23 0310 08/19/23 0320 08/19/23 0325 08/19/23 0330  BP:      Pulse:      Resp:      Temp:      TempSrc:      SpO2: 99% 100% 99% 99%  Weight:      Height:       General: {Exam; general:21111117} Lochia: {Desc; appropriate/inappropriate:30686::"appropriate"} Uterine Fundus: {Desc; firm/soft:30687} Incision: {Exam; incision:21111123} DVT Evaluation: {Exam; dvt:2111122} Labs: Lab Results  Component Value Date   WBC 10.9 (H) 08/17/2023   HGB 11.4 (L) 08/17/2023   HCT 34.8 (L) 08/17/2023   MCV 88.3 08/17/2023   PLT 199 08/17/2023      Latest Ref Rng & Units 08/17/2023  3:40 PM  CMP  Glucose 70 - 99 mg/dL 64   BUN 6 - 20 mg/dL 6   Creatinine 1.61 - 0.96 mg/dL 0.45   Sodium 409 - 811 mmol/L 135   Potassium 3.5 - 5.1 mmol/L 3.6   Chloride 98 - 111 mmol/L 106   CO2 22 - 32 mmol/L 22   Calcium 8.9 - 10.3 mg/dL 8.7   Total Protein 6.5 - 8.1 g/dL 6.9    Total Bilirubin 0.0 - 1.2 mg/dL 0.4   Alkaline Phos 38 - 126 U/L 182   AST 15 - 41 U/L 26   ALT 0 - 44 U/L 19    Edinburgh Score:     No data to display         No data recorded  After visit meds:  Allergies as of 08/19/2023       Reactions   Latex Rash   More adhesive than latex- causes redness   Nickel Dermatitis     Med Rec must be completed prior to using this [facility]*        Discharge home in stable condition Infant Feeding: Breast Infant Disposition:home with mother Discharge instruction: per After Visit Summary and Postpartum booklet. Activity: Advance as tolerated. Pelvic rest for 6 weeks.  Diet: routine diet Future Appointments: Future Appointments  Date Time Provider Department Center  08/21/2023  8:15 AM Levie Heritage, DO [facility] None  08/27/2023  8:15 AM Levie Heritage, DO [facility] None  09/08/2023 11:00 AM Bauert, Rella Larve, LCSW [facility] None  10/10/2023  4:00 PM Bauert, Rella Larve, LCSW [facility] None   Follow up Visit: Message sent to [facility]  Please schedule this patient for a In person postpartum visit in 1 week with the following provider: Any provider. Additional Postpartum F/U:Postpartum Depression checkup, Incision check 1 week, and BP check 1 week  Low risk pregnancy complicated by:  Polyhydramnios, BMI 30s Delivery mode:  C-Section, Low Transverse Anticipated Birth Control:  Unsure   08/19/2023 Wyn Forster, MD

## 2023-08-20 LAB — CBC
HCT: 26 % — ABNORMAL LOW (ref 36.0–46.0)
Hemoglobin: 8.6 g/dL — ABNORMAL LOW (ref 12.0–15.0)
MCH: 29.3 pg (ref 26.0–34.0)
MCHC: 33.1 g/dL (ref 30.0–36.0)
MCV: 88.4 fL (ref 80.0–100.0)
Platelets: 159 10*3/uL (ref 150–400)
RBC: 2.94 MIL/uL — ABNORMAL LOW (ref 3.87–5.11)
RDW: 13.7 % (ref 11.5–15.5)
WBC: 14.3 10*3/uL — ABNORMAL HIGH (ref 4.0–10.5)
nRBC: 0 % (ref 0.0–0.2)

## 2023-08-20 LAB — BIRTH TISSUE RECOVERY COLLECTION (PLACENTA DONATION)

## 2023-08-20 MED ORDER — IRON SUCROSE 500 MG IVPB - SIMPLE MED
500.0000 mg | Freq: Once | INTRAVENOUS | Status: DC
  Filled 2023-08-20: qty 275

## 2023-08-20 MED ORDER — SODIUM CHLORIDE 0.9 % IV SOLN
500.0000 mg | Freq: Once | INTRAVENOUS | Status: AC
  Administered 2023-08-20: 500 mg via INTRAVENOUS
  Filled 2023-08-20: qty 25

## 2023-08-20 NOTE — Lactation Note (Signed)
 This note was copied from a baby's chart. Lactation Consultation Note  Patient Name: Barbara Lutz [id number] Date: 08/20/2023 Age:28 hours Reason for consult: Follow-up assessment;Mother's request;Primapara;1st time breastfeeding;Early term 37-38.6wks;Maternal endocrine disorder  P1- MOB requested a consult with LC because she was concerned that infant was not getting enough. LC reviewed infant's 3.36% weight loss, x2 pee and x2 stools and reassured MOB that it seems like infant is getting enough. MOB confirms that infant is nursing very well and does not cause pain/pinching to MOB's nipples. MOB states that infant recently started acting hungry more often and that made her feel worried about how much he is taking in. [facility] reviewed cluster feeding and how this is normal at his age of life. LC encouraged MOB to allow infant to cluster feed, for both his benefit and hers. MOB thanked [facility] and denied having further questions or concerns. LC encouraged MOB to call for further assistance as needed.  Maternal Data Has patient been taught Hand Expression?: Yes Does the patient have breastfeeding experience prior to this delivery?: No  Feeding Mother's Current Feeding Choice: Breast Milk  Lactation Tools Discussed/Used Pump Education: Milk Storage  Interventions Interventions: Breast feeding basics reviewed;Education;LC Services brochure  Discharge Discharge Education: Engorgement and breast care;Warning signs for feeding baby Pump: DEBP;Personal  Consult Status Consult Status: Follow-up Date: 08/21/23 Follow-up type: In-patient    Dema Severin BS, IBCLC 08/20/2023, 8:12 PM

## 2023-08-20 NOTE — Progress Notes (Signed)
 MOB was referred for history of depression/anxiety.  * Referral screened out by Clinical Social Worker because none of the following criteria appear to apply:  ~ History of anxiety/depression during this pregnancy, or of post-partum depression following prior delivery.  ~ Diagnosis of anxiety and/or depression within last 3 years  OR  * MOB's symptoms currently being treated with medication and/or therapy.  Per OB notes, MOB is prescribed Celexa 20mg  for support.  Please contact the Clinical Social Worker if needs arise, by [facility] request, or if MOB scores greater than 9/yes to question 10 on Edinburgh Postpartum Depression Screen.  Enos Fling, Theresia Majors Clinical Social Worker [phone]

## 2023-08-20 NOTE — Progress Notes (Signed)
 POSTPARTUM PROGRESS NOTE  POD #1  Subjective:  Barbara Lutz is a 28 y.o. G1P1001 s/p pLTCS at [redacted]w[redacted]d. No acute events overnight. She reports she is doing well. She denies any problems with ambulating, voiding or po intake. Denies nausea or vomiting. She has passed flatus. Pain is well controlled.  Lochia is appropriate.  Objective: Blood pressure 120/81, pulse 81, temperature 97.7 F (36.5 C), temperature source Oral, resp. rate 16, height 5\' 2"  (1.575 m), weight 95.9 kg, last menstrual period 11/24/2022, SpO2 100%, unknown if currently breastfeeding.  Physical Exam:  General: alert, cooperative and no distress Chest: no respiratory distress Heart: regular rate, distal pulses intact Uterine Fundus: firm, appropriately tender DVT Evaluation: No calf swelling or tenderness Extremities: trace edema Skin: warm, dry; incision clean/dry/intact w/ honeycomb dressing in place  Recent Labs    08/17/23 1540 08/20/23 0511  HGB 11.4* 8.6*  HCT 34.8* 26.0*    Assessment/Plan: Barbara Lutz is a 28 y.o. G1P1001 s/p pLTCS at [redacted]w[redacted]d for AoD.  POD#1 - Doing welll; pain well controlled. H/H appropriate  Routine postpartum care  OOB, ambulated  Lovenox for VTE prophylaxis Acut blood loss Anemia, clinically significant: asymptomatic  Ordered IV venofer 500mg  after verbal consent from patient Contraception: POPs Feeding: breast  Dispo: Plan for discharge 3/6 vs 3/7.   LOS: 3 days   Hessie Dibble, MD OB Fellow  08/20/2023, 9:48 AM

## 2023-08-21 ENCOUNTER — Encounter: Payer: Commercial Managed Care - PPO | Admitting: Family Medicine

## 2023-08-21 ENCOUNTER — Encounter (HOSPITAL_COMMUNITY): Payer: Self-pay | Admitting: Obstetrics & Gynecology

## 2023-08-21 ENCOUNTER — Other Ambulatory Visit (HOSPITAL_BASED_OUTPATIENT_CLINIC_OR_DEPARTMENT_OTHER): Payer: Self-pay

## 2023-08-21 MED ORDER — POLYETHYLENE GLYCOL 3350 17 G PO PACK
17.0000 g | PACK | Freq: Every day | ORAL | Status: DC
  Administered 2023-08-21: 17 g via ORAL
  Filled 2023-08-21: qty 1

## 2023-08-21 MED ORDER — SIMETHICONE 80 MG PO CHEW
80.0000 mg | CHEWABLE_TABLET | Freq: Three times a day (TID) | ORAL | 0 refills | Status: DC
  Filled 2023-08-21: qty 100, 30d supply, fill #0

## 2023-08-21 MED ORDER — OXYCODONE HCL 5 MG PO TABS
5.0000 mg | ORAL_TABLET | ORAL | 0 refills | Status: DC | PRN
Start: 2023-08-21 — End: 2023-10-01
  Filled 2023-08-21: qty 30, 3d supply, fill #0

## 2023-08-21 MED ORDER — SENNOSIDES-DOCUSATE SODIUM 8.6-50 MG PO TABS
2.0000 | ORAL_TABLET | Freq: Every day | ORAL | 0 refills | Status: DC
  Filled 2023-08-21: qty 100, 50d supply, fill #0

## 2023-08-21 MED ORDER — POLYETHYLENE GLYCOL 3350 17 G PO PACK
17.0000 g | PACK | Freq: Every day | ORAL | 0 refills | Status: DC | PRN
  Filled 2023-08-21: qty 14, 14d supply, fill #0

## 2023-08-21 MED ORDER — IBUPROFEN 600 MG PO TABS
600.0000 mg | ORAL_TABLET | Freq: Four times a day (QID) | ORAL | 0 refills | Status: DC | PRN
  Filled 2023-08-21: qty 30, 8d supply, fill #0

## 2023-08-21 NOTE — Lactation Note (Signed)
 This note was copied from a baby's chart. Lactation Consultation Note  Patient Name: Barbara Lutz Date: 08/21/2023 Age:28 hours Reason for consult: Follow-up assessment;Primapara;1st time breastfeeding;Early term 37-38.6wks;Infant weight loss (6 % weight loss) LC reviewed BF D/C teaching and the [facility] resources. See below for details.   Maternal Data Has patient been taught Hand Expression?: Yes  Feeding Mother's Current Feeding Choice: Breast Milk  LATCH Score - LC unable to assess LATCH, recently fed    Lactation Tools Discussed/Used Tools: Pump;Flanges Flange Size: 21;18;24 Breast pump type: Manual Pump Education: Setup, frequency, and cleaning;Milk Storage Reason for Pumping: PRN  Interventions Interventions: Breast feeding basics reviewed;Hand pump;Education;LC Services brochure;CDC milk storage guidelines;CDC Guidelines for Breast Pump Cleaning  Discharge Discharge Education: Engorgement and breast care;Warning signs for feeding baby;Other (comment) (per mom has a LC O/P made by the [facility]  from the [facility]) Pump: DEBP;Personal;Manual  Consult Status Consult Status: Complete Date: 08/21/23    Kathrin Greathouse 08/21/2023, 12:36 PM

## 2023-08-21 NOTE — Discharge Instructions (Signed)
 Your appointment with Outpatient Lactation is: Thursday, March 13 at [facility] (First Floor) [address] [place]  Check in under baby's name.  Please bring your baby hungry along with your pump and a bottle of either formula or expressed breast milk. Please also bring your pump flanges and we welcome support people! If you need lactation assistance before your appointment, please call [phone] and press 4 for lactation.

## 2023-08-27 ENCOUNTER — Ambulatory Visit: Payer: Commercial Managed Care - PPO

## 2023-08-29 ENCOUNTER — Telehealth (HOSPITAL_COMMUNITY): Payer: Self-pay | Admitting: *Deleted

## 2023-08-29 NOTE — Telephone Encounter (Signed)
 08/29/2023  Name: Fiana Gladu MRN: [id number] DOB: 27-Jan-1996  Reason for Call:  Transition of Care Hospital Discharge Call  Contact Status: Patient Contact Status: Message  Language assistant needed:          Follow-Up Questions:    Inocente Salles Postnatal Depression Scale:  In the Past 7 Days:    PHQ2-9 Depression Scale:     Discharge Follow-up:    Post-discharge interventions: NA  Salena Saner, RN 08/29/2023

## 2023-09-03 ENCOUNTER — Other Ambulatory Visit (HOSPITAL_BASED_OUTPATIENT_CLINIC_OR_DEPARTMENT_OTHER): Payer: Self-pay

## 2023-09-08 ENCOUNTER — Ambulatory Visit: Payer: Commercial Managed Care - PPO | Admitting: Psychology

## 2023-09-17 ENCOUNTER — Ambulatory Visit: Admitting: Psychology

## 2023-09-18 ENCOUNTER — Telehealth: Payer: Self-pay

## 2023-09-18 NOTE — Telephone Encounter (Signed)
 Patient called in stating that her scar is healing well and wanted to know if she was able to wear a binder for her stomach or if she needs to wait.   Routing question to Dr. Adrian Blackwater.

## 2023-10-01 ENCOUNTER — Other Ambulatory Visit (HOSPITAL_BASED_OUTPATIENT_CLINIC_OR_DEPARTMENT_OTHER): Payer: Self-pay

## 2023-10-01 ENCOUNTER — Ambulatory Visit: Admitting: Family Medicine

## 2023-10-01 DIAGNOSIS — F411 Generalized anxiety disorder: Secondary | ICD-10-CM

## 2023-10-01 MED ORDER — BUSPIRONE HCL 5 MG PO TABS
5.0000 mg | ORAL_TABLET | Freq: Two times a day (BID) | ORAL | 3 refills | Status: DC
Start: 2023-10-01 — End: 2024-03-04
  Filled 2023-10-01: qty 60, 30d supply, fill #0

## 2023-10-01 NOTE — Progress Notes (Signed)
 Post Partum Visit Note  Barbara Lutz is a 28 y.o. G14P1001 female who presents for a postpartum visit. She is 6 weeks postpartum following a primary cesarean section.  I have fully reviewed the prenatal and intrapartum course. The delivery was at 38 gestational weeks.  Anesthesia: epidural. Postpartum course has been normal. Baby is doing well. Baby is feeding by bottle - Enfamil Gentle Ease . Bleeding no bleeding. Bowel function is normal. Bladder function is normal. Patient is not sexually active. Contraception method is none. Postpartum depression screening: negative. Does have episodes of increased anxiousness. On celexa 40mg  daily.   The pregnancy intention screening data noted above was reviewed. Potential methods of contraception were discussed. The patient elected to proceed with No data recorded.   Edinburgh Postnatal Depression Scale - 10/01/23 0918       Edinburgh Postnatal Depression Scale:  In the Past 7 Days   I have been able to laugh and see the funny side of things. 0    I have looked forward with enjoyment to things. 0    I have blamed myself unnecessarily when things went wrong. 1    I have been anxious or worried for no good reason. 1    I have felt scared or panicky for no good reason. 1    Things have been getting on top of me. 1    I have been so unhappy that I have had difficulty sleeping. 0    I have felt sad or miserable. 0    I have been so unhappy that I have been crying. 0    The thought of harming myself has occurred to me. 0    Edinburgh Postnatal Depression Scale Total 4             There are no preventive care reminders to display for this patient.  The following portions of the patient's history were reviewed and updated as appropriate: allergies, current medications, past family history, past medical history, past social history, past surgical history, and problem list.  Review of Systems Pertinent items are noted in  HPI.  Objective:  BP 110/73 (BP Location: Left Arm, Patient Position: Sitting, Cuff Size: Large)   Pulse 64   Wt 187 lb (84.8 kg)   LMP 11/24/2022   Breastfeeding No   BMI 34.20 kg/m    General:  alert, cooperative, and no distress   Breasts:  not indicated  Lungs: clear to auscultation bilaterally  Heart:  regular rate and rhythm, S1, S2 normal, no murmur, click, rub or gallop  Abdomen: soft, non-tender; bowel sounds normal; no masses,  no organomegaly   Wound well approximated incision  GU exam:  not indicated       Assessment:   1. Postpartum care and examination (Primary)  2. GAD (generalized anxiety disorder) Add buspar   Plan:   Essential components of care per ACOG recommendations:  1.  Mood and well being: Patient with negative depression screening today. Reviewed local resources for support.  - Patient tobacco use? No.   - hx of drug use? No.    2. Infant care and feeding:  -Patient currently breastmilk feeding? No.  -Social determinants of health (SDOH) reviewed in EPIC. No concerns  3. Sexuality, contraception and birth spacing - Patient does not want a pregnancy in the next year.  - Reviewed reproductive life planning. Reviewed contraceptive methods based on pt preferences and effectiveness.  Patient desired Abstinence today.   - Discussed birth  spacing of 18 months  4. Sleep and fatigue -Encouraged family/partner/community support of 4 hrs of uninterrupted sleep to help with mood and fatigue  5. Physical Recovery  - Discussed patients delivery and complications. She describes her labor as good. - Patient had a C-section.  - Patient has urinary incontinence? No. - Patient is safe to resume physical and sexual activity  6.  Health Maintenance - HM due items addressed Yes - Last pap smear  Diagnosis  Date Value Ref Range Status  01/29/2023   Final   - Negative for intraepithelial lesion or malignancy (NILM)   Pap smear not done at today's visit.   -Breast Cancer screening indicated? No.   7. Chronic Disease/Pregnancy Condition follow up: None  - PCP follow up  Ahmaud Duthie J Nolyn Eilert, DO [facility], [facility]

## 2023-10-10 ENCOUNTER — Ambulatory Visit: Payer: Commercial Managed Care - PPO | Admitting: Psychology

## 2023-10-10 NOTE — Progress Notes (Deleted)
 [facility] Counselor/Therapist Progress Note  Patient ID: Barbara Lutz, MRN: [id number],    Date: 10/10/2023  Time Spent: 4:00pm-4:50pm   50 minutes   Treatment Type: Individual Therapy  Reported Symptoms: stress  Mental Status Exam: Appearance:  Casual     Behavior: Appropriate  Motor: Normal  Speech/Language:  Normal Rate  Affect: Appropriate  Mood: normal  Thought process: normal  Thought content:   WNL  Sensory/Perceptual disturbances:   WNL  Orientation: oriented to person, place, time/date, and situation  Attention: Good  Concentration: Good  Memory: WNL  Fund of knowledge:  Good  Insight:   Good  Judgment:  Good  Impulse Control: Good   Risk Assessment: Danger to Self:  No Self-injurious Behavior: No Danger to Others: No Duty to Warn:no Physical Aggression / Violence:No  Access to Firearms a concern: No  Gang Involvement:No   Subjective: Pt present for face-to-face individual therapy via video.  Pt consents to telehealth video session and is aware of limitations and benefits of virtual session.  Location of pt: home Location of therapist: home office.   Pt talked about    Pt has not had contact with the baby's father Marijean Shouts.   Marijean Shouts is wanting a paternity test.   Pt does not think that he will be very involved with the baby even though he is the father.  Pt is sad their relationship is not like it use to be when they were close.       Pt talked about work.  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    Worked on self care strategies. Provided supportive therapy.   Interventions: Cognitive Behavioral Therapy and Insight-Oriented  Diagnosis: F43.23  Plan of Care: Recommend ongoing therapy.   Pt participated in setting treatment goals.   Pt wants to improve coping skills and cope without using weed or getting angry.   Pt wants to manage stress better.    Plan to meet monthly.   Pt agrees with treatment plan.     Treatment Plan (Treatment Plan Target Date:  02/20/2024) Client Abilities/Strengths  Pt is bright, engaging, and motivated for therapy.  Client Treatment Preferences  Individual therapy.  Client Statement of Needs  Improve copings skills and understand herself better. Improve self esteem.  Symptoms  Depressed or irritable mood. Excessive and/or unrealistic worry that is difficult to control occurring more days than not for at least 6 months about a number of events or activities. Hypervigilance (e.g., feeling constantly on edge, experiencing concentration difficulties, having trouble falling or staying asleep, exhibiting a general state of irritability). Low self-esteem. Problems Addressed  Unipolar Depression, Anxiety Goals 1. Alleviate depressive symptoms and return to previous level of effective functioning. 2. Appropriately grieve the loss in order to normalize mood and to return to previously adaptive level of functioning. Objective Learn and implement behavioral strategies to overcome depression. Target Date: 2024-02-20 Frequency: Monthly  Progress: 30 Modality: individual  Related Interventions Assist the client in developing skills that increase the likelihood of deriving pleasure from behavioral activation (e.g., assertiveness skills, developing an exercise plan, less internal/more external focus, increased social involvement); reinforce success. Engage the client in "behavioral activation," increasing his/her activity level and contact with sources of reward, while identifying processes that inhibit activation. use behavioral techniques such as instruction, rehearsal, role-playing, role reversal, as needed, to facilitate activity in the client's daily life; reinforce success. 3. Develop healthy interpersonal relationships that lead to the alleviation and help  prevent the relapse of depression. 4. Develop healthy thinking patterns and beliefs about self, others, and the world that  lead to the alleviation and help prevent the relapse of depression. 5. Enhance ability to effectively cope with the full variety of life's worries and anxieties. 6. Learn and implement coping skills that result in a reduction of anxiety and worry, and improved daily functioning. Objective Learn and implement problem-solving strategies for realistically addressing worries. Target Date: 2024-02-20 Frequency: Monthly  Progress: 30 Modality: individual  Related Interventions Assign the client a homework exercise in which he/she problem-solves a current problem (see Mastery of Your Anxiety and Worry: Workbook by Colbert Dates and Edna Gouty or Generalized Anxiety Disorder by Woodson He, and Edna Gouty); review, reinforce success, and provide corrective feedback toward improvement. Teach the client problem-solving strategies involving specifically defining a problem, generating options for addressing it, evaluating the pros and cons of each option, selecting and implementing an optional action, and reevaluating and refining the action. Objective Learn and implement calming skills to reduce overall anxiety and manage anxiety symptoms. Target Date: 2024-02-20 Frequency: Monthly  Progress: 30 Modality: individual  Related Interventions Assign the client to read about progressive muscle relaxation and other calming strategies in relevant books or treatment manuals (e.g., Progressive Relaxation Training by Juleen Oakland; Mastery of Your Anxiety and Worry: Workbook by Rodney Clamp). Assign the client homework each session in which he/she practices relaxation exercises daily, gradually applying them progressively from non-anxiety-provoking to anxiety-provoking situations; review and reinforce success while providing corrective feedback toward improvement. Teach the client calming/relaxation skills (e.g., applied relaxation, progressive muscle relaxation, cue controlled relaxation; mindful breathing;  biofeedback) and how to discriminate better between relaxation and tension; teach the client how to apply these skills to his/her daily life. 7. Recognize, accept, and cope with feelings of depression. 8. Reduce overall frequency, intensity, and duration of the anxiety so that daily functioning is not impaired. 9. Resolve the core conflict that is the source of anxiety. 10. Stabilize anxiety level while increasing ability to function on a daily basis. Diagnosis F43.23 Conditions For Discharge Achievement of treatment goals and objectives   Willey Harrier, LCSW

## 2023-11-11 ENCOUNTER — Ambulatory Visit (INDEPENDENT_AMBULATORY_CARE_PROVIDER_SITE_OTHER): Admitting: Psychology

## 2023-11-11 DIAGNOSIS — F4323 Adjustment disorder with mixed anxiety and depressed mood: Secondary | ICD-10-CM | POA: Diagnosis not present
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
## 2024-01-28 ENCOUNTER — Ambulatory Visit: Admitting: Psychology

## 2024-03-04 ENCOUNTER — Ambulatory Visit: Payer: Self-pay

## 2024-03-04 ENCOUNTER — Telehealth: Admitting: Family Medicine

## 2024-03-04 ENCOUNTER — Encounter: Payer: Self-pay | Admitting: Family Medicine

## 2024-03-04 ENCOUNTER — Other Ambulatory Visit (HOSPITAL_BASED_OUTPATIENT_CLINIC_OR_DEPARTMENT_OTHER): Payer: Self-pay

## 2024-03-04 DIAGNOSIS — F411 Generalized anxiety disorder: Secondary | ICD-10-CM | POA: Diagnosis not present

## 2024-03-04 DIAGNOSIS — F32A Depression, unspecified: Secondary | ICD-10-CM

## 2024-03-04 MED ORDER — CITALOPRAM HYDROBROMIDE 20 MG PO TABS
20.0000 mg | ORAL_TABLET | Freq: Every day | ORAL | 1 refills | Status: AC
  Filled 2024-03-04: qty 90, 90d supply, fill #0
  Filled 2024-07-07: qty 90, 90d supply, fill #1

## 2024-03-04 NOTE — Progress Notes (Signed)
 Virtual Video Visit via MyChart Note  I connected with  Barbara Lutz on 03/04/24 at  2:30 PM EDT by the video enabled telemedicine application for MyChart, and verified that I am speaking with the correct person using two identifiers.   I introduced myself as a Publishing rights manager with the practice. We discussed the limitations of evaluation and management by telemedicine and the availability of in person appointments. The patient expressed understanding and agreed to proceed.  Participating parties in this visit include: The patient and the nurse practitioner listed.  The patient is: At home I am: In the office - [facility]  Subjective:    CC:  Chief Complaint  Patient presents with   Depression    HPI: Barbara Lutz is a 28 y.o. year old female presenting today via MyChart today for mood concerns.   Discussed the use of AI scribe software for clinical note transcription with the patient, who gave verbal consent to proceed.  History of Present Illness Barbara Lutz is a 28 year old female who presents with symptoms suggestive of postpartum depression.  She has been experiencing mood swings, irritability, overstimulation, and difficulty calming down, which have intensified over the past month, approximately six months postpartum. She describes feeling 'moody' and having difficulty managing her emotions, often feeling angry or overly fixated on tasks.  She experiences significant fatigue and difficulty sleeping - baby waking her every two to three hours at night. Extra sleep does not alleviate her symptoms, and she often feels overwhelmed and unable to relax. She sometimes naps during the day but desires more personal time in the evenings.  She feels isolated. She relies heavily on her parents for support but feels guilty asking for more help.  Her eating habits are irregular, with periods of binge eating and days where she does  not eat much at all. She has not noticed significant weight changes. She feels that her mental state is not improving despite attempts to rest and take breaks.  She has a history of using Celexa , which was effective in the past, but she stopped taking it. She also tried Buspar  for anxiety but did not notice significant improvement. She is not currently breastfeeding, which allows for more medication options.  She is concerned about her mental health, noting that her symptoms have worsened over the last three months. Her PMS is particularly severe, with mood swings that make her feel like a 'devil'. No thoughts of self-harm, but she sometimes feels overwhelmed.  Her baby, 38 old, has irregular sleep patterns and feeding habits. He wakes frequently at night and does not consistently finish his bottles. She has started him on purees to help with satiety. He sleeps better when in contact with her, which affects her sleep quality.        03/04/2024    2:17 PM 06/03/2023    8:45 AM 01/29/2023   10:31 AM  PHQ9 SCORE ONLY  PHQ-9 Total Score 20 6  4       Data saved with a previous flowsheet row definition      03/04/2024    2:17 PM 06/03/2023    8:45 AM 01/29/2023   10:31 AM 07/26/2022   12:42 PM  GAD 7 : Generalized Anxiety Score  Nervous, Anxious, on Edge 3 2 1  0  Control/stop worrying 3 2 1  0  Worry too much - different things 3 2 0 0  Trouble relaxing 3 1 0 0  Restless 0 0 0 0  Easily annoyed or irritable 3 1 1  0  Afraid - awful might happen 0 0 0 0  Total GAD 7 Score 15 8 3  0  Anxiety Difficulty Somewhat difficult   Not difficult at all          Past medical history, Surgical history, Family history not pertinant except as noted below, Social history, Allergies, and medications have been entered into the medical record, reviewed, and corrections made.   Review of Systems:  All review of systems negative except what is listed in the HPI   Objective:    General:   Speaking clearly in complete sentences. Absent shortness of breath noted.   Alert and oriented x3.   Normal judgment.  Absent acute distress.   Impression and Recommendations:    Problem List Items Addressed This Visit       Active Problems   GAD (generalized anxiety disorder) - Primary (Chronic) Depression, unspecified depression type     No SI/HI. PHQ/GAD reviewed. - Restart Citalopram  at lowest dose, monitor for a month. - Explore counseling services through pediatrician's office or support groups. - Contact previous therapist for counseling services, verify insurance coverage.      Relevant Medications   citalopram  (CELEXA ) 20 MG tablet            Follow-up if symptoms worsen or fail to improve.    I discussed the assessment and treatment plan with the patient. The patient was provided an opportunity to ask questions and all were answered. The patient agreed with the plan and demonstrated an understanding of the instructions.   The patient was advised to call back or seek an in-person evaluation if the symptoms worsen or if the condition fails to improve as anticipated.   [name] Mon, NP

## 2024-03-04 NOTE — Patient Instructions (Incomplete)

## 2024-03-04 NOTE — Assessment & Plan Note (Signed)
-   Restart Citalopram  at lowest dose, monitor for a month. - Explore counseling services through pediatrician's office or support groups. - Contact previous therapist for counseling services, verify insurance coverage.

## 2024-03-04 NOTE — Telephone Encounter (Signed)
 FYI Only or Action Required?: FYI only for provider.  Patient was last seen in primary care on 05/06/2023 by Daryl Setter, NP.  Called Nurse Triage reporting No chief complaint on file..  Symptoms began post delivery.  Interventions attempted: Nothing.  Symptoms are: gradually worsening.  Triage Disposition: See PCP When Office is Open (Within 3 Days)  Patient/caregiver understands and will follow disposition?: Yes Reason for Disposition  Depression symptoms occurring > 1 month after delivery  Answer Assessment - Initial Assessment Questions Patient states that she has began experiencing depression and anxiety since giving birth and is effecting her ability to sleep.   Pt made an appt for 03/08/24 online and is on waitlist for a sooner appt.   Infant is 71 months old. Advised patient to place the baby in the crib and call 911 with any onset of SI or thoughts of harming the baby or other. Pt verbalized understanding.  This RN contacted [facility] to request same day in person or virtual visit. Was advised that patient's PCP is leaving early today and she recommended scheduling a virtual with NP tomorrow. This RN contacted pt and scheduled her. Kept Monday's appt in case she requires follow up.   1. REASON FOR CALL: What is your main concern right now?     Post partum depression and anxiety, states has hx of same and would like to restart medication  2. ONSET:      Hx of chronic anxiety and depression, continues to experience post delivery  3. SEVERITY:      Effecting ability to sleep  Answer Assessment - Initial Assessment Questions Patient states that she has began experiencing depression and anxiety since giving birth and is effecting her ability to sleep.   Pt made an appt for 03/08/24 online and is on waitlist for a sooner appt.   Infant is 47 months old. Advised patient to place the baby in the crib and call 911 with any onset of SI or thoughts of harming the baby or other.  Pt verbalized understanding.  This RN contacted [facility] to request same day in person or virtual visit. Was advised that patient's PCP is leaving early today and she recommended scheduling a virtual with NP tomorrow. This RN contacted pt and scheduled her. Kept Monday's appt in case she requires follow up.   CONCERN: What happened that made you call today?     Post partum depression and anxiety, states has hx of same and would like to restart medication  DEPRESSION SYMPTOM SCREENING: How are you feeling overall? (e.g., decreased energy, increased sleeping or difficulty sleeping, difficulty concentrating, feelings of sadness, guilt, hopelessness, or worthlessness; problems caring for baby)     Depression and anxiety that is effecting ability to sleep  RISK OF HARM - SUICIDAL IDEATION:  Do you ever have thoughts of hurting yourself or the baby?  (e.g., yes, no; details).     Denies   RISK OF HARM - HOMICIDAL IDEATION:  Do you ever have thoughts of hurting or killing someone else?  (e.g., yes, no, no but preoccupation with thoughts about death)     Denies  SUPPORT: Who is with you now? Who do you live with? Do you have family or friends who you can talk to, or come to help you with the baby?      States has good support system   OTHER: Do you have any other physical symptoms right now? (e.g., fever)       Denies  DELIVERY  DATE: When was your delivery date?        6 months ago  Protocols used: No Guideline Available-A-AH, Postpartum - Depression-A-AH Copied from CRM [id number]. Topic: Clinical - Red Word Triage >> Mar 04, 2024  9:59 AM Drema MATSU wrote: Red Word that prompted transfer to Nurse Triage: Patient is having postpartum depression and anxiety.

## 2024-03-04 NOTE — Progress Notes (Deleted)
 MyChart Video Visit    Virtual Visit via Video Note    Patient location: Home. Patient and provider in visit Provider location: Office  I discussed the limitations of evaluation and management by telemedicine and the availability of in person appointments. The patient expressed understanding and agreed to proceed.  Visit Date: 03/05/2024  Today's healthcare provider: Harlene LITTIE Jolly, NP     Subjective:    Patient ID: Barbara Lutz, female    DOB: 10-Jul-1995, 28 y.o.   MRN: [id number]  No chief complaint on file.   HPI  Subjective Barbara Lutz  is an 28 y.o. female who presents with anxiety/depression. She has a Hx of GAD and was previously managed with Celexa  and Buspar .  Symptoms began {gen onset/course:315708}. Anxiety symptoms: {Symptoms; anxiety:15334}. Depressive symptoms {DEPRESSION SYMPTOMS:20000}. Family history significant for {Family Hx:15335}. Possible organic causes contributing are: {Causes; depression:1005} Social stressors include {PSYCHOSOCIAL HISTORY:21023002}. She is currently being treated with {DEPRESSION TREATMENT:20002} and has failed {DEPRESSION TREATMENT:20002}. She {ACTION; IS/IS WNU:78978602} following with a psychologist.    Past Medical History:  Diagnosis Date   Anxiety    COVID-19    Depression    Depression, major, recurrent, moderate (HCC) 05/25/2012   Dysmenorrhea 03/12/2022   Gastroenteritis 04/22/2023   History of pyelonephritis 07/23/2018   History of pyelonephritis 07/23/2018   LGA (large for gestational age) fetus affecting management of mother 05/05/2023   Mucocele of lip 01/2020   Nicotine dependence, cigarettes, uncomplicated 07/23/2018   Obesity (BMI 30-39.9) 07/23/2018   [facility]   Pityriasis versicolor 03/19/2021   Recurrent UTI 07/23/2018   Sore throat 05/06/2023   Thyroid  disorder     Past Surgical History:  Procedure Laterality Date   CESAREAN SECTION N/A 08/19/2023    Procedure: CESAREAN DELIVERY;  Surgeon: Eveline Lynwood MATSU, MD;  Location: [facility];  Service: Obstetrics;  Laterality: N/A;   ORAL MUCOCELE EXCISION      Family History  Problem Relation Age of Onset   COPD Mother    Hypertension Mother    Hypertension Father    Heart attack Father    High Cholesterol Father    Depression Sister    Asthma Sister    Depression Sister    Drug abuse Sister    Breast cancer Maternal Grandmother    Diabetes Maternal Grandfather    Hearing loss Maternal Grandfather    Heart attack Maternal Grandfather    Heart disease Maternal Grandfather    Hypertension Maternal Grandfather    Hyperlipidemia Maternal Grandfather    Pancreatic cancer Paternal Grandmother    Arthritis Paternal Grandmother    Hypertension Paternal Grandmother    ADD / ADHD Paternal Grandfather    Alcohol abuse Paternal Grandfather    COPD Paternal Grandfather    Diabetes Paternal Grandfather    Hearing loss Paternal Grandfather    Hyperlipidemia Paternal Grandfather    Hypertension Paternal Grandfather    Stroke Paternal Grandfather     Social History   Socioeconomic History   Marital status: Single    Spouse name: Not on file   Number of children: Not on file   Years of education: Not on file   Highest education level: GED or equivalent  Occupational History   Not on file  Tobacco Use   Smoking status: Former    Types: Cigarettes   Smokeless tobacco: Never   Tobacco comments:    Cutting back on vaping  Vaping Use   Vaping status: Some Days  Substances: Nicotine  Substance and Sexual Activity   Alcohol use: Not Currently    Comment: social   Drug use: No   Sexual activity: Not Currently    Partners: Male    Birth control/protection: None  Other Topics Concern   Not on file  Social History Narrative   Marital status/children/pets: Single.   Education/employment: GED.  Employed. Manager.   Safety:      -smoke alarm in the home:Yes     - wears seatbelt: Yes      - Feels safe in their relationships: Yes   Social Drivers of Corporate investment banker Strain: Low Risk  (12/26/2022)   Overall Financial Resource Strain (CARDIA)    Difficulty of Paying Living Expenses: Not very hard  Food Insecurity: No Food Insecurity (08/17/2023)   Hunger Vital Sign    Worried About Running Out of Food in the Last Year: Never true    Ran Out of Food in the Last Year: Never true  Transportation Needs: No Transportation Needs (08/17/2023)   PRAPARE - Administrator, Civil Service (Medical): No    Lack of Transportation (Non-Medical): No  Physical Activity: Insufficiently Active (12/26/2022)   Exercise Vital Sign    Days of Exercise per Week: 3 days    Minutes of Exercise per Session: 30 min  Stress: No Stress Concern Present (12/26/2022)   [employer] of Occupational Health - Occupational Stress Questionnaire    Feeling of Stress : Only a little  Social Connections: Moderately Isolated (12/26/2022)   Social Connection and Isolation Panel    Frequency of Communication with Friends and Family: More than three times a week    Frequency of Social Gatherings with Friends and Family: More than three times a week    Attends Religious Services: More than 4 times per year    Active Member of [employer] or Organizations: No    Attends Engineer, structural: Not on file    Marital Status: Never married  Intimate Partner Violence: Not At Risk (08/17/2023)   Humiliation, Afraid, Rape, and Kick questionnaire    Fear of Current or Ex-Partner: No    Emotionally Abused: No    Physically Abused: No    Sexually Abused: No    Outpatient Medications Prior to Visit  Medication Sig Dispense Refill   busPIRone  (BUSPAR ) 5 MG tablet Take 1 tablet (5 mg total) by mouth 2 (two) times daily. 60 tablet 3   citalopram  (CELEXA ) 40 MG tablet Take 1 tablet (40 mg total) by mouth daily. 90 tablet 3   No facility-administered medications prior to visit.    Allergies   Allergen Reactions   Latex Rash    More adhesive than latex- causes redness   Nickel Dermatitis    ROS See HPI    Objective:    Physical Exam  General: Patient appears well-nourished and in no acute distress. Alert and oriented to person and place. Engaged with provider throughout visit  HEENT: Head atraumatic and normocephalic. No facial asymmetry.. Pupils appear equal and reactive.  Respiratory: Breathing unlabored. No cough, wheezing, or respiratory distress observed. Respiratory rate appears normal. Neurological: Patient alert and oriented. Speech coherent. No facial droop, slurred speech, or focal deficits observed. Psychiatric: Mood and affect assessed via video. Patient cooperative. Thought process logical/coherent***   There were no vitals taken for this visit. Wt Readings from Last 3 Encounters:  10/01/23 187 lb (84.8 kg)  08/17/23 211 lb 6 oz (95.9 kg)  08/12/23  208 lb (94.3 kg)       Assessment & Plan:   Problem List Items Addressed This Visit     GAD (generalized anxiety disorder) - Primary (Chronic)   Restart Celexa  10 mg x 1 week, increase to 2 tabs (20 mg) daily thereafter. Counseled on adjunctive treatment with exercise/physical activity. Encouraged therapy/counseling; services listed in AVS Number for [facility] Counseling provided in AVS. F/u in 1 month with PCP Patient voiced understanding and agreement to the plan.   I am having Barbara BROCKS Capers maintain her citalopram  and busPIRone .  No orders of the defined types were placed in this encounter.   I discussed the assessment and treatment plan with the patient. The patient was provided an opportunity to ask questions and all were answered. The patient agreed with the plan and demonstrated an understanding of the instructions.   The patient was advised to call back or seek an in-person evaluation if the symptoms worsen or if the condition fails to improve as anticipated.  I  provided *** minutes of face-to-face time during this encounter.   Harlene LITTIE Jolly, NP [facility] at [facility] [phone] (phone) [phone] (fax)  [facility]

## 2024-03-05 ENCOUNTER — Ambulatory Visit: Admitting: Student

## 2024-03-08 ENCOUNTER — Ambulatory Visit: Admitting: Family Medicine

## 2024-04-08 ENCOUNTER — Encounter: Payer: Self-pay | Admitting: Family Medicine

## 2024-04-08 ENCOUNTER — Telehealth: Admitting: Family Medicine

## 2024-04-08 ENCOUNTER — Encounter: Payer: Self-pay | Admitting: Neurology

## 2024-04-08 DIAGNOSIS — F419 Anxiety disorder, unspecified: Secondary | ICD-10-CM

## 2024-04-08 DIAGNOSIS — F32A Depression, unspecified: Secondary | ICD-10-CM | POA: Diagnosis not present

## 2024-04-08 NOTE — Progress Notes (Signed)
 Virtual Video Visit via MyChart Note  I connected with  Barbara Lutz on 04/08/24 at  8:40 AM EDT by the video enabled telemedicine application for MyChart, and verified that I am speaking with the correct person using two identifiers.   I introduced myself as a Publishing rights manager with the practice. We discussed the limitations of evaluation and management by telemedicine and the availability of in person appointments. The patient expressed understanding and agreed to proceed.  Participating parties in this visit include: The patient and the nurse practitioner listed.  The patient is: At home I am: In the office - [facility]  Subjective:    CC:  Chief Complaint  Patient presents with   Follow-up    HPI: Barbara Lutz is a 28 y.o. year old female presenting today via MyChart today for mood.   Mood follow-up: - Diagnosis: anxiety and depression - Treatment: Celexa  20 mg daily - Medication side effects: possible weight gain - SI/HI: no - Update: She has noticed improvement in her mood, having less bad days and less overwhelming anxiety on a daily basis. She has noticed a little bit of weight gain (current home weight 182 pounds), but unsure if still hormonal/postpartum related or med related, states she has been snacking a lot and no regular exercise).   Wt Readings from Last 3 Encounters:  10/01/23 187 lb (84.8 kg)  08/17/23 211 lb 6 oz (95.9 kg)  08/12/23 208 lb (94.3 kg)         04/08/2024    9:20 AM 03/04/2024    2:17 PM 06/03/2023    8:45 AM  PHQ9 SCORE ONLY  PHQ-9 Total Score 11 20 6       Data saved with a previous flowsheet row definition      04/08/2024    9:21 AM 03/04/2024    2:17 PM 06/03/2023    8:45 AM 01/29/2023   10:31 AM  GAD 7 : Generalized Anxiety Score  Nervous, Anxious, on Edge 1 3 2 1   Control/stop worrying 1 3 2 1   Worry too much - different things 1 3 2  0  Trouble relaxing 1 3 1  0  Restless 0  0 0 0  Easily annoyed or irritable 1 3 1 1   Afraid - awful might happen 0 0 0 0  Total GAD 7 Score 5 15 8 3   Anxiety Difficulty  Somewhat difficult         Past medical history, Surgical history, Family history not pertinant except as noted below, Social history, Allergies, and medications have been entered into the medical record, reviewed, and corrections made.   Review of Systems:  All review of systems negative except what is listed in the HPI   Objective:    General:  Speaking clearly in complete sentences. Absent shortness of breath noted.   Alert and oriented x3.   Normal judgment.  Absent acute distress.   Impression and Recommendations:    Problem List Items Addressed This Visit   None Visit Diagnoses       Anxiety and depression    -  Primary PHQ9/GAD7 reviewed. No SI/HI. Mood is improving on Celexa , but some possible weight gain. Discussed options with patient. She would like to go ahead and increase to 1.5 tabs (30 mg) Celexa  daily and will closely watch her weight. She is planning to start consistent exercise routine. Also discussed eating more high-protein food and less carbs. She will monitor for about a  month and let me know if weight is able to hold steady. She has done well on Celexa  in the past without weight issues.           Follow-up if symptoms worsen or fail to improve. Mood follow-up 4-6 weeks.    I discussed the assessment and treatment plan with the patient. The patient was provided an opportunity to ask questions and all were answered. The patient agreed with the plan and demonstrated an understanding of the instructions.   The patient was advised to call back or seek an in-person evaluation if the symptoms worsen or if the condition fails to improve as anticipated.   [name] Mon, NP

## 2024-04-12 ENCOUNTER — Ambulatory Visit: Admitting: Family Medicine

## 2024-04-13 ENCOUNTER — Encounter: Payer: Self-pay | Admitting: Family Medicine

## 2024-04-13 ENCOUNTER — Other Ambulatory Visit (HOSPITAL_BASED_OUTPATIENT_CLINIC_OR_DEPARTMENT_OTHER): Payer: Self-pay

## 2024-04-13 ENCOUNTER — Ambulatory Visit: Admitting: Family Medicine

## 2024-04-13 VITALS — BP 122/74 | HR 100 | Temp 98.0°F | Resp 16 | Ht 62.0 in | Wt 172.4 lb

## 2024-04-13 DIAGNOSIS — J029 Acute pharyngitis, unspecified: Secondary | ICD-10-CM | POA: Diagnosis not present

## 2024-04-13 MED ORDER — FLUCONAZOLE 150 MG PO TABS
ORAL_TABLET | ORAL | 0 refills | Status: AC
  Filled 2024-04-13: qty 2, 6d supply, fill #0

## 2024-04-13 MED ORDER — PENICILLIN G BENZATHINE & PROC 1200000 UNIT/2ML IM SUSP
1.2000 10*6.[IU] | Freq: Once | INTRAMUSCULAR | Status: AC
  Administered 2024-04-13: 1.2 10*6.[IU] via INTRAMUSCULAR

## 2024-04-13 NOTE — Progress Notes (Signed)
 Chief Complaint  Patient presents with   Sore Throat    Sore Throat     Barbara Lutz here for URI complaints.  Duration: 1 day  Associated symptoms: sinus congestion, rhinorrhea, ear pain, ear drainage, and sore throat Denies: sinus pain, itchy watery eyes, wheezing, shortness of breath, myalgia, and fevers Treatment to date: OTC cold med Sick contacts: Yes- nephew  Past Medical History:  Diagnosis Date   Anxiety    COVID-19    Depression    Depression, major, recurrent, moderate (HCC) 05/25/2012   Dysmenorrhea 03/12/2022   Gastroenteritis 04/22/2023   History of pyelonephritis 07/23/2018   History of pyelonephritis 07/23/2018   LGA (large for gestational age) fetus affecting management of mother 05/05/2023   Mucocele of lip 01/2020   Nicotine dependence, cigarettes, uncomplicated 07/23/2018   Obesity (BMI 30-39.9) 07/23/2018   [facility]   Pityriasis versicolor 03/19/2021   Recurrent UTI 07/23/2018   Sore throat 05/06/2023   Thyroid  disorder     Objective BP 122/74 (BP Location: Left Arm, Patient Position: Sitting)   Pulse 100   Temp 98 F (36.7 C) (Oral)   Resp 16   Ht 5' 2 (1.575 m)   Wt 172 lb 6.4 oz (78.2 kg)   LMP 03/10/2024   SpO2 99%   BMI 31.53 kg/m  General: Awake, alert, appears stated age HEENT: AT, [place], ears patent b/l and TM's neg, nares patent w/o discharge, tonsils are erythematous and with bilateral exudates, no asymmetry, MMM, no sinus TTP Neck: No masses or asymmetry, + TTP over cervical lymph nodes Heart: RRR Lungs: CTAB, no accessory muscle use Psych: Age appropriate judgment and insight, normal mood and affect  Sore throat  1 shot of IM penicillin  1,200,000 units.  3/4 Centor criteria.  Diflucan  sent should she need it.  Continue to push fluids, practice good hand hygiene, cover mouth when coughing.  Replace toothbrush after 24 hours of the antibiotic. F/u prn. If starting to experience fevers, shaking, or shortness  of breath, seek immediate care. Pt voiced understanding and agreement to the plan.  Mabel [place], DO 04/13/24 10:56 AM

## 2024-04-13 NOTE — Patient Instructions (Addendum)
 OK to take Tylenol  1000 mg (2 extra strength tabs) or 975 mg (3 regular strength tabs) every 6 hours as needed.  Ibuprofen  400-600 mg (2-3 over the counter strength tabs) every 6 hours as needed for pain.  Consider throat lozenges, salt water  gargles and an air humidifier for symptomatic care.   Send me a message in 2 d if not significantly better.  Replace your toothbrush/head after brushing in the morning tomorrow.   Let us  know if you need anything.

## 2024-04-13 NOTE — Addendum Note (Signed)
 Addended by: Rudie Rikard M on: 04/13/2024 11:20 AM   Modules accepted: Orders

## 2024-05-18 DIAGNOSIS — Z3201 Encounter for pregnancy test, result positive: Secondary | ICD-10-CM | POA: Diagnosis not present

## 2024-05-18 DIAGNOSIS — F418 Other specified anxiety disorders: Secondary | ICD-10-CM | POA: Diagnosis not present

## 2024-05-27 DIAGNOSIS — Z3201 Encounter for pregnancy test, result positive: Secondary | ICD-10-CM | POA: Diagnosis not present

## 2024-07-08 ENCOUNTER — Other Ambulatory Visit (HOSPITAL_BASED_OUTPATIENT_CLINIC_OR_DEPARTMENT_OTHER): Payer: Self-pay
